# Patient Record
Sex: Male | Born: 1935 | Race: White | Hispanic: No | Smoking: Never smoker
Health system: Southern US, Community
[De-identification: ages and names within clinical notes are randomized; demographics above are authoritative.]

## PROBLEM LIST (undated history)

## (undated) DIAGNOSIS — Z950 Presence of cardiac pacemaker: Secondary | ICD-10-CM

## (undated) DIAGNOSIS — I4891 Unspecified atrial fibrillation: Principal | ICD-10-CM

## (undated) DIAGNOSIS — I10 Essential (primary) hypertension: Secondary | ICD-10-CM

## (undated) DIAGNOSIS — Z21 Asymptomatic human immunodeficiency virus [HIV] infection status: Secondary | ICD-10-CM

## (undated) DIAGNOSIS — D509 Iron deficiency anemia, unspecified: Secondary | ICD-10-CM

## (undated) DIAGNOSIS — E785 Hyperlipidemia, unspecified: Secondary | ICD-10-CM

## (undated) DIAGNOSIS — I701 Atherosclerosis of renal artery: Secondary | ICD-10-CM

## (undated) DIAGNOSIS — I509 Heart failure, unspecified: Secondary | ICD-10-CM

## (undated) DIAGNOSIS — E119 Type 2 diabetes mellitus without complications: Secondary | ICD-10-CM

## (undated) DIAGNOSIS — I2510 Atherosclerotic heart disease of native coronary artery without angina pectoris: Secondary | ICD-10-CM

## (undated) DIAGNOSIS — Z794 Long term (current) use of insulin: Secondary | ICD-10-CM

## (undated) DIAGNOSIS — Z8679 Personal history of other diseases of the circulatory system: Secondary | ICD-10-CM

## (undated) DIAGNOSIS — N189 Chronic kidney disease, unspecified: Secondary | ICD-10-CM

## (undated) DIAGNOSIS — I495 Sick sinus syndrome: Secondary | ICD-10-CM

## (undated) DIAGNOSIS — E039 Hypothyroidism, unspecified: Secondary | ICD-10-CM

## (undated) DIAGNOSIS — Z8719 Personal history of other diseases of the digestive system: Secondary | ICD-10-CM

## (undated) DIAGNOSIS — K2970 Gastritis, unspecified, without bleeding: Secondary | ICD-10-CM

## (undated) HISTORY — DX: Asymptomatic human immunodeficiency virus (hiv) infection status: Z21

## (undated) HISTORY — DX: Sick sinus syndrome: I49.5

## (undated) HISTORY — DX: Heart failure, unspecified: I50.9

## (undated) HISTORY — DX: Atherosclerotic heart disease of native coronary artery without angina pectoris: I25.10

## (undated) HISTORY — DX: Long term (current) use of insulin: Z79.4

## (undated) HISTORY — DX: Hyperlipidemia, unspecified: E78.5

## (undated) HISTORY — DX: Atherosclerosis of renal artery: I70.1

## (undated) HISTORY — DX: Essential (primary) hypertension: I10

## (undated) HISTORY — PX: INSERT / REPLACE / REMOVE PACEMAKER: SUR710

## (undated) HISTORY — PX: RENAL ARTERY STENT: SHX2321

## (undated) HISTORY — DX: Gastritis, unspecified, without bleeding: K29.70

## (undated) HISTORY — PX: ABLATION SAPHENOUS VEIN W/ RFA: SUR11

## (undated) HISTORY — DX: Chronic kidney disease, unspecified: N18.9

## (undated) HISTORY — DX: Type 2 diabetes mellitus without complications: E11.9

## (undated) HISTORY — DX: Personal history of other diseases of the circulatory system: Z86.79

## (undated) HISTORY — DX: Personal history of other diseases of the digestive system: Z87.19

## (undated) HISTORY — DX: Hypothyroidism, unspecified: E03.9

## (undated) HISTORY — DX: Iron deficiency anemia, unspecified: D50.9

---

## 1999-09-13 ENCOUNTER — Encounter: Payer: Self-pay | Admitting: Emergency Medicine

## 1999-09-13 ENCOUNTER — Inpatient Hospital Stay (HOSPITAL_COMMUNITY): Admission: EM | Admit: 1999-09-13 | Discharge: 1999-09-13 | Payer: Self-pay | Admitting: Emergency Medicine

## 1999-09-23 ENCOUNTER — Encounter: Admission: RE | Admit: 1999-09-23 | Discharge: 1999-09-23 | Payer: Self-pay | Admitting: Hematology and Oncology

## 1999-10-06 ENCOUNTER — Emergency Department (HOSPITAL_COMMUNITY): Admission: EM | Admit: 1999-10-06 | Discharge: 1999-10-06 | Payer: Self-pay | Admitting: Emergency Medicine

## 1999-10-20 ENCOUNTER — Encounter: Admission: RE | Admit: 1999-10-20 | Discharge: 1999-10-20 | Payer: Self-pay | Admitting: Internal Medicine

## 1999-10-28 ENCOUNTER — Encounter: Admission: RE | Admit: 1999-10-28 | Discharge: 1999-10-28 | Payer: Self-pay | Admitting: Internal Medicine

## 2000-05-20 ENCOUNTER — Emergency Department (HOSPITAL_COMMUNITY): Admission: EM | Admit: 2000-05-20 | Discharge: 2000-05-20 | Payer: Self-pay | Admitting: Emergency Medicine

## 2000-09-26 ENCOUNTER — Emergency Department (HOSPITAL_COMMUNITY): Admission: EM | Admit: 2000-09-26 | Discharge: 2000-09-26 | Payer: Self-pay | Admitting: Emergency Medicine

## 2001-10-05 ENCOUNTER — Inpatient Hospital Stay (HOSPITAL_COMMUNITY): Admission: EM | Admit: 2001-10-05 | Discharge: 2001-10-09 | Payer: Self-pay

## 2001-10-05 ENCOUNTER — Encounter: Payer: Self-pay | Admitting: *Deleted

## 2001-10-05 ENCOUNTER — Encounter: Payer: Self-pay | Admitting: Emergency Medicine

## 2001-10-14 ENCOUNTER — Encounter: Admission: RE | Admit: 2001-10-14 | Discharge: 2001-10-14 | Payer: Self-pay | Admitting: Internal Medicine

## 2001-10-17 ENCOUNTER — Encounter: Admission: RE | Admit: 2001-10-17 | Discharge: 2001-10-17 | Payer: Self-pay

## 2001-10-22 ENCOUNTER — Encounter: Admission: RE | Admit: 2001-10-22 | Discharge: 2001-10-22 | Payer: Self-pay | Admitting: Internal Medicine

## 2001-10-29 ENCOUNTER — Encounter: Admission: RE | Admit: 2001-10-29 | Discharge: 2001-10-29 | Payer: Self-pay | Admitting: Internal Medicine

## 2002-02-27 ENCOUNTER — Encounter: Admission: RE | Admit: 2002-02-27 | Discharge: 2002-02-27 | Payer: Self-pay | Admitting: Orthopedic Surgery

## 2002-02-27 ENCOUNTER — Encounter: Payer: Self-pay | Admitting: Orthopedic Surgery

## 2004-08-23 ENCOUNTER — Emergency Department (HOSPITAL_COMMUNITY): Admission: EM | Admit: 2004-08-23 | Discharge: 2004-08-24 | Payer: Self-pay | Admitting: Emergency Medicine

## 2004-09-03 ENCOUNTER — Emergency Department (HOSPITAL_COMMUNITY): Admission: EM | Admit: 2004-09-03 | Discharge: 2004-09-03 | Payer: Self-pay | Admitting: *Deleted

## 2004-09-08 ENCOUNTER — Ambulatory Visit (HOSPITAL_COMMUNITY): Admission: RE | Admit: 2004-09-08 | Discharge: 2004-09-09 | Payer: Self-pay | Admitting: Cardiology

## 2005-01-23 ENCOUNTER — Encounter: Admission: RE | Admit: 2005-01-23 | Discharge: 2005-01-23 | Payer: Self-pay | Admitting: Vascular Surgery

## 2005-02-06 ENCOUNTER — Ambulatory Visit (HOSPITAL_COMMUNITY): Admission: RE | Admit: 2005-02-06 | Discharge: 2005-02-06 | Payer: Self-pay | Admitting: Vascular Surgery

## 2005-08-23 ENCOUNTER — Encounter: Admission: RE | Admit: 2005-08-23 | Discharge: 2005-08-23 | Payer: Self-pay | Admitting: Vascular Surgery

## 2005-08-28 ENCOUNTER — Emergency Department (HOSPITAL_COMMUNITY): Admission: EM | Admit: 2005-08-28 | Discharge: 2005-08-28 | Payer: Self-pay | Admitting: Emergency Medicine

## 2005-09-30 ENCOUNTER — Emergency Department (HOSPITAL_COMMUNITY): Admission: EM | Admit: 2005-09-30 | Discharge: 2005-09-30 | Payer: Self-pay | Admitting: Emergency Medicine

## 2006-02-14 ENCOUNTER — Encounter: Admission: RE | Admit: 2006-02-14 | Discharge: 2006-02-14 | Payer: Self-pay | Admitting: Vascular Surgery

## 2006-03-03 ENCOUNTER — Inpatient Hospital Stay (HOSPITAL_COMMUNITY): Admission: EM | Admit: 2006-03-03 | Discharge: 2006-03-07 | Payer: Self-pay | Admitting: Emergency Medicine

## 2006-03-06 HISTORY — PX: CORONARY ANGIOPLASTY: SHX604

## 2006-05-14 ENCOUNTER — Encounter: Admission: RE | Admit: 2006-05-14 | Discharge: 2006-05-14 | Payer: Self-pay | Admitting: Nephrology

## 2006-07-02 ENCOUNTER — Inpatient Hospital Stay (HOSPITAL_COMMUNITY): Admission: EM | Admit: 2006-07-02 | Discharge: 2006-07-05 | Payer: Self-pay | Admitting: *Deleted

## 2006-07-02 ENCOUNTER — Encounter: Payer: Self-pay | Admitting: Cardiology

## 2006-09-20 ENCOUNTER — Ambulatory Visit (HOSPITAL_COMMUNITY): Admission: RE | Admit: 2006-09-20 | Discharge: 2006-09-20 | Payer: Self-pay | Admitting: Gastroenterology

## 2006-12-05 ENCOUNTER — Encounter: Payer: Self-pay | Admitting: Cardiology

## 2006-12-05 ENCOUNTER — Ambulatory Visit: Payer: Self-pay | Admitting: Internal Medicine

## 2006-12-05 ENCOUNTER — Inpatient Hospital Stay (HOSPITAL_COMMUNITY): Admission: EM | Admit: 2006-12-05 | Discharge: 2006-12-10 | Payer: Self-pay | Admitting: Emergency Medicine

## 2006-12-06 ENCOUNTER — Encounter: Payer: Self-pay | Admitting: Cardiology

## 2009-02-19 ENCOUNTER — Inpatient Hospital Stay (HOSPITAL_COMMUNITY): Admission: EM | Admit: 2009-02-19 | Discharge: 2009-03-05 | Payer: Self-pay | Admitting: Emergency Medicine

## 2009-02-22 HISTORY — PX: CARDIAC CATHETERIZATION: SHX172

## 2009-02-22 HISTORY — PX: CORONARY ARTERY BYPASS GRAFT: SHX141

## 2009-02-23 ENCOUNTER — Ambulatory Visit: Payer: Self-pay | Admitting: Surgery

## 2009-02-23 ENCOUNTER — Encounter: Payer: Self-pay | Admitting: Cardiology

## 2009-03-30 ENCOUNTER — Encounter: Admission: RE | Admit: 2009-03-30 | Discharge: 2009-03-30 | Payer: Self-pay | Admitting: Surgery

## 2009-03-30 ENCOUNTER — Ambulatory Visit: Payer: Self-pay | Admitting: Surgery

## 2009-04-04 ENCOUNTER — Inpatient Hospital Stay (HOSPITAL_COMMUNITY): Admission: EM | Admit: 2009-04-04 | Discharge: 2009-04-06 | Payer: Self-pay | Admitting: Emergency Medicine

## 2009-04-05 ENCOUNTER — Encounter (INDEPENDENT_AMBULATORY_CARE_PROVIDER_SITE_OTHER): Payer: Self-pay | Admitting: Interventional Cardiology

## 2009-06-09 ENCOUNTER — Inpatient Hospital Stay (HOSPITAL_COMMUNITY): Admission: EM | Admit: 2009-06-09 | Discharge: 2009-06-16 | Payer: Self-pay | Admitting: Emergency Medicine

## 2009-06-13 ENCOUNTER — Encounter: Payer: Self-pay | Admitting: Internal Medicine

## 2009-06-13 ENCOUNTER — Encounter (INDEPENDENT_AMBULATORY_CARE_PROVIDER_SITE_OTHER): Payer: Self-pay | Admitting: Gastroenterology

## 2009-07-10 ENCOUNTER — Inpatient Hospital Stay (HOSPITAL_COMMUNITY): Admission: EM | Admit: 2009-07-10 | Discharge: 2009-07-12 | Payer: Self-pay | Admitting: Emergency Medicine

## 2009-07-10 ENCOUNTER — Ambulatory Visit: Payer: Self-pay | Admitting: Internal Medicine

## 2009-07-27 ENCOUNTER — Encounter (HOSPITAL_COMMUNITY): Admission: RE | Admit: 2009-07-27 | Discharge: 2009-09-17 | Payer: Self-pay | Admitting: Nephrology

## 2009-11-03 ENCOUNTER — Encounter (HOSPITAL_COMMUNITY): Admission: RE | Admit: 2009-11-03 | Discharge: 2010-02-01 | Payer: Self-pay | Admitting: Nephrology

## 2009-12-17 IMAGING — CR DG CHEST 1V PORT
1 series · 1 of 1 positions shown · non-contrast
Comparison: 03/01/2009 at 9577 hours.

CLINICAL DATA: Unstable angina.  Repeat cardiac surgery.

PORTABLE CHEST - 1 VIEW

[view not recorded]
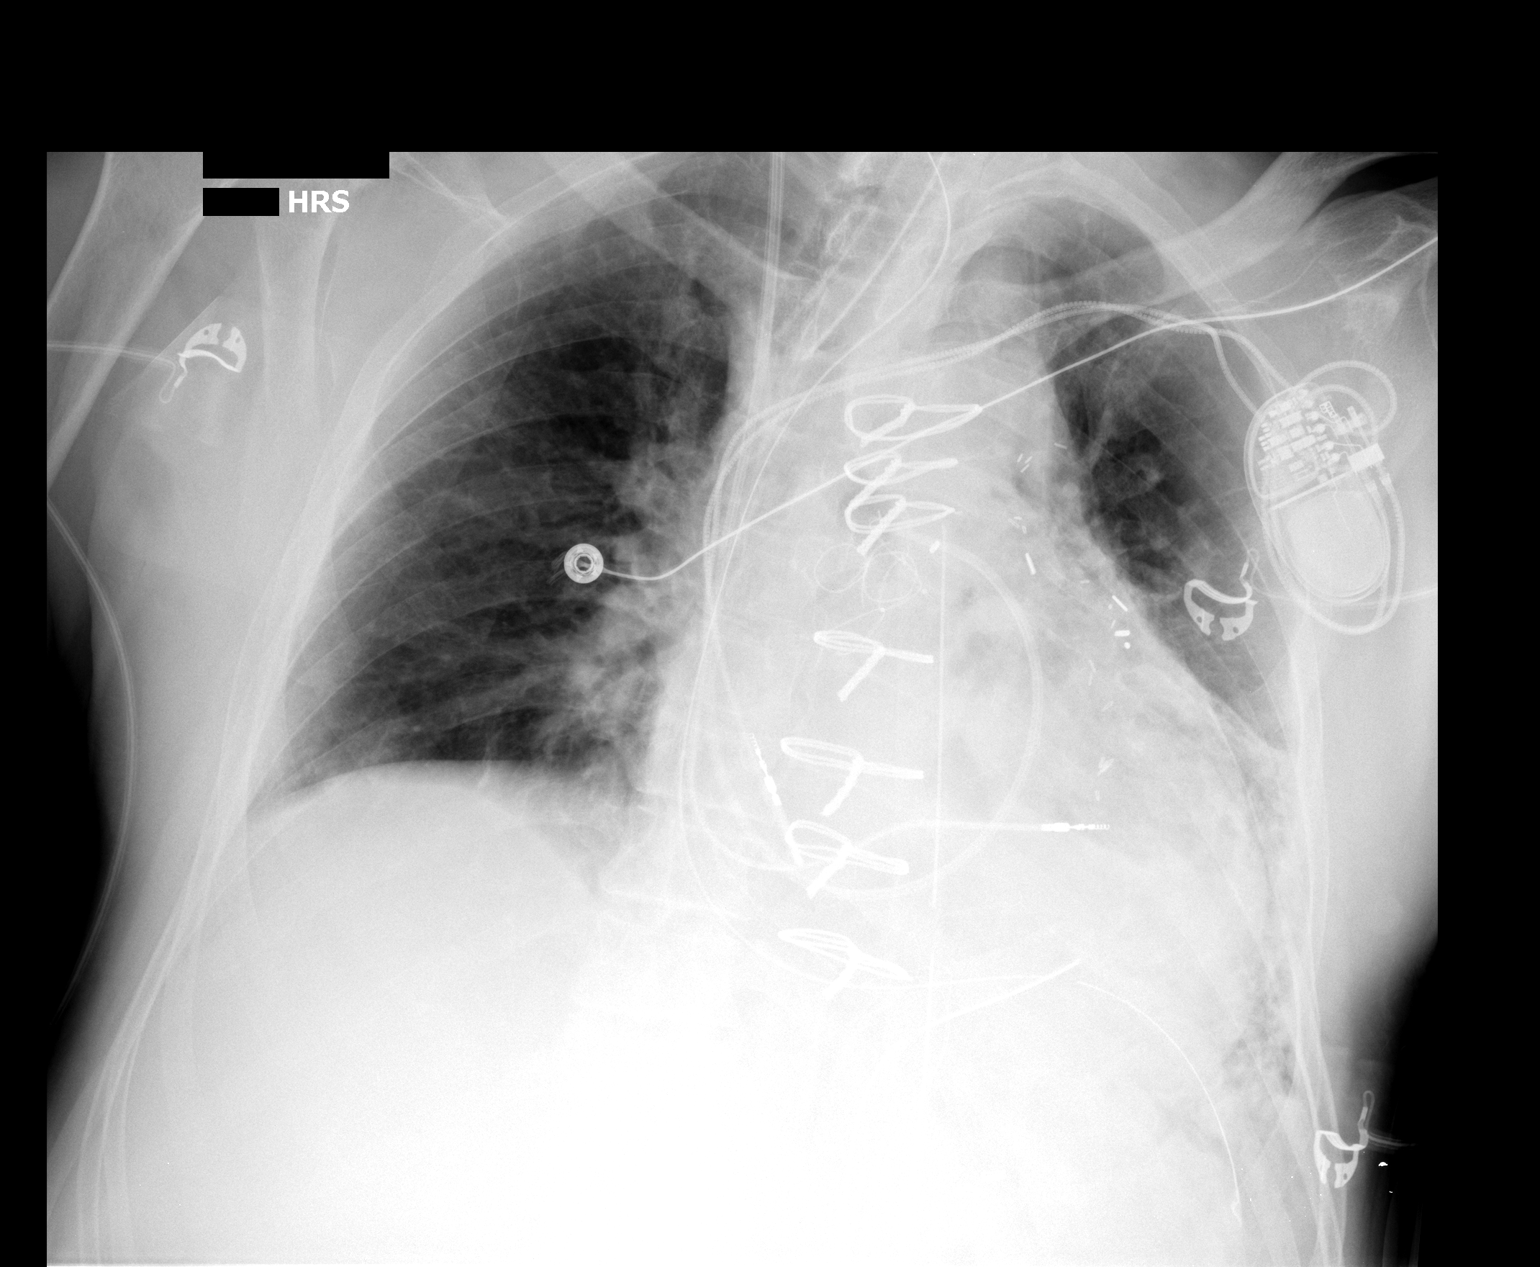

[1 of 1 positions shown; findings below may reference images not displayed]

FINDINGS: The support apparatus is stable.  No pneumothorax is
seen.  Persistent left lower lobe atelectasis.
IMPRESSION: 1.  Stable support apparatus which is in good position without
complicating features.
2.  No pneumothorax.
3.  Persistent left lower lobe atelectasis.

## 2009-12-17 IMAGING — CR DG CHEST 1V PORT
1 series · 1 of 1 positions shown · non-contrast
Comparison: 02/26/2009

CLINICAL DATA: Searching for a surgical needle.

PORTABLE CHEST - 1 VIEW

[view not recorded]
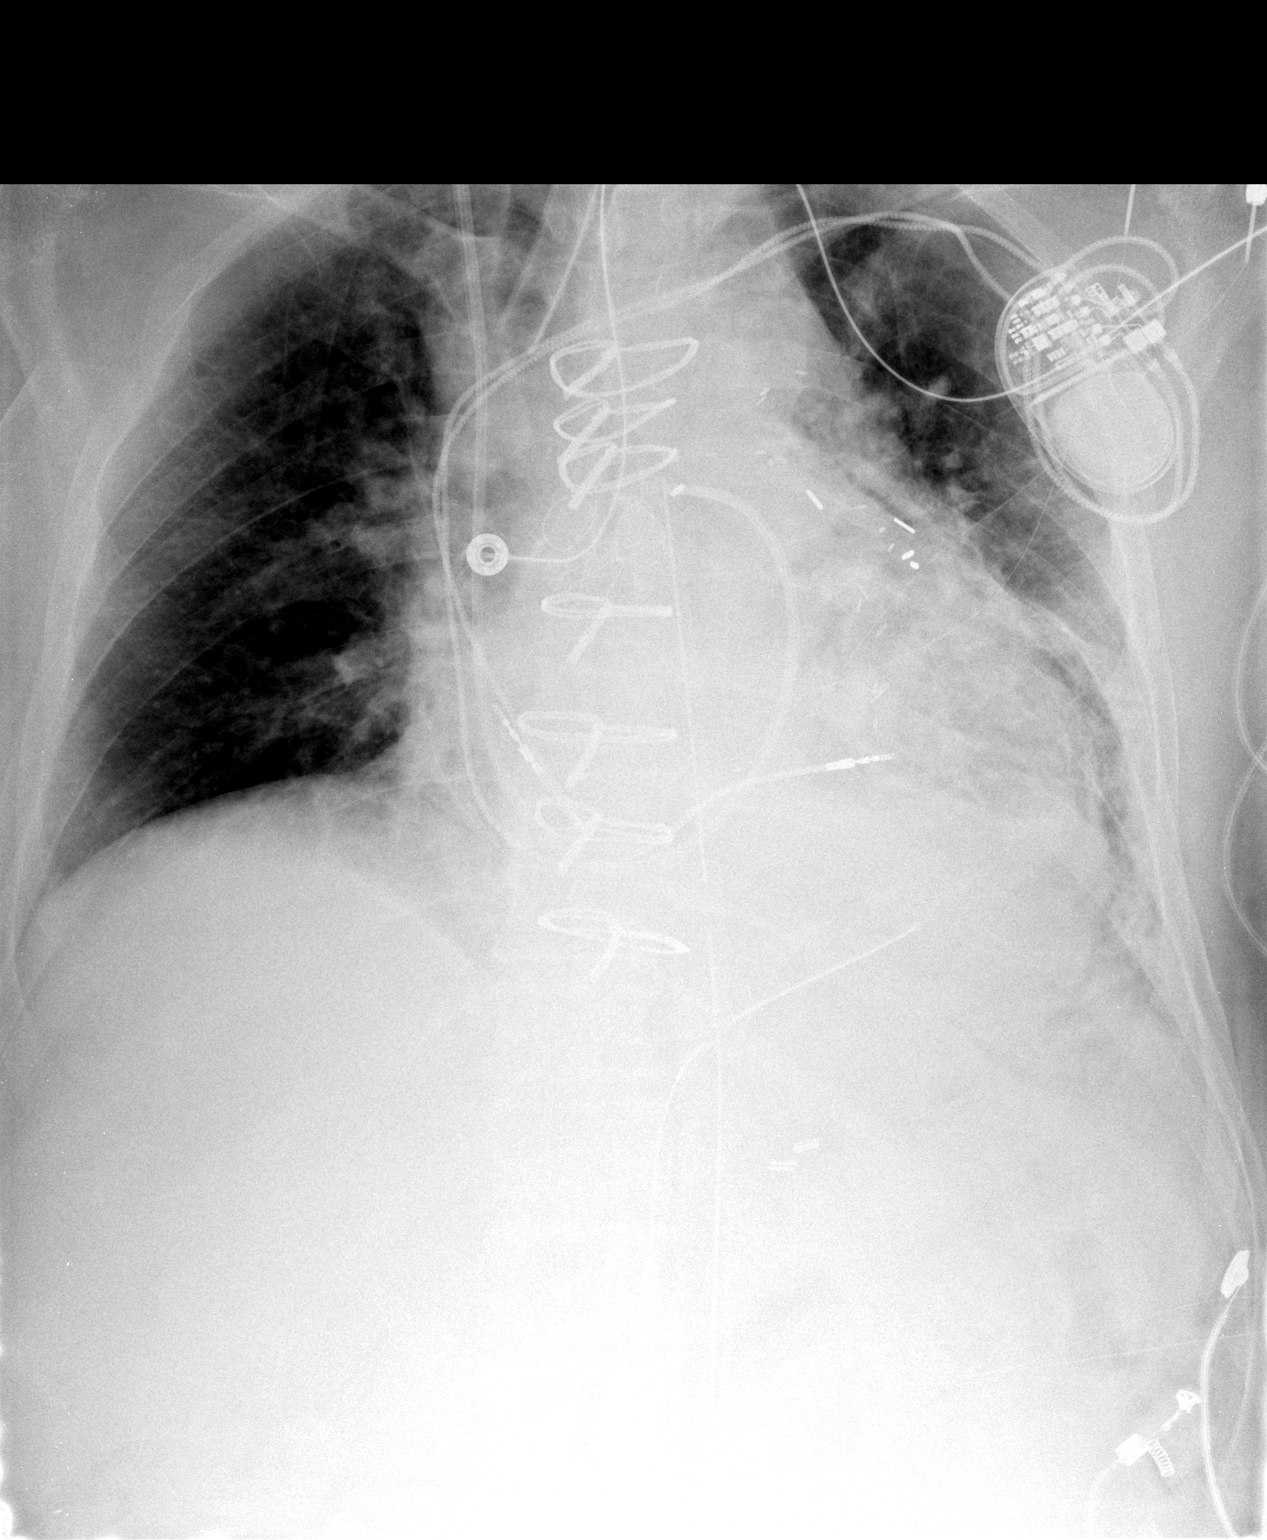

[1 of 1 positions shown; findings below may reference images not displayed]

FINDINGS: Endotracheal tube is 3.8 cm above the carina and pointing
towards the right side of the trachea.  The patient has a
mediastinal and left basilar chest tube.  Marked volume loss in the
left lung base.  The patient has a left cardiac pacemaker.  No
evidence for a radiopaque surgical needle.
IMPRESSION: No evidence for a radiopaque surgical needle.

Support apparatuses as described.

Volume loss in the left lung base.

## 2009-12-18 IMAGING — CR DG CHEST 1V PORT
1 series · 1 of 1 positions shown · non-contrast
Comparison: 03/01/2009.

CLINICAL DATA: Unstable angina.  Status post CABG.

PORTABLE CHEST - 1 VIEW

[view not recorded]
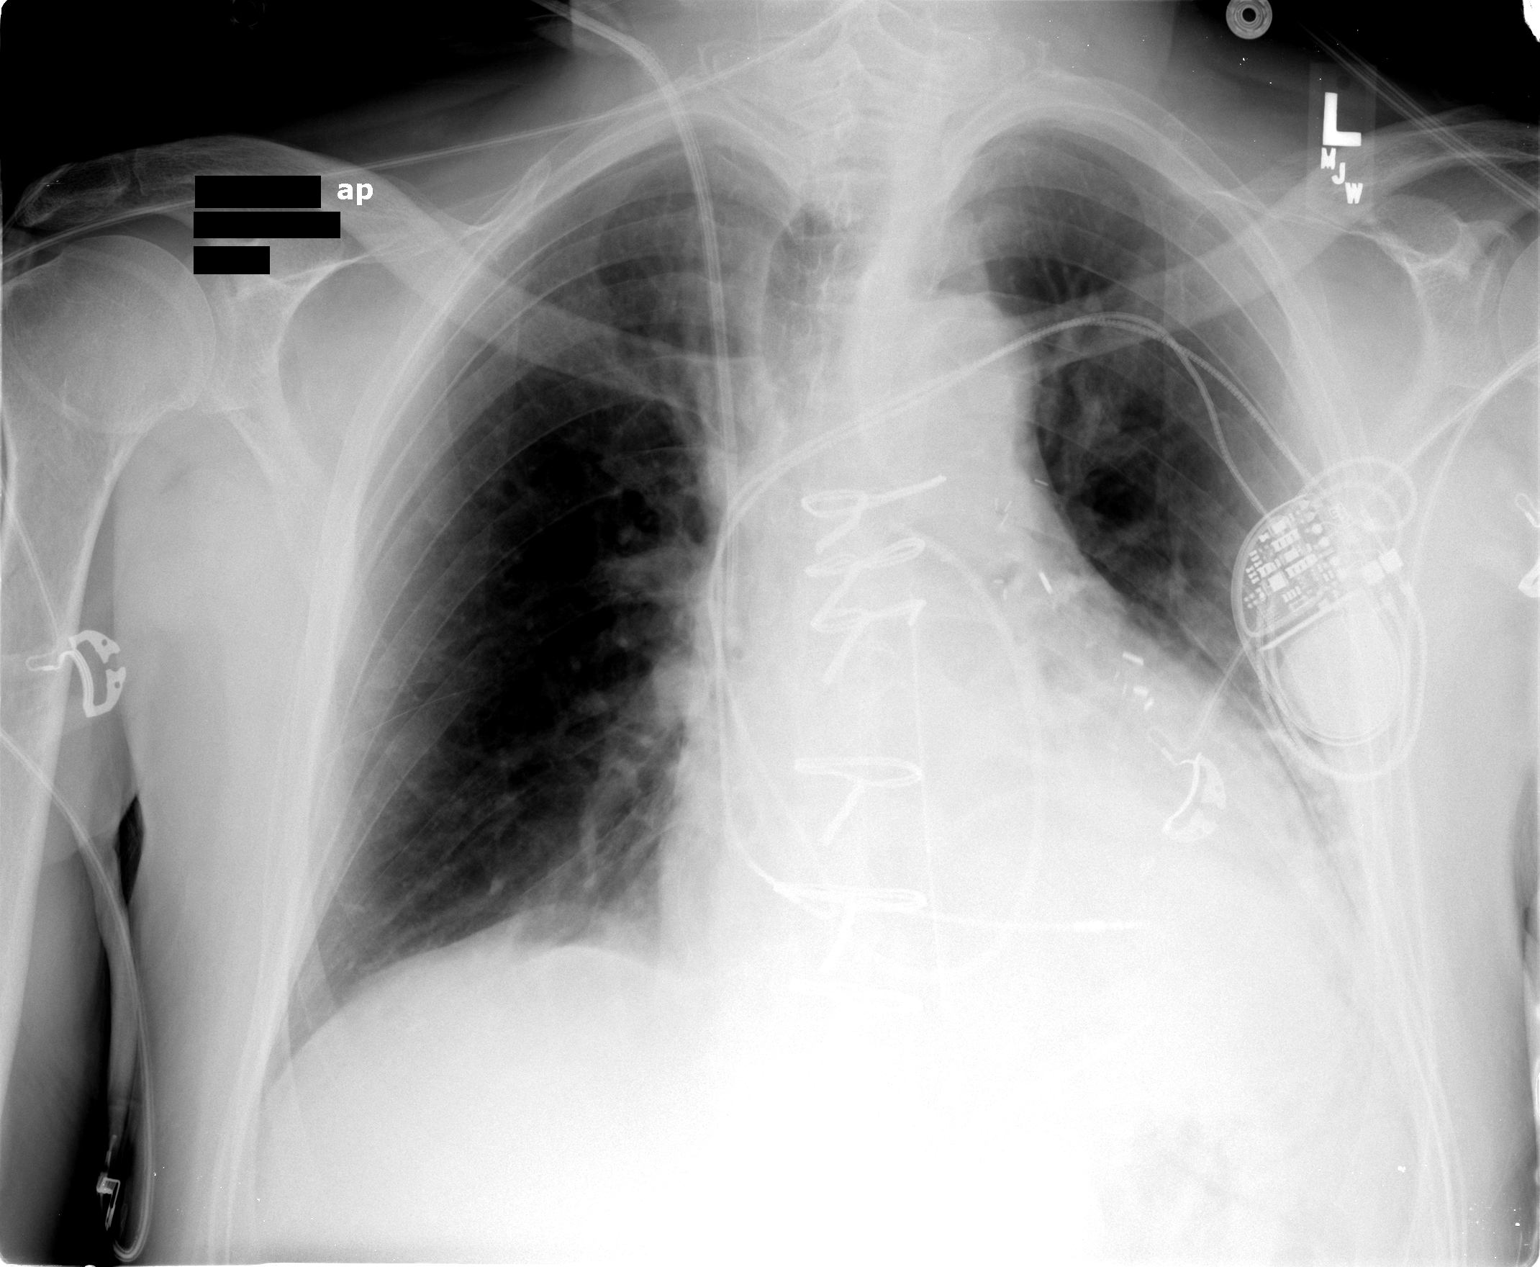

[1 of 1 positions shown; findings below may reference images not displayed]

FINDINGS: Sequential pacemaker is in place entering from the left
with leads unchanged in position.  Endotracheal tube and
nasogastric tube have been removed.  Right-sided Swan-Ganz catheter
tip main pulmonary artery.  Mediastinal drains in place. Mild
cardiomegaly.  Basilar atelectasis.  Mild central pulmonary
vascular prominence.
IMPRESSION: Endotracheal tube and nasogastric tube have been removed.
Remainder of findings unchanged.

## 2009-12-19 IMAGING — CR DG CHEST 2V
2 series · 2 of 2 positions shown · non-contrast
Comparison: 03/02/2009 study

CLINICAL DATA: History given of coronary bypass grafting and
history of unstable angina.

CHEST - 2 VIEW

[w chest pa]
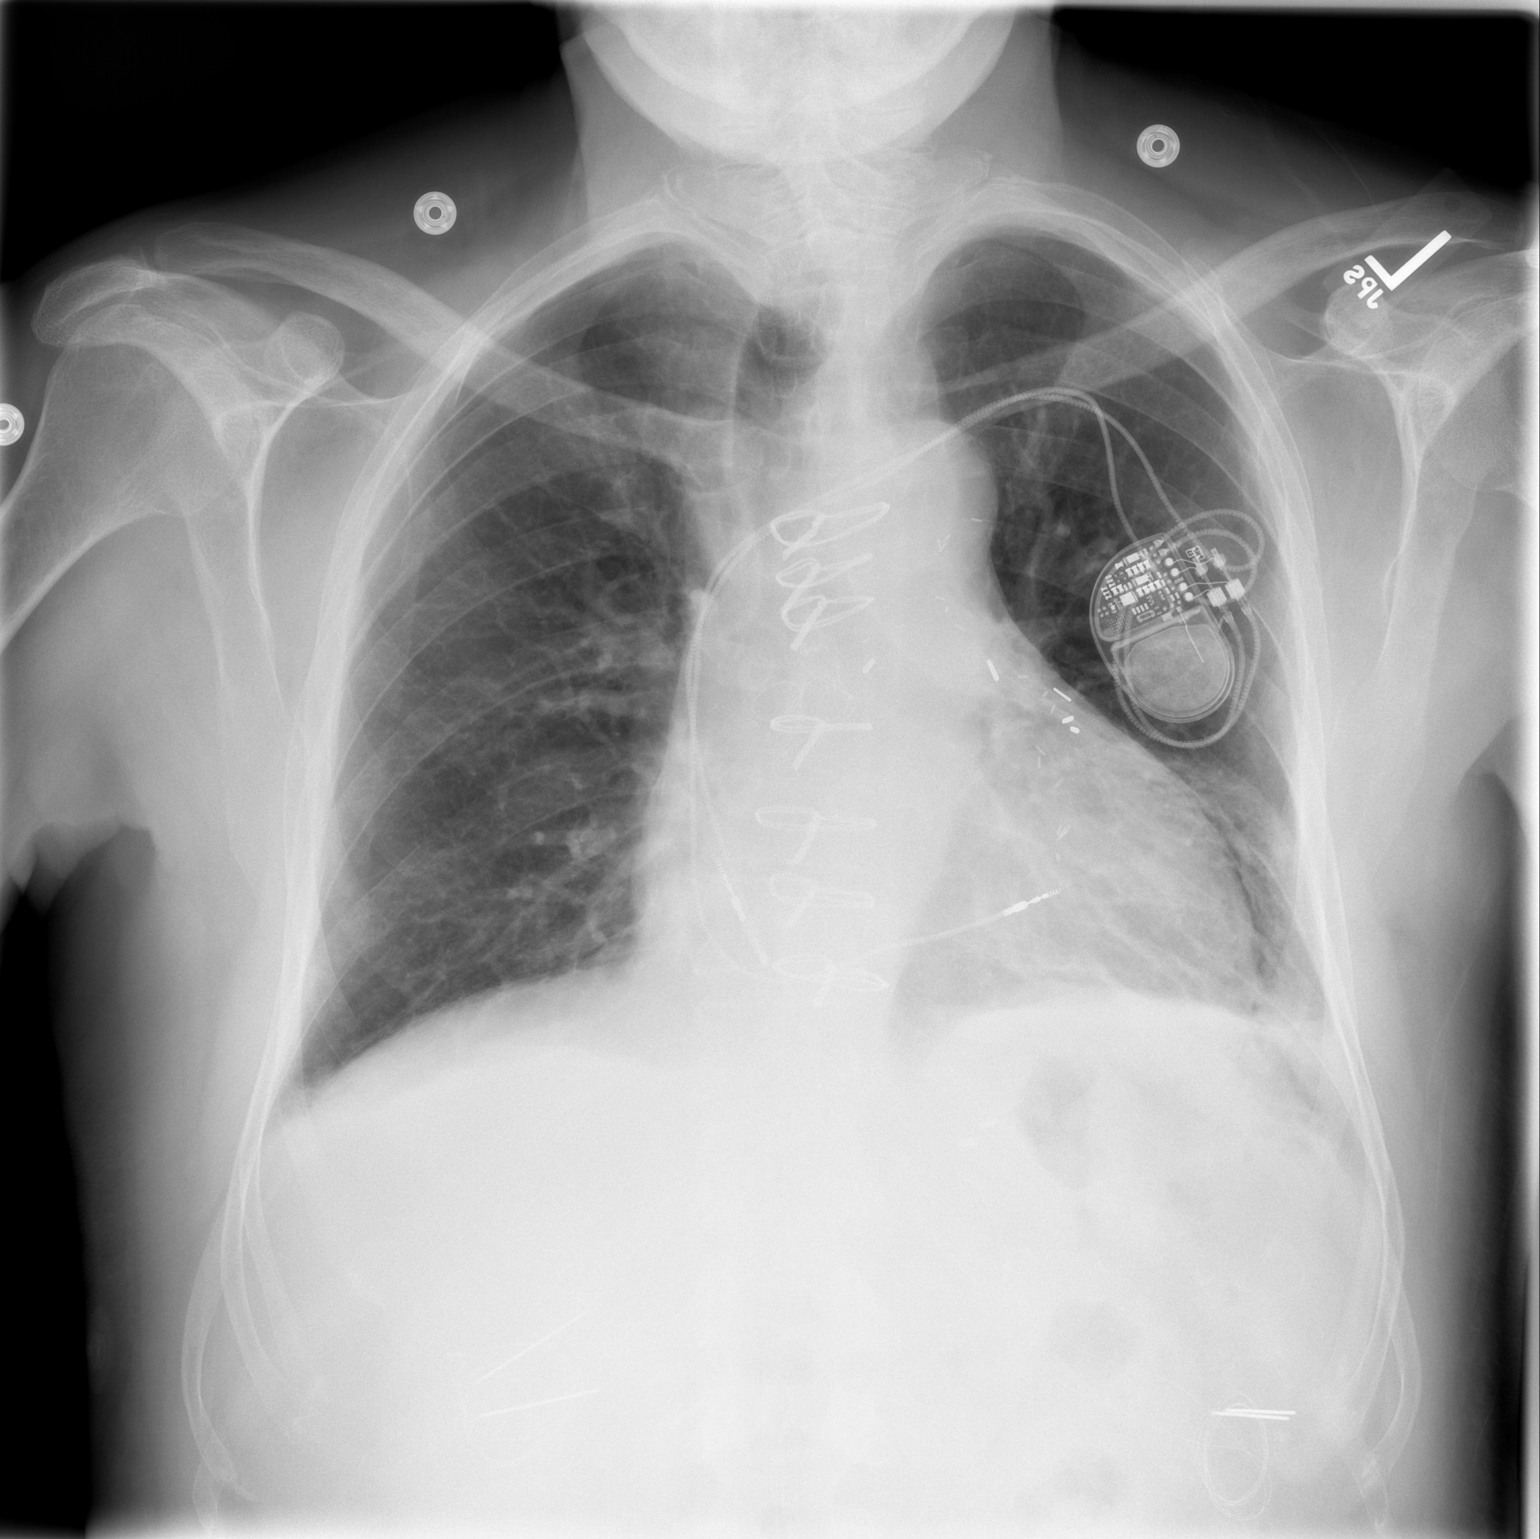

[w chest lat]
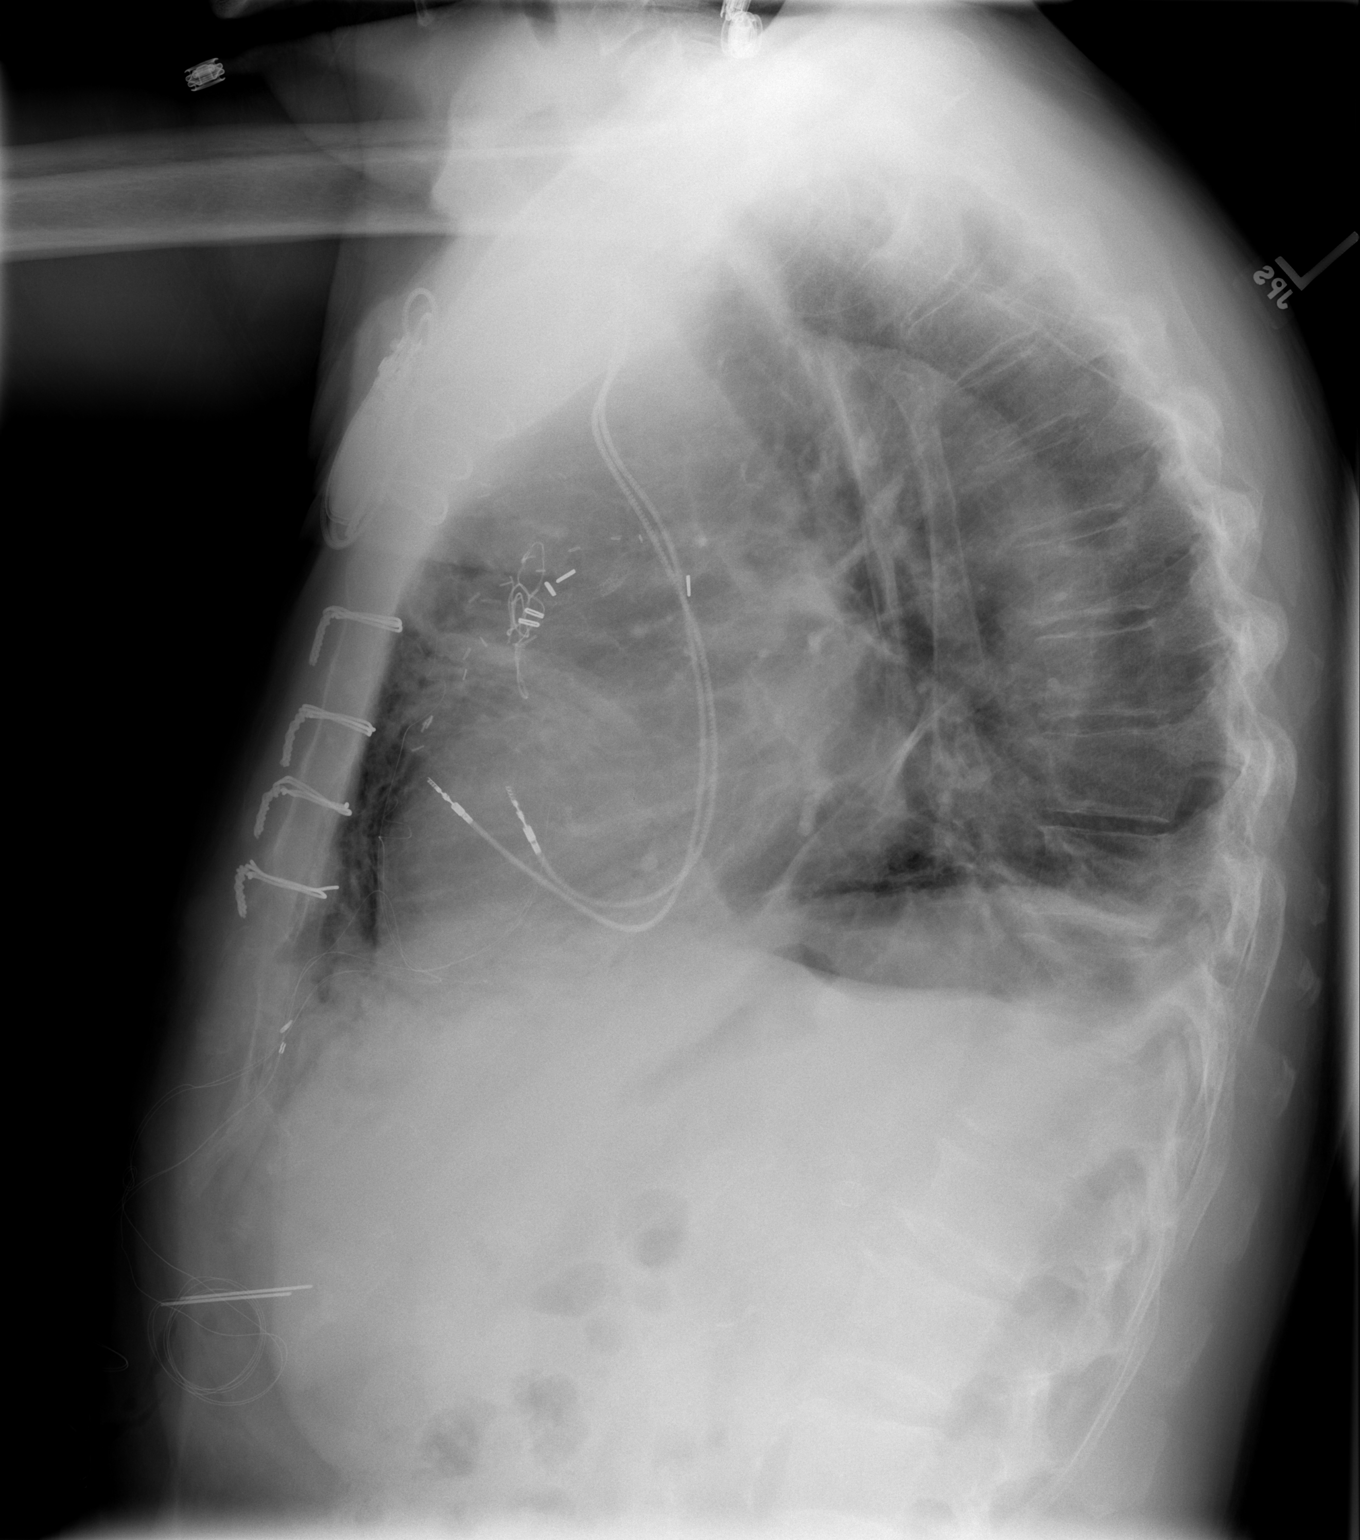

[2 of 2 positions shown; findings below may reference images not displayed]

FINDINGS: Previous median sternotomy and coronary artery bypass
grafting have been performed.  There is mild enlargement of the
cardiac silhouette.  Patchy infiltrative density and atelectasis is
seen in the left base with improvement since previous study.  Right
lung is free of infiltrates.  No pneumothorax is evident.  Dual
lead transvenous pacemaker is in place with controller device on
the left.  Since the previous study the Swan-Ganz catheter and
mediastinal drain have been removed.
IMPRESSION: Patchy infiltrative density and atelectasis is seen in the left
base with improvement since previous study.

## 2010-08-08 ENCOUNTER — Ambulatory Visit: Payer: Self-pay | Admitting: Cardiology

## 2010-10-11 ENCOUNTER — Ambulatory Visit: Payer: Self-pay | Admitting: Cardiology

## 2010-11-17 ENCOUNTER — Encounter: Payer: Self-pay | Admitting: Internal Medicine

## 2011-01-10 ENCOUNTER — Ambulatory Visit: Admit: 2011-01-10 | Payer: Self-pay | Admitting: Internal Medicine

## 2011-01-15 ENCOUNTER — Encounter: Payer: Self-pay | Admitting: Vascular Surgery

## 2011-01-24 NOTE — Miscellaneous (Signed)
Summary: Device preload  Clinical Lists Changes  Observations: Added new observation of PPM INDICATN: A-flutter (11/17/2010 10:00) Added new observation of MAGNET RTE: BOL 85 ERI 65 (11/17/2010 10:00) Added new observation of PPMLEADSTAT2: active (11/17/2010 10:00) Added new observation of PPMLEADSER2: [id number] (11/17/2010 10:00) Added new observation of PPMLEADMOD2: 5076  (11/17/2010 10:00) Added new observation of PPMLEADDOI2: 07/03/2006  (11/17/2010 10:00) Added new observation of PPMLEADLOC2: RV  (11/17/2010 10:00) Added new observation of PPMLEADSTAT1: active  (11/17/2010 10:00) Added new observation of PPMLEADSER1: [id number]  (11/17/2010 10:00) Added new observation of PPMLEADMOD1: 5076  (11/17/2010 10:00) Added new observation of PPMLEADDOI1: 07/03/2006  (11/17/2010 10:00) Added new observation of PPMLEADLOC1: RA  (11/17/2010 10:00) Added new observation of PPM DOI: 07/03/2006  (11/17/2010 10:00) Added new observation of PPM SERL#: [id number]  (11/17/2010 10:00) Added new observation of PPM MODL#: [id number]  (11/17/2010 10:00) Added new observation of PACEMAKERMFG: [employer]  (11/17/2010 10:00) Added new observation of PPM IMP MD: Charlynn Court  (11/17/2010 10:00) Added new observation of PPM REFER MD: Peter Swaziland, MD  (11/17/2010 10:00) Added new observation of PACEMAKER MD: Lewayne Bunting, MD  (11/17/2010 10:00)      PPM Specifications Following MD:  Lewayne Bunting, MD     Referring MD:  Peter Swaziland, MD PPM Vendor:  [employer]onic     PPM Model Number:  [id number]     PPM Serial Number:  [id number] PPM DOI:  07/03/2006     PPM Implanting MD:  Charlynn Court  Lead 1    Location: RA     DOI: 07/03/2006     Model #: [id number]     Serial #: [id number]     Status: active Lead 2    Location: RV     DOI: 07/03/2006     Model #: [id number]     Serial #: [id number]     Status: active  Magnet Response Rate:  BOL 85 ERI 65  Indications:  A-flutter

## 2011-02-23 HISTORY — PX: OTHER SURGICAL HISTORY: SHX169

## 2011-03-23 ENCOUNTER — Encounter (INDEPENDENT_AMBULATORY_CARE_PROVIDER_SITE_OTHER): Payer: Self-pay | Admitting: *Deleted

## 2011-03-28 NOTE — Letter (Signed)
Summary: Appointment - Reminder 2  [facility], Main Office  [address]   [place], [place] [address]   Phone: [phone]  Fax: [phone]     March 23, 2011 MRN: [id number]   [name] Proctor [address] [place], [place]  [address]   Dear [name],  Our records indicate that it is past time to schedule a pacemaker check. Dr.Taylor recommended that you follow up with [place] in January. It is very important that we reach you to schedule this appointment. We look forward to participating in your health care needs. Please contact us at the number listed above at your earliest convenience to schedule your appointment.  If you are unable to make an appointment at this time, give [place] a call so we can update our records.     Sincerely,   Glass blower/designer

## 2011-04-02 LAB — GLUCOSE, CAPILLARY
Glucose-Capillary: 131 mg/dL — ABNORMAL HIGH (ref 70–99)
Glucose-Capillary: 139 mg/dL — ABNORMAL HIGH (ref 70–99)
Glucose-Capillary: 166 mg/dL — ABNORMAL HIGH (ref 70–99)
Glucose-Capillary: 168 mg/dL — ABNORMAL HIGH (ref 70–99)
Glucose-Capillary: 178 mg/dL — ABNORMAL HIGH (ref 70–99)
Glucose-Capillary: 189 mg/dL — ABNORMAL HIGH (ref 70–99)
Glucose-Capillary: 200 mg/dL — ABNORMAL HIGH (ref 70–99)

## 2011-04-02 LAB — URINALYSIS, ROUTINE W REFLEX MICROSCOPIC
Bilirubin Urine: NEGATIVE
Hgb urine dipstick: NEGATIVE
Protein, ur: 100 mg/dL — AB
Urobilinogen, UA: 0.2 mg/dL (ref 0.0–1.0)

## 2011-04-02 LAB — BASIC METABOLIC PANEL
BUN: 43 mg/dL — ABNORMAL HIGH (ref 6–23)
BUN: 44 mg/dL — ABNORMAL HIGH (ref 6–23)
CO2: 25 mEq/L (ref 19–32)
CO2: 25 mEq/L (ref 19–32)
Calcium: 8.2 mg/dL — ABNORMAL LOW (ref 8.4–10.5)
Calcium: 8.2 mg/dL — ABNORMAL LOW (ref 8.4–10.5)
Calcium: 8.3 mg/dL — ABNORMAL LOW (ref 8.4–10.5)
Calcium: 8.4 mg/dL (ref 8.4–10.5)
Chloride: 100 mEq/L (ref 96–112)
Chloride: 99 mEq/L (ref 96–112)
Creatinine, Ser: 2.28 mg/dL — ABNORMAL HIGH (ref 0.4–1.5)
Creatinine, Ser: 2.38 mg/dL — ABNORMAL HIGH (ref 0.4–1.5)
Creatinine, Ser: 2.48 mg/dL — ABNORMAL HIGH (ref 0.4–1.5)
GFR calc Af Amer: 31 mL/min — ABNORMAL LOW (ref >60)
GFR calc Af Amer: 33 mL/min — ABNORMAL LOW (ref >60)
GFR calc Af Amer: 33 mL/min — ABNORMAL LOW (ref >60)
GFR calc Af Amer: 34 mL/min — ABNORMAL LOW (ref >60)
GFR calc Af Amer: 35 mL/min — ABNORMAL LOW (ref >60)
GFR calc Af Amer: 35 mL/min — ABNORMAL LOW (ref >60)
GFR calc non Af Amer: 26 mL/min — ABNORMAL LOW (ref >60)
GFR calc non Af Amer: 27 mL/min — ABNORMAL LOW (ref >60)
GFR calc non Af Amer: 28 mL/min — ABNORMAL LOW (ref >60)
GFR calc non Af Amer: 28 mL/min — ABNORMAL LOW (ref >60)
Glucose, Bld: 114 mg/dL — ABNORMAL HIGH (ref 70–99)
Glucose, Bld: 159 mg/dL — ABNORMAL HIGH (ref 70–99)
Glucose, Bld: 53 mg/dL — ABNORMAL LOW (ref 70–99)
Potassium: 2.7 mEq/L — CL (ref 3.5–5.1)
Potassium: 2.9 mEq/L — ABNORMAL LOW (ref 3.5–5.1)
Potassium: 4 mEq/L (ref 3.5–5.1)
Sodium: 132 mEq/L — ABNORMAL LOW (ref 135–145)
Sodium: 133 mEq/L — ABNORMAL LOW (ref 135–145)
Sodium: 133 mEq/L — ABNORMAL LOW (ref 135–145)
Sodium: 134 mEq/L — ABNORMAL LOW (ref 135–145)

## 2011-04-02 LAB — FERRITIN: Ferritin: 70 ng/mL (ref 22–322)

## 2011-04-02 LAB — URINE MICROSCOPIC-ADD ON

## 2011-04-02 LAB — CBC
HCT: 29.3 % — ABNORMAL LOW (ref 39.0–52.0)
HCT: 30.2 % — ABNORMAL LOW (ref 39.0–52.0)
Hemoglobin: 10 g/dL — ABNORMAL LOW (ref 13.0–17.0)
Hemoglobin: 10.1 g/dL — ABNORMAL LOW (ref 13.0–17.0)
MCHC: 34.2 g/dL (ref 30.0–36.0)
Platelets: 136 10*3/uL — ABNORMAL LOW (ref 150–400)
Platelets: 140 10*3/uL — ABNORMAL LOW (ref 150–400)
RBC: 3.71 MIL/uL — ABNORMAL LOW (ref 4.22–5.81)
RBC: 3.79 MIL/uL — ABNORMAL LOW (ref 4.22–5.81)
RDW: 19.7 % — ABNORMAL HIGH (ref 11.5–15.5)
RDW: 20.2 % — ABNORMAL HIGH (ref 11.5–15.5)
WBC: 7.1 10*3/uL (ref 4.0–10.5)

## 2011-04-02 LAB — PHOSPHORUS
Phosphorus: 3.2 mg/dL (ref 2.3–4.6)
Phosphorus: 4 mg/dL (ref 2.3–4.6)

## 2011-04-02 LAB — DIFFERENTIAL
Basophils Absolute: 0 10*3/uL (ref 0.0–0.1)
Basophils Relative: 1 % (ref 0–1)
Eosinophils Relative: 4 % (ref 0–5)
Lymphocytes Relative: 32 % (ref 12–46)
Monocytes Absolute: 1 10*3/uL (ref 0.1–1.0)
Monocytes Relative: 13 % — ABNORMAL HIGH (ref 3–12)
Neutro Abs: 3.8 10*3/uL (ref 1.7–7.7)

## 2011-04-02 LAB — IRON AND TIBC: Iron: 24 ug/dL — ABNORMAL LOW (ref 42–135)

## 2011-04-02 LAB — T4, FREE: Free T4: 0.81 ng/dL (ref 0.80–1.80)

## 2011-04-02 LAB — VITAMIN B12: Vitamin B-12: 548 pg/mL (ref 211–911)

## 2011-04-02 LAB — HEPATIC FUNCTION PANEL
AST: 36 U/L (ref 0–37)
Albumin: 2.7 g/dL — ABNORMAL LOW (ref 3.5–5.2)
Total Protein: 7.5 g/dL (ref 6.0–8.3)

## 2011-04-02 LAB — CREATININE, URINE, RANDOM: Creatinine, Urine: 118.1 mg/dL

## 2011-04-02 LAB — MAGNESIUM
Magnesium: 2.5 mg/dL (ref 1.5–2.5)
Magnesium: 2.6 mg/dL — ABNORMAL HIGH (ref 1.5–2.5)

## 2011-04-02 LAB — RETICULOCYTES
RBC.: 3.75 MIL/uL — ABNORMAL LOW (ref 4.22–5.81)
Retic Count, Absolute: 71.3 10*3/uL (ref 19.0–186.0)

## 2011-04-03 LAB — URINE MICROSCOPIC-ADD ON

## 2011-04-03 LAB — RENAL FUNCTION PANEL
Albumin: 2.6 g/dL — ABNORMAL LOW (ref 3.5–5.2)
BUN: 96 mg/dL — ABNORMAL HIGH (ref 6–23)
Calcium: 8.8 mg/dL (ref 8.4–10.5)
Glucose, Bld: 127 mg/dL — ABNORMAL HIGH (ref 70–99)
Phosphorus: 5.9 mg/dL — ABNORMAL HIGH (ref 2.3–4.6)
Potassium: 3.4 mEq/L — ABNORMAL LOW (ref 3.5–5.1)

## 2011-04-03 LAB — PHOSPHORUS: Phosphorus: 8 mg/dL — ABNORMAL HIGH (ref 2.3–4.6)

## 2011-04-03 LAB — BASIC METABOLIC PANEL
BUN: 103 mg/dL — ABNORMAL HIGH (ref 6–23)
BUN: 106 mg/dL — ABNORMAL HIGH (ref 6–23)
BUN: 75 mg/dL — ABNORMAL HIGH (ref 6–23)
BUN: 83 mg/dL — ABNORMAL HIGH (ref 6–23)
CO2: 28 mEq/L (ref 19–32)
Calcium: 7.9 mg/dL — ABNORMAL LOW (ref 8.4–10.5)
Calcium: 8.3 mg/dL — ABNORMAL LOW (ref 8.4–10.5)
Calcium: 8.7 mg/dL (ref 8.4–10.5)
Calcium: 9 mg/dL (ref 8.4–10.5)
Chloride: 100 mEq/L (ref 96–112)
Chloride: 98 mEq/L (ref 96–112)
Creatinine, Ser: 3.53 mg/dL — ABNORMAL HIGH (ref 0.4–1.5)
Creatinine, Ser: 3.89 mg/dL — ABNORMAL HIGH (ref 0.4–1.5)
Creatinine, Ser: 5.03 mg/dL — ABNORMAL HIGH (ref 0.4–1.5)
Creatinine, Ser: 5.14 mg/dL — ABNORMAL HIGH (ref 0.4–1.5)
Creatinine, Ser: 5.29 mg/dL — ABNORMAL HIGH (ref 0.4–1.5)
GFR calc Af Amer: 13 mL/min — ABNORMAL LOW (ref >60)
GFR calc Af Amer: 13 mL/min — ABNORMAL LOW (ref >60)
GFR calc Af Amer: 14 mL/min — ABNORMAL LOW (ref >60)
GFR calc Af Amer: 19 mL/min — ABNORMAL LOW (ref >60)
GFR calc non Af Amer: 11 mL/min — ABNORMAL LOW (ref >60)
GFR calc non Af Amer: 11 mL/min — ABNORMAL LOW (ref >60)
GFR calc non Af Amer: 15 mL/min — ABNORMAL LOW (ref >60)

## 2011-04-03 LAB — HEMOCCULT GUIAC POC 1CARD (OFFICE): Fecal Occult Bld: POSITIVE

## 2011-04-03 LAB — HIV-1 RNA QUANT-NO REFLEX-BLD
HIV 1 RNA Quant: 5370 copies/mL — ABNORMAL HIGH (ref <48)
HIV-1 RNA Quant, Log: 3.73 {Log} — ABNORMAL HIGH (ref <1.68)

## 2011-04-03 LAB — CBC
HCT: 34.3 % — ABNORMAL LOW (ref 39.0–52.0)
Hemoglobin: 11.5 g/dL — ABNORMAL LOW (ref 13.0–17.0)
MCHC: 32.9 g/dL (ref 30.0–36.0)
MCHC: 33.3 g/dL (ref 30.0–36.0)
MCHC: 33.3 g/dL (ref 30.0–36.0)
MCHC: 33.4 g/dL (ref 30.0–36.0)
MCHC: 33.6 g/dL (ref 30.0–36.0)
MCV: 79.5 fL (ref 78.0–100.0)
MCV: 79.6 fL (ref 78.0–100.0)
MCV: 80.2 fL (ref 78.0–100.0)
Platelets: 135 10*3/uL — ABNORMAL LOW (ref 150–400)
Platelets: 174 10*3/uL (ref 150–400)
Platelets: 175 10*3/uL (ref 150–400)
Platelets: 187 10*3/uL (ref 150–400)
Platelets: 272 10*3/uL (ref 150–400)
Platelets: 298 10*3/uL (ref 150–400)
RBC: 3.88 MIL/uL — ABNORMAL LOW (ref 4.22–5.81)
RBC: 3.93 MIL/uL — ABNORMAL LOW (ref 4.22–5.81)
RBC: 4.05 MIL/uL — ABNORMAL LOW (ref 4.22–5.81)
RBC: 4.11 MIL/uL — ABNORMAL LOW (ref 4.22–5.81)
RDW: 17.4 % — ABNORMAL HIGH (ref 11.5–15.5)
RDW: 17.4 % — ABNORMAL HIGH (ref 11.5–15.5)
RDW: 17.5 % — ABNORMAL HIGH (ref 11.5–15.5)
WBC: 10.8 10*3/uL — ABNORMAL HIGH (ref 4.0–10.5)
WBC: 12.7 10*3/uL — ABNORMAL HIGH (ref 4.0–10.5)
WBC: 13 10*3/uL — ABNORMAL HIGH (ref 4.0–10.5)
WBC: 15.2 10*3/uL — ABNORMAL HIGH (ref 4.0–10.5)
WBC: 16.9 10*3/uL — ABNORMAL HIGH (ref 4.0–10.5)

## 2011-04-03 LAB — CLOSTRIDIUM DIFFICILE EIA: C difficile Toxins A+B, EIA: NEGATIVE

## 2011-04-03 LAB — URINALYSIS, ROUTINE W REFLEX MICROSCOPIC
Ketones, ur: NEGATIVE mg/dL
Nitrite: NEGATIVE
Specific Gravity, Urine: 1.02 (ref 1.005–1.030)
Urobilinogen, UA: 1 mg/dL (ref 0.0–1.0)
pH: 5 (ref 5.0–8.0)

## 2011-04-03 LAB — T-HELPER CELLS (CD4) COUNT (NOT AT ARMC)
CD4 % Helper T Cell: 14 % — ABNORMAL LOW (ref 33–55)
CD4 T Cell Abs: 730 uL (ref 400–2700)

## 2011-04-03 LAB — GLUCOSE, CAPILLARY
Glucose-Capillary: 120 mg/dL — ABNORMAL HIGH (ref 70–99)
Glucose-Capillary: 127 mg/dL — ABNORMAL HIGH (ref 70–99)
Glucose-Capillary: 136 mg/dL — ABNORMAL HIGH (ref 70–99)
Glucose-Capillary: 143 mg/dL — ABNORMAL HIGH (ref 70–99)
Glucose-Capillary: 145 mg/dL — ABNORMAL HIGH (ref 70–99)
Glucose-Capillary: 153 mg/dL — ABNORMAL HIGH (ref 70–99)
Glucose-Capillary: 158 mg/dL — ABNORMAL HIGH (ref 70–99)
Glucose-Capillary: 160 mg/dL — ABNORMAL HIGH (ref 70–99)
Glucose-Capillary: 167 mg/dL — ABNORMAL HIGH (ref 70–99)
Glucose-Capillary: 172 mg/dL — ABNORMAL HIGH (ref 70–99)
Glucose-Capillary: 184 mg/dL — ABNORMAL HIGH (ref 70–99)
Glucose-Capillary: 195 mg/dL — ABNORMAL HIGH (ref 70–99)
Glucose-Capillary: 197 mg/dL — ABNORMAL HIGH (ref 70–99)
Glucose-Capillary: 231 mg/dL — ABNORMAL HIGH (ref 70–99)

## 2011-04-03 LAB — DIFFERENTIAL
Basophils Absolute: 0 10*3/uL (ref 0.0–0.1)
Basophils Relative: 0 % (ref 0–1)
Eosinophils Absolute: 0 10*3/uL (ref 0.0–0.7)
Eosinophils Relative: 0 % (ref 0–5)
Neutrophils Relative %: 62 % (ref 43–77)

## 2011-04-03 LAB — COMPREHENSIVE METABOLIC PANEL
ALT: 120 U/L — ABNORMAL HIGH (ref 0–53)
AST: 113 U/L — ABNORMAL HIGH (ref 0–37)
AST: 138 U/L — ABNORMAL HIGH (ref 0–37)
Albumin: 2.5 g/dL — ABNORMAL LOW (ref 3.5–5.2)
Albumin: 2.6 g/dL — ABNORMAL LOW (ref 3.5–5.2)
CO2: 18 mEq/L — ABNORMAL LOW (ref 19–32)
Calcium: 8.6 mg/dL (ref 8.4–10.5)
Chloride: 97 mEq/L (ref 96–112)
Creatinine, Ser: 4.52 mg/dL — ABNORMAL HIGH (ref 0.4–1.5)
GFR calc Af Amer: 14 mL/min — ABNORMAL LOW (ref >60)
GFR calc Af Amer: 16 mL/min — ABNORMAL LOW (ref >60)
GFR calc non Af Amer: 11 mL/min — ABNORMAL LOW (ref >60)
Potassium: 5 mEq/L (ref 3.5–5.1)
Sodium: 130 mEq/L — ABNORMAL LOW (ref 135–145)
Sodium: 135 mEq/L (ref 135–145)
Total Bilirubin: 0.7 mg/dL (ref 0.3–1.2)

## 2011-04-03 LAB — HEMOGLOBIN A1C: Mean Plasma Glucose: 157 mg/dL

## 2011-04-03 LAB — PROTEIN ELECTROPH W RFLX QUANT IMMUNOGLOBULINS
Albumin ELP: 37.4 % — ABNORMAL LOW (ref 55.8–66.1)
Alpha-1-Globulin: 5.2 % — ABNORMAL HIGH (ref 2.9–4.9)
Beta 2: 5.6 % (ref 3.2–6.5)
Beta Globulin: 5.4 % (ref 4.7–7.2)

## 2011-04-03 LAB — CARDIAC PANEL(CRET KIN+CKTOT+MB+TROPI)
CK, MB: 4.9 ng/mL — ABNORMAL HIGH (ref 0.3–4.0)
CK, MB: 7.2 ng/mL — ABNORMAL HIGH (ref 0.3–4.0)
Relative Index: 2.6 — ABNORMAL HIGH (ref 0.0–2.5)
Total CK: 113 U/L (ref 7–232)

## 2011-04-03 LAB — IMMUNOFIXATION ADD-ON

## 2011-04-03 LAB — URINE CULTURE

## 2011-04-03 LAB — TYPE AND SCREEN: ABO/RH(D): A POS

## 2011-04-03 LAB — POCT I-STAT, CHEM 8
BUN: 70 mg/dL — ABNORMAL HIGH (ref 6–23)
Creatinine, Ser: 3.9 mg/dL — ABNORMAL HIGH (ref 0.4–1.5)
Glucose, Bld: 202 mg/dL — ABNORMAL HIGH (ref 70–99)
Hemoglobin: 12.6 g/dL — ABNORMAL LOW (ref 13.0–17.0)
TCO2: 13 mmol/L (ref 0–100)

## 2011-04-03 LAB — TSH: TSH: 10.7 u[IU]/mL — ABNORMAL HIGH (ref 0.350–4.500)

## 2011-04-03 LAB — CK TOTAL AND CKMB (NOT AT ARMC): CK, MB: 4 ng/mL (ref 0.3–4.0)

## 2011-04-05 LAB — BRAIN NATRIURETIC PEPTIDE: Pro B Natriuretic peptide (BNP): 1630 pg/mL — ABNORMAL HIGH (ref 0.0–100.0)

## 2011-04-05 LAB — CBC
HCT: 27.9 % — ABNORMAL LOW (ref 39.0–52.0)
Hemoglobin: 9.6 g/dL — ABNORMAL LOW (ref 13.0–17.0)
MCV: 84.6 fL (ref 78.0–100.0)
Platelets: 129 10*3/uL — ABNORMAL LOW (ref 150–400)
RBC: 3.3 MIL/uL — ABNORMAL LOW (ref 4.22–5.81)
RDW: 16.8 % — ABNORMAL HIGH (ref 11.5–15.5)
RDW: 16.9 % — ABNORMAL HIGH (ref 11.5–15.5)
WBC: 7.7 10*3/uL (ref 4.0–10.5)

## 2011-04-05 LAB — COMPREHENSIVE METABOLIC PANEL
Albumin: 3.2 g/dL — ABNORMAL LOW (ref 3.5–5.2)
BUN: 38 mg/dL — ABNORMAL HIGH (ref 6–23)
Chloride: 106 mEq/L (ref 96–112)
Creatinine, Ser: 2 mg/dL — ABNORMAL HIGH (ref 0.4–1.5)
GFR calc non Af Amer: 33 mL/min — ABNORMAL LOW (ref >60)
Total Bilirubin: 1.2 mg/dL (ref 0.3–1.2)

## 2011-04-05 LAB — POCT CARDIAC MARKERS: Myoglobin, poc: 198 ng/mL (ref 12–200)

## 2011-04-05 LAB — D-DIMER, QUANTITATIVE: D-Dimer, Quant: 1.71 ug/mL-FEU — ABNORMAL HIGH (ref 0.00–0.48)

## 2011-04-05 LAB — TROPONIN I: Troponin I: 0.04 ng/mL (ref 0.00–0.06)

## 2011-04-05 LAB — BASIC METABOLIC PANEL
BUN: 51 mg/dL — ABNORMAL HIGH (ref 6–23)
Calcium: 9 mg/dL (ref 8.4–10.5)
Chloride: 99 mEq/L (ref 96–112)
Creatinine, Ser: 2.08 mg/dL — ABNORMAL HIGH (ref 0.4–1.5)
GFR calc Af Amer: 38 mL/min — ABNORMAL LOW (ref >60)
GFR calc non Af Amer: 32 mL/min — ABNORMAL LOW (ref >60)
Potassium: 3.7 mEq/L (ref 3.5–5.1)
Sodium: 135 mEq/L (ref 135–145)
Sodium: 136 mEq/L (ref 135–145)

## 2011-04-05 LAB — PROTIME-INR
INR: 1.1 (ref 0.00–1.49)
Prothrombin Time: 14.7 seconds (ref 11.6–15.2)

## 2011-04-05 LAB — GLUCOSE, CAPILLARY: Glucose-Capillary: 101 mg/dL — ABNORMAL HIGH (ref 70–99)

## 2011-04-05 LAB — DIFFERENTIAL
Basophils Absolute: 0.1 10*3/uL (ref 0.0–0.1)
Lymphocytes Relative: 35 % (ref 12–46)
Monocytes Relative: 9 % (ref 3–12)
Neutro Abs: 3.9 10*3/uL (ref 1.7–7.7)

## 2011-04-05 LAB — CK TOTAL AND CKMB (NOT AT ARMC)
CK, MB: 2.3 ng/mL (ref 0.3–4.0)
CK, MB: 2.4 ng/mL (ref 0.3–4.0)
CK, MB: 2.6 ng/mL (ref 0.3–4.0)
Relative Index: INVALID (ref 0.0–2.5)
Relative Index: INVALID (ref 0.0–2.5)
Total CK: 60 U/L (ref 7–232)
Total CK: 77 U/L (ref 7–232)

## 2011-04-05 LAB — URINALYSIS, ROUTINE W REFLEX MICROSCOPIC
Bilirubin Urine: NEGATIVE
Ketones, ur: NEGATIVE mg/dL
Nitrite: NEGATIVE
Urobilinogen, UA: 1 mg/dL (ref 0.0–1.0)

## 2011-04-06 LAB — GLUCOSE, CAPILLARY
Glucose-Capillary: 110 mg/dL — ABNORMAL HIGH (ref 70–99)
Glucose-Capillary: 110 mg/dL — ABNORMAL HIGH (ref 70–99)
Glucose-Capillary: 115 mg/dL — ABNORMAL HIGH (ref 70–99)
Glucose-Capillary: 120 mg/dL — ABNORMAL HIGH (ref 70–99)
Glucose-Capillary: 120 mg/dL — ABNORMAL HIGH (ref 70–99)
Glucose-Capillary: 122 mg/dL — ABNORMAL HIGH (ref 70–99)
Glucose-Capillary: 123 mg/dL — ABNORMAL HIGH (ref 70–99)
Glucose-Capillary: 124 mg/dL — ABNORMAL HIGH (ref 70–99)
Glucose-Capillary: 127 mg/dL — ABNORMAL HIGH (ref 70–99)
Glucose-Capillary: 130 mg/dL — ABNORMAL HIGH (ref 70–99)
Glucose-Capillary: 130 mg/dL — ABNORMAL HIGH (ref 70–99)
Glucose-Capillary: 131 mg/dL — ABNORMAL HIGH (ref 70–99)
Glucose-Capillary: 132 mg/dL — ABNORMAL HIGH (ref 70–99)
Glucose-Capillary: 135 mg/dL — ABNORMAL HIGH (ref 70–99)
Glucose-Capillary: 142 mg/dL — ABNORMAL HIGH (ref 70–99)
Glucose-Capillary: 150 mg/dL — ABNORMAL HIGH (ref 70–99)
Glucose-Capillary: 152 mg/dL — ABNORMAL HIGH (ref 70–99)
Glucose-Capillary: 155 mg/dL — ABNORMAL HIGH (ref 70–99)
Glucose-Capillary: 158 mg/dL — ABNORMAL HIGH (ref 70–99)
Glucose-Capillary: 162 mg/dL — ABNORMAL HIGH (ref 70–99)
Glucose-Capillary: 164 mg/dL — ABNORMAL HIGH (ref 70–99)
Glucose-Capillary: 165 mg/dL — ABNORMAL HIGH (ref 70–99)
Glucose-Capillary: 201 mg/dL — ABNORMAL HIGH (ref 70–99)
Glucose-Capillary: 85 mg/dL (ref 70–99)
Glucose-Capillary: 95 mg/dL (ref 70–99)
Glucose-Capillary: 99 mg/dL (ref 70–99)

## 2011-04-06 LAB — POCT I-STAT 3, ART BLOOD GAS (G3+)
Bicarbonate: 22.8 mEq/L (ref 20.0–24.0)
Bicarbonate: 23.7 mEq/L (ref 20.0–24.0)
O2 Saturation: 100 %
Patient temperature: 36.4
TCO2: 24 mmol/L (ref 0–100)
TCO2: 25 mmol/L (ref 0–100)
pCO2 arterial: 38.2 mmHg (ref 35.0–45.0)
pH, Arterial: 7.35 (ref 7.350–7.450)
pH, Arterial: 7.385 (ref 7.350–7.450)
pO2, Arterial: 443 mmHg — ABNORMAL HIGH (ref 80.0–100.0)

## 2011-04-06 LAB — BASIC METABOLIC PANEL
BUN: 25 mg/dL — ABNORMAL HIGH (ref 6–23)
BUN: 29 mg/dL — ABNORMAL HIGH (ref 6–23)
BUN: 30 mg/dL — ABNORMAL HIGH (ref 6–23)
BUN: 38 mg/dL — ABNORMAL HIGH (ref 6–23)
BUN: 45 mg/dL — ABNORMAL HIGH (ref 6–23)
CO2: 21 mEq/L (ref 19–32)
CO2: 25 mEq/L (ref 19–32)
CO2: 25 mEq/L (ref 19–32)
CO2: 25 mEq/L (ref 19–32)
CO2: 26 mEq/L (ref 19–32)
CO2: 26 mEq/L (ref 19–32)
CO2: 27 mEq/L (ref 19–32)
Calcium: 8.3 mg/dL — ABNORMAL LOW (ref 8.4–10.5)
Calcium: 8.7 mg/dL (ref 8.4–10.5)
Calcium: 8.9 mg/dL (ref 8.4–10.5)
Calcium: 9.2 mg/dL (ref 8.4–10.5)
Calcium: 9.2 mg/dL (ref 8.4–10.5)
Calcium: 9.5 mg/dL (ref 8.4–10.5)
Chloride: 100 mEq/L (ref 96–112)
Chloride: 102 mEq/L (ref 96–112)
Chloride: 104 mEq/L (ref 96–112)
Chloride: 109 mEq/L (ref 96–112)
Creatinine, Ser: 1.49 mg/dL (ref 0.4–1.5)
Creatinine, Ser: 1.66 mg/dL — ABNORMAL HIGH (ref 0.4–1.5)
Creatinine, Ser: 1.69 mg/dL — ABNORMAL HIGH (ref 0.4–1.5)
Creatinine, Ser: 1.77 mg/dL — ABNORMAL HIGH (ref 0.4–1.5)
Creatinine, Ser: 1.79 mg/dL — ABNORMAL HIGH (ref 0.4–1.5)
Creatinine, Ser: 2.02 mg/dL — ABNORMAL HIGH (ref 0.4–1.5)
Creatinine, Ser: 2.07 mg/dL — ABNORMAL HIGH (ref 0.4–1.5)
GFR calc Af Amer: 38 mL/min — ABNORMAL LOW (ref >60)
GFR calc Af Amer: 40 mL/min — ABNORMAL LOW (ref >60)
GFR calc Af Amer: 46 mL/min — ABNORMAL LOW (ref >60)
GFR calc Af Amer: 50 mL/min — ABNORMAL LOW (ref >60)
GFR calc Af Amer: >60 mL/min (ref >60)
GFR calc non Af Amer: 32 mL/min — ABNORMAL LOW (ref >60)
GFR calc non Af Amer: 33 mL/min — ABNORMAL LOW (ref >60)
GFR calc non Af Amer: 37 mL/min — ABNORMAL LOW (ref >60)
GFR calc non Af Amer: 38 mL/min — ABNORMAL LOW (ref >60)
GFR calc non Af Amer: 38 mL/min — ABNORMAL LOW (ref >60)
GFR calc non Af Amer: 46 mL/min — ABNORMAL LOW (ref >60)
Glucose, Bld: 100 mg/dL — ABNORMAL HIGH (ref 70–99)
Glucose, Bld: 112 mg/dL — ABNORMAL HIGH (ref 70–99)
Glucose, Bld: 116 mg/dL — ABNORMAL HIGH (ref 70–99)
Glucose, Bld: 128 mg/dL — ABNORMAL HIGH (ref 70–99)
Glucose, Bld: 168 mg/dL — ABNORMAL HIGH (ref 70–99)
Glucose, Bld: 175 mg/dL — ABNORMAL HIGH (ref 70–99)
Potassium: 3.7 mEq/L (ref 3.5–5.1)
Potassium: 3.7 mEq/L (ref 3.5–5.1)
Potassium: 4.2 mEq/L (ref 3.5–5.1)
Potassium: 4.3 mEq/L (ref 3.5–5.1)
Potassium: 4.3 mEq/L (ref 3.5–5.1)
Sodium: 134 mEq/L — ABNORMAL LOW (ref 135–145)
Sodium: 135 mEq/L (ref 135–145)
Sodium: 135 mEq/L (ref 135–145)
Sodium: 137 mEq/L (ref 135–145)

## 2011-04-06 LAB — POCT I-STAT, CHEM 8
Calcium, Ion: 1.19 mmol/L (ref 1.12–1.32)
Chloride: 109 mEq/L (ref 96–112)
HCT: 33 % — ABNORMAL LOW (ref 39.0–52.0)
Hemoglobin: 11.2 g/dL — ABNORMAL LOW (ref 13.0–17.0)
Potassium: 5.4 mEq/L — ABNORMAL HIGH (ref 3.5–5.1)

## 2011-04-06 LAB — CBC
HCT: 30.2 % — ABNORMAL LOW (ref 39.0–52.0)
HCT: 33.3 % — ABNORMAL LOW (ref 39.0–52.0)
HCT: 34.4 % — ABNORMAL LOW (ref 39.0–52.0)
HCT: 34.5 % — ABNORMAL LOW (ref 39.0–52.0)
HCT: 35.4 % — ABNORMAL LOW (ref 39.0–52.0)
HCT: 35.7 % — ABNORMAL LOW (ref 39.0–52.0)
Hemoglobin: 10.7 g/dL — ABNORMAL LOW (ref 13.0–17.0)
Hemoglobin: 11.6 g/dL — ABNORMAL LOW (ref 13.0–17.0)
Hemoglobin: 12 g/dL — ABNORMAL LOW (ref 13.0–17.0)
Hemoglobin: 12.6 g/dL — ABNORMAL LOW (ref 13.0–17.0)
Hemoglobin: 12.7 g/dL — ABNORMAL LOW (ref 13.0–17.0)
Hemoglobin: 12.9 g/dL — ABNORMAL LOW (ref 13.0–17.0)
Hemoglobin: 13.6 g/dL (ref 13.0–17.0)
Hemoglobin: 9.9 g/dL — ABNORMAL LOW (ref 13.0–17.0)
MCHC: 35 g/dL (ref 30.0–36.0)
MCHC: 35.1 g/dL (ref 30.0–36.0)
MCHC: 35.3 g/dL (ref 30.0–36.0)
MCHC: 35.4 g/dL (ref 30.0–36.0)
MCHC: 35.6 g/dL (ref 30.0–36.0)
MCHC: 35.8 g/dL (ref 30.0–36.0)
MCV: 88.4 fL (ref 78.0–100.0)
MCV: 89.9 fL (ref 78.0–100.0)
Platelets: 118 10*3/uL — ABNORMAL LOW (ref 150–400)
Platelets: 118 10*3/uL — ABNORMAL LOW (ref 150–400)
Platelets: 127 10*3/uL — ABNORMAL LOW (ref 150–400)
Platelets: 147 10*3/uL — ABNORMAL LOW (ref 150–400)
Platelets: 149 10*3/uL — ABNORMAL LOW (ref 150–400)
Platelets: 152 10*3/uL (ref 150–400)
Platelets: 172 10*3/uL (ref 150–400)
Platelets: 173 10*3/uL (ref 150–400)
RBC: 3.36 MIL/uL — ABNORMAL LOW (ref 4.22–5.81)
RBC: 3.88 MIL/uL — ABNORMAL LOW (ref 4.22–5.81)
RBC: 4.06 MIL/uL — ABNORMAL LOW (ref 4.22–5.81)
RBC: 4.13 MIL/uL — ABNORMAL LOW (ref 4.22–5.81)
RBC: 4.24 MIL/uL (ref 4.22–5.81)
RBC: 4.4 MIL/uL (ref 4.22–5.81)
RDW: 14.8 % (ref 11.5–15.5)
RDW: 15.1 % (ref 11.5–15.5)
RDW: 15.2 % (ref 11.5–15.5)
RDW: 15.8 % — ABNORMAL HIGH (ref 11.5–15.5)
RDW: 15.9 % — ABNORMAL HIGH (ref 11.5–15.5)
WBC: 6.5 10*3/uL (ref 4.0–10.5)
WBC: 6.5 10*3/uL (ref 4.0–10.5)
WBC: 7.2 10*3/uL (ref 4.0–10.5)
WBC: 7.5 10*3/uL (ref 4.0–10.5)
WBC: 8.5 10*3/uL (ref 4.0–10.5)
WBC: 9.3 10*3/uL (ref 4.0–10.5)

## 2011-04-06 LAB — POCT I-STAT 4, (NA,K, GLUC, HGB,HCT)
Glucose, Bld: 132 mg/dL — ABNORMAL HIGH (ref 70–99)
Glucose, Bld: 133 mg/dL — ABNORMAL HIGH (ref 70–99)
Glucose, Bld: 140 mg/dL — ABNORMAL HIGH (ref 70–99)
HCT: 23 % — ABNORMAL LOW (ref 39.0–52.0)
HCT: 27 % — ABNORMAL LOW (ref 39.0–52.0)
HCT: 37 % — ABNORMAL LOW (ref 39.0–52.0)
Hemoglobin: 7.8 g/dL — CL (ref 13.0–17.0)
Potassium: 4.3 mEq/L (ref 3.5–5.1)
Potassium: 4.7 mEq/L (ref 3.5–5.1)
Sodium: 138 mEq/L (ref 135–145)
Sodium: 138 mEq/L (ref 135–145)

## 2011-04-06 LAB — CREATININE, SERUM
Creatinine, Ser: 1.56 mg/dL — ABNORMAL HIGH (ref 0.4–1.5)
GFR calc non Af Amer: 44 mL/min — ABNORMAL LOW (ref >60)

## 2011-04-06 LAB — BLOOD GAS, ARTERIAL
Bicarbonate: 22.5 mEq/L (ref 20.0–24.0)
Patient temperature: 98.6
TCO2: 23.6 mmol/L (ref 0–100)
pCO2 arterial: 34.6 mmHg — ABNORMAL LOW (ref 35.0–45.0)
pH, Arterial: 7.43 (ref 7.350–7.450)
pO2, Arterial: 95.3 mmHg (ref 80.0–100.0)

## 2011-04-06 LAB — CROSSMATCH: Antibody Screen: NEGATIVE

## 2011-04-06 LAB — COMPREHENSIVE METABOLIC PANEL
ALT: 34 U/L (ref 0–53)
Alkaline Phosphatase: 73 U/L (ref 39–117)
BUN: 33 mg/dL — ABNORMAL HIGH (ref 6–23)
CO2: 25 mEq/L (ref 19–32)
Chloride: 104 mEq/L (ref 96–112)
Glucose, Bld: 119 mg/dL — ABNORMAL HIGH (ref 70–99)
Potassium: 4.1 mEq/L (ref 3.5–5.1)
Sodium: 136 mEq/L (ref 135–145)
Total Bilirubin: 0.7 mg/dL (ref 0.3–1.2)

## 2011-04-06 LAB — PROTIME-INR
INR: 1 (ref 0.00–1.49)
INR: 1.4 (ref 0.00–1.49)
Prothrombin Time: 13.5 seconds (ref 11.6–15.2)
Prothrombin Time: 17.3 seconds — ABNORMAL HIGH (ref 11.6–15.2)

## 2011-04-06 LAB — HEPARIN LEVEL (UNFRACTIONATED)
Heparin Unfractionated: 0.21 IU/mL — ABNORMAL LOW (ref 0.30–0.70)
Heparin Unfractionated: 0.24 IU/mL — ABNORMAL LOW (ref 0.30–0.70)
Heparin Unfractionated: 0.33 IU/mL (ref 0.30–0.70)
Heparin Unfractionated: 0.41 IU/mL (ref 0.30–0.70)
Heparin Unfractionated: 0.45 IU/mL (ref 0.30–0.70)
Heparin Unfractionated: 1.13 IU/mL — ABNORMAL HIGH (ref 0.30–0.70)

## 2011-04-06 LAB — APTT
aPTT: 36 seconds (ref 24–37)
aPTT: 67 seconds — ABNORMAL HIGH (ref 24–37)

## 2011-04-06 LAB — LIPID PANEL
Cholesterol: 149 mg/dL (ref 0–200)
HDL: 16 mg/dL — ABNORMAL LOW (ref >39)
LDL Cholesterol: 74 mg/dL (ref 0–99)
Total CHOL/HDL Ratio: 9.3 RATIO
Triglycerides: 296 mg/dL — ABNORMAL HIGH (ref <150)

## 2011-04-06 LAB — PREPARE PLATELETS

## 2011-04-06 LAB — MAGNESIUM: Magnesium: 3.1 mg/dL — ABNORMAL HIGH (ref 1.5–2.5)

## 2011-04-06 LAB — PLATELET COUNT: Platelets: 94 10*3/uL — ABNORMAL LOW (ref 150–400)

## 2011-04-06 LAB — HEMOGLOBIN AND HEMATOCRIT, BLOOD: HCT: 28.6 % — ABNORMAL LOW (ref 39.0–52.0)

## 2011-04-11 LAB — BASIC METABOLIC PANEL
BUN: 21 mg/dL (ref 6–23)
Creatinine, Ser: 1.45 mg/dL (ref 0.4–1.5)
GFR calc non Af Amer: 44 mL/min — ABNORMAL LOW (ref >60)
GFR calc non Af Amer: 48 mL/min — ABNORMAL LOW (ref >60)
Glucose, Bld: 103 mg/dL — ABNORMAL HIGH (ref 70–99)
Potassium: 3.5 mEq/L (ref 3.5–5.1)
Potassium: 3.6 mEq/L (ref 3.5–5.1)
Sodium: 135 mEq/L (ref 135–145)

## 2011-04-11 LAB — CBC
HCT: 34.2 % — ABNORMAL LOW (ref 39.0–52.0)
HCT: 35 % — ABNORMAL LOW (ref 39.0–52.0)
Hemoglobin: 12 g/dL — ABNORMAL LOW (ref 13.0–17.0)
Hemoglobin: 14.2 g/dL (ref 13.0–17.0)
MCHC: 34.4 g/dL (ref 30.0–36.0)
MCV: 89 fL (ref 78.0–100.0)
Platelets: 167 10*3/uL (ref 150–400)
RBC: 3.82 MIL/uL — ABNORMAL LOW (ref 4.22–5.81)
RBC: 4.05 MIL/uL — ABNORMAL LOW (ref 4.22–5.81)
RBC: 4.57 MIL/uL (ref 4.22–5.81)
RDW: 14.1 % (ref 11.5–15.5)
RDW: 14.4 % (ref 11.5–15.5)
WBC: 6.2 10*3/uL (ref 4.0–10.5)
WBC: 6.5 10*3/uL (ref 4.0–10.5)

## 2011-04-11 LAB — CARDIAC PANEL(CRET KIN+CKTOT+MB+TROPI)
CK, MB: 3 ng/mL (ref 0.3–4.0)
Total CK: 83 U/L (ref 7–232)
Troponin I: 0.15 ng/mL — ABNORMAL HIGH (ref 0.00–0.06)
Troponin I: 0.26 ng/mL — ABNORMAL HIGH (ref 0.00–0.06)

## 2011-04-11 LAB — POCT CARDIAC MARKERS
Myoglobin, poc: 148 ng/mL (ref 12–200)
Myoglobin, poc: 177 ng/mL (ref 12–200)
Troponin i, poc: <0.05 ng/mL (ref 0.00–0.09)

## 2011-04-11 LAB — GLUCOSE, CAPILLARY
Glucose-Capillary: 116 mg/dL — ABNORMAL HIGH (ref 70–99)
Glucose-Capillary: 132 mg/dL — ABNORMAL HIGH (ref 70–99)
Glucose-Capillary: 137 mg/dL — ABNORMAL HIGH (ref 70–99)
Glucose-Capillary: 138 mg/dL — ABNORMAL HIGH (ref 70–99)
Glucose-Capillary: 161 mg/dL — ABNORMAL HIGH (ref 70–99)
Glucose-Capillary: 365 mg/dL — ABNORMAL HIGH (ref 70–99)

## 2011-04-11 LAB — HEPARIN LEVEL (UNFRACTIONATED)
Heparin Unfractionated: 0.15 IU/mL — ABNORMAL LOW (ref 0.30–0.70)
Heparin Unfractionated: 0.25 IU/mL — ABNORMAL LOW (ref 0.30–0.70)

## 2011-04-11 LAB — DIFFERENTIAL
Lymphocytes Relative: 41 % (ref 12–46)
Lymphs Abs: 2.5 10*3/uL (ref 0.7–4.0)
Monocytes Absolute: 0.6 10*3/uL (ref 0.1–1.0)
Monocytes Relative: 9 % (ref 3–12)
Neutro Abs: 2.8 10*3/uL (ref 1.7–7.7)

## 2011-04-11 LAB — HEMOGLOBIN A1C
Hgb A1c MFr Bld: 9.2 % — ABNORMAL HIGH (ref 4.6–6.1)
Mean Plasma Glucose: 217 mg/dL

## 2011-04-11 LAB — PROTIME-INR: INR: 1 (ref 0.00–1.49)

## 2011-04-11 LAB — BRAIN NATRIURETIC PEPTIDE: Pro B Natriuretic peptide (BNP): 384 pg/mL — ABNORMAL HIGH (ref 0.0–100.0)

## 2011-04-11 LAB — CK TOTAL AND CKMB (NOT AT ARMC)
Relative Index: INVALID (ref 0.0–2.5)
Total CK: 87 U/L (ref 7–232)

## 2011-04-11 LAB — POCT I-STAT, CHEM 8
Calcium, Ion: 1.13 mmol/L (ref 1.12–1.32)
Hemoglobin: 14.6 g/dL (ref 13.0–17.0)
Sodium: 136 mEq/L (ref 135–145)
TCO2: 24 mmol/L (ref 0–100)

## 2011-04-11 LAB — HEPARIN ANTI-XA: Heparin LMW: 0.18 IU/mL

## 2011-04-11 LAB — APTT: aPTT: 27 seconds (ref 24–37)

## 2011-05-09 NOTE — Assessment & Plan Note (Signed)
OFFICE VISIT   [name] Proctor, [name] Proctor  DOB:  Mar 30, 1935                                        March 30, 2009  CHART #:  [id number]   The patient returned today for followup, status post redo coronary  artery bypass surgery x3 on March 01, 2009.  He had an uncomplicated  postoperative course.  Since discharge, he said that he has been feeling  well overall and is walking daily without chest pain or shortness of  breath.   PHYSICAL EXAMINATION:  VITAL SIGNS:  Blood pressure 125/74, pulse is 84  and regular, respiratory rate is 18, unlabored, and oxygen saturation on  room air is 98%.  GENERAL:  He looks well.  CARDIAC:  Regular rate and rhythm with normal heart sounds.  LUNGS:  Clear.  CHEST:  Incision is healing well and sternum is stable.  EXTREMITIES:  His leg incision is healing well and there is no lower  extremity edema.   Followup chest x-ray shows clear lung fields and no significant pleural  effusion.   His medications are aspirin 325 mg daily, Zetia 10 mg daily, Lasix 40 mg  b.i.d., Lopressor 100 mg daily, nifedipine 90 mg daily, and insulin.   IMPRESSION:  Overall, the patient is recovering well following his  surgery.  I told him he could return to driving a car.  I asked him to  not to lift anything heavier than 10 pounds for a total of 3  months from date of surgery.  He will continue to follow up with Dr.  Peter Swaziland and will contact me if he develops any problems with his  incisions.   Evelene Croon, M.D.  Electronically Signed   BB/MEDQ  D:  03/30/2009  T:  03/31/2009  Job:  [id number]   cc:   Peter M. Swaziland, M.D.

## 2011-05-09 NOTE — Discharge Summary (Signed)
NAME:  [name] Proctor, [name] Proctor NO.:  [id number]   MEDICAL RECORD NO.:  [id number]          PATIENT TYPE:  INP   LOCATION:  2019                         FACILITY:  [facility]   PHYSICIAN:  Evelene Croon, M.D.     DATE OF BIRTH:  Feb 03, 1935   DATE OF ADMISSION:  02/19/2009  DATE OF DISCHARGE:                               DISCHARGE SUMMARY   PRIMARY ADMITTING DIAGNOSIS:  Chest pain.   ADDITIONAL/DISCHARGE DIAGNOSES:  1. Severe three-vessel coronary artery disease (both native vessel and      saphenous vein graft disease).  2. History of coronary artery disease, status post prior coronary      artery bypass grafting in 1997.  3. History of paroxysmal atrial flutter, status post ablation and      pacemaker placement by Dr. Reyes Ivan.  4. History of chronic renal insufficiency, baseline creatinine 1.9.  5. Peripheral vascular disease.  6. Renal artery stenosis, status post left renal stent.  7. Hypertension.  8. Hyperlipidemia.  9. Type 2 diabetes mellitus.  10.Human immunodeficiency virus followed by Dr. Andrey Campanile at the [facility].   PROCEDURES PERFORMED:  1. Cardiac catheterization.  2. Redo coronary artery bypass grafting x3 (saphenous vein graft to      the obtuse marginal, sequential saphenous vein graft to the      posterior descending and posterolateral branch of the right      coronary artery).  3. Endoscopic vein harvest, left leg.   HISTORY:  The patient is a 76 year old male with a known history of  coronary artery disease.  He is status post previous CABG by Dr.  Tyrone Sage in 1997.  Since that time, he has had stenting of diagonal in  2005, and stenting of the vein graft to the PD in 2007.  Over the past  several months, he has had substernal chest discomfort with exercise,  which is relieved with nitroglycerin.  He had an episode on February 19, 2009, which radiated to his left shoulder and was unrelieved after 3  nitroglycerin tablets.  At that point, he  presented to the emergency  department for further evaluation and treatment.  He was noted to have  EKG changes in the ER and was subsequently admitted for further  evaluation and treatment.   HOSPITAL COURSE:  [name] Proctor was subsequently ruled in for a non-ST  segment elevation myocardial infarction.  He was started on heparin and  was seen by Dr. Swaziland and scheduled for cardiac catheterization.  This  was performed on February 22, 2009, and showed severe 3-vessel coronary  artery disease with occlusion of the native LAD and a right coronary  artery proximally.  The left circumflex gave off a large intermediate or  marginal vessel that was occluded proximally.  The left circumflex  portion and AV groove had no other significant marginal branches.  The  mammary artery graft to the LAD was widely patent with good runoff.  The  saphenous vein graft to the PD appear to be the culprit lesion with 95%  distal vein graft stenosis.  The previous areas of stenting had about a  20% narrowing.  The saphenous vein graft to the intermediate branch had  a 70% ostial stenosis and a 50% mid graft stenosis with diffuse disease.  The saphenous vein graft to the diagonal branch was occluded.  Ejection  fraction was 40-45% with mild marked regurgitation and global  hypokinesis.  Because of all the findings, TCTS was consulted for  consideration of redo CABG.  The patient was seen by Dr. Evelene Croon,  and his films were reviewed.  Dr. Laneta Simmers agreed that he would benefit  from redo CABG at this time.  He explained all risks, benefits, and  alternatives of the procedure to the patient and [name] Proctor agreed to  proceed.  He remained stable and pain free on heparin and nitroglycerin  in the hospital prior to surgery.  He was taken to the operating room on  March 01, 2009, and underwent redo CABG x3 as described in detail above.  For complete operative details, see previously dictated operative  report.  He tolerated  the procedure well and was transferred to the SICU  in stable condition.  He was able to be extubated shortly after surgery.  He was hemodynamically stable and doing well on postop day #1.  After  removal of all of his tubes and lines, he was able to be transferred to  the step-down unit.  Postoperative course has been uneventful.  He has  progressed well from a cardiac rehab standpoint.  He has remained  afebrile and all vital signs have been stable.  His blood pressure has  been trending upward and he has been restarted on nifedipine, which he  took prior to surgery and also his beta-blocker dosage has been  titrated.  His creatinine has remained stable postoperatively with a  peak of 1.79.  His incisions are all healing well.  He is tolerating a  regular diet and is having normal bowel and bladder function.  His most  recent labs show hemoglobin of 9.9, hematocrit 27.8, platelets 118, and  white count 7.9.  Sodium 138, potassium 4.3, BUN 30, and creatinine  1.79.  His blood sugars have been well-controlled on Lantus  (preoperative hemoglobin A1c of 9.2).  It is anticipated that if he  remained stable over the next 24 hours and no acute changes occur, he  will hopefully be ready for discharge home on March 05, 2009, pending  morning round evaluation.   DISCHARGE MEDICATIONS:  1. Aspirin 325 mg daily.  2. Zetia 10 mg daily.  3. Lopressor 75 mg b.i.d.  4. Nifedipine 90 mg daily.  5. Crestor 10 mg daily.  6. Oxycodone 5 mg 1-2 q.4 h. p.r.n. for pain.  7. Lantus 36 units at bedtime.   DISCHARGE INSTRUCTIONS:  He is asked to refrain from driving, heavy  lifting, or strenuous activity.  He may continue ambulating daily and  using his incentive spirometer.  He may shower daily and clean his  incisions with soap and water.  He may continue low-fat, low-sodium, and  carbohydrate-modified diet.   DISCHARGE FOLLOWUP:  He will need to make an appointment to see Dr.  Swaziland in 2 weeks.  He  will then follow up with Dr. Laneta Simmers in 3 weeks  for his chest x-ray.  He will follow up as directed with his primary  care physician at the [facility].  In the interim, if he experiences problems or  has questions, he is asked to contact our office  immediately.      Coral Ceo, P.A.      Evelene Croon, M.D.  Electronically Signed    GC/MEDQ  D:  03/04/2009  T:  03/05/2009  Job:  [id number]   cc:   Peter M. Swaziland, M.D.  [facility] Eliott Nine, M.D.  [facility]

## 2011-05-09 NOTE — H&P (Signed)
NAME:  [name] Proctor, [name] Proctor NO.:  [id number]   MEDICAL RECORD NO.:  [id number]          PATIENT TYPE:  EMS   LOCATION:  [facility]                         FACILITY:  [facility]   PHYSICIAN:  Jake Bathe, MD      DATE OF BIRTH:  1935-07-13   DATE OF ADMISSION:  02/19/2009  DATE OF DISCHARGE:                              HISTORY & PHYSICAL   CARDIOLOGIST:  Peter M. Swaziland, MD   PRIMARY CARE PHYSICIAN:  Dr. Andrey Campanile at [facility], Infectious Disease.   CHIEF COMPLAINT:  Chest pain.   HISTORY OF PRESENT ILLNESS:  A 76 year old male with coronary artery  disease, status post bypass in 1997 by Dr. Tyrone Sage (see below for  details) with peripheral vascular disease, status post left renal stent,  hypertension, chronic renal insufficiency, baseline creatinine of 1.9,  diabetes, HIV diagnosed in the 1990s, sick sinus syndrome, status post  pacemaker, status post atrial flutter ablation here with increasing  chest discomfort earlier today that was unrelieved with nitroglycerin.  Over the past several months, he states that with exercise or his daily  walking, he has to take at least one nitroglycerin to relieve chest  discomfort.  He has considered this stable.  Today, however, he had  increasing substernal chest discomfort with radiation to his left  shoulder, which was not relieved with three nitroglycerin.  He still had  ongoing left shoulder discomfort.  This concerned him and prompted his  emergency room evaluation.  Once here in the emergency department, he  was given nitroglycerin once again and patch was placed and he  subsequently had relief of his chest discomfort.  He told the ED  physician that he wanted to go home and he was amenable to at least  seeing cardiologist.  In talking with him, his chest pain has certainly  increased in frequency and severity over the past several days.  He has  discontinued one of his HIV medications called Raltegravir 400 mg, which  his  physician said could cause chest discomfort.  He says that it  usually takes about 2 weeks for this to get out of his system.  He  believes that this is the reason for his worsening chest discomfort.  He  stopped this medication on Tuesday.  He denies any significant shortness  of breath or diaphoresis or nausea.   PAST MEDICAL HISTORY:  1. Coronary artery disease, status post bypass in 1997, Dr. Tyrone Sage,      LIMA to LAD, SVG to diagonal, SVG to ramus, SVG to PDA.  Subsequent      stent placement both Taxus drug-eluting stent to his diagonal graft      as well as PDA by Dr. Swaziland.  2. Paroxysmal atrial flutter, status post flutter ablation with      pacemaker placement by Dr. Reyes Ivan.  3. Diabetes.  4. Chronic renal insufficiency - followed by Dr. Eliott Nine, last      creatinine 1.9.  5. Peripheral vascular disease - renal artery stenosis, status post      left renal stent.  6. Hypertension.  7. Hyperlipidemia.  8. HIV diagnosed in the 1990s, was on multidrug regimen, currently off      all medications due to side effects.  He is followed at the [facility] by Dr. Andrey Campanile.   ALLERGIES:  SELDANE and DARVOCET.   MEDICATIONS AT HOME:  1. He is taking Plavix 75 mg once a day.  2. Zetia 10 mg once a day.  3. Metoprolol 100 mg once a day, long acting.  4. Nifedipine 90 mg once a day.  5. Nitroglycerin p.r.n.  6. Aspirin 81 mg a day.  7. Lasix 40 mg twice a day.  8. Novolin insulin he believes 40 mg at bedtime, he was on several      different HIV medicines, which is recently stopped.   SOCIAL HISTORY:  Retired Arts administrator.  Denies any tobacco, alcohol,  or illicit drug use.  He is divorced.   FAMILY HISTORY:  Father died at age 34 with hypertension and possible  coronary artery disease.   REVIEW OF SYSTEMS:  He denies any bleeding, syncope, palpitations,  rashes, or shortness of breath.  Unless stated above all other 12 review  of systems negative.   PHYSICAL  EXAMINATION:  VITAL SIGNS:  Currently afebrile, pulse 85  ventricular paced on telemetry, blood pressure 130/70, respirations 16,  sating 99% on 2 liters.  GENERAL:  Alert and oriented x3 in no acute distress.  EYES:  Well-perfused conjunctivae.  EOMI.  No scleral icterus.  NECK:  No lymphadenopathy.  Bilateral carotid bruits appreciated.  No  JVD.  CARDIOVASCULAR:  Regular rate and rhythm with split S2 consistent with  pacemaker.  Normal PMI.  Prior CABG scar noted.  LUNGS:  Clear to auscultation bilaterally.  Normal respiratory effort.  No wheezes.  No rales.  ABDOMEN:  Soft and nontender.  Normoactive bowel sounds.  Mildly obese.  No bruits.  EXTREMITIES:  No clubbing, cyanosis, or edema.  Normal distal pulses.  SKIN:  No rashes.  Dry.  NEUROLOGIC:  Nonfocal.  No no tremors noted.  PSYCHIATRIC:  Normal affect.   LABORATORY DATA:  EKG personally viewed shows sinus rhythm with  nonspecific ST-T wave changes.  QTC was calculated at 466 milliseconds,  however, I do believe it is more in the 380 range.  First-degree AV  block is also noted.  On telemetry while talking to him, he was pacing.  Prior ECG from December 08, 2006, showed deeply inverted T-waves or  inverted T-waves in V2, V3, V5, V6 which are currently not present.  No  Q-waves present.  Chest x-ray personally viewed shows cardiomegaly,  status post bypass changes.  No acute airspace disease.  He does have  some minor pulmonary venous congestion, however, with no edema,  pacemaker present.  BNP 384.  PT/PTT were normal.  First set of cardiac  biomarker at 1649 was normal.  Sodium 136, potassium 4.1, BUN 26,  creatinine 1.8, glucose elevated at 388.  Hemoglobin A1c pending.  Fasting lipid profile pending.  White blood cell count 6.2, hemoglobin  14.2, hematocrit 40.2, and platelets 173. Prior medical records  reviewed.   ASSESSMENT AND PLAN:  A 76 year old male with coronary artery disease,  status post bypass with  diabetes, hypertension, hyperlipidemia, human  immunodeficiency virus with chest pain concerning for unstable angina.  1. Unstable angina - I have convinced him to stay in the hospital for      admission.  I will go ahead and initiate heparin IV given his renal  insufficiency.  Continue beta-blocker metoprolol.  I will continue      nifedipine.  I will also add Imdur 30 mg once a day to his drug      regimen.  Continue with aspirin and Plavix.  Given that he is      currently chest pain-free, I will not give him IV nitroglycerin.      Continue to cycle cardiac enzymes x3 q.8 h. and watch his EKG in      the morning.  If cardiac enzymes are normal and EKG is normal or      unchanged and his symptoms have resolved on current medical      therapy, I will consider discharge secondary to low-risk acute      coronary syndrome with possibly planned outpatient stress test.  We      would discuss further with Dr. Swaziland.  He does understand that      there may be a possibility that he will have to stay until Monday      for cardiac catheterization if enzymes are positive.  2. Chronic renal insufficiency - if he does need a cardiac      catheterization, creatinine is 1.8, we will hydrate and give      bicarbonate as well as Mucomyst.  3. Human immunodeficiency virus - he is currently off all of his      medications.  He is convinced that it is one of his medications      that is causing his chest pain.  He with human immunodeficiency      virus certainly can be accelerated atherosclerosis and worsening of      coronary artery disease.  Regardless I think it will be beneficial      for him to obtain a nuclear stress test at some point to evaluate      worsening ischemia.  4. Peripheral vascular disease - renal artery stent, left-sided.  5. Hyperlipidemia - we will continue Zetia.  He is not currently on a      statin medication.  6. Diabetes - increased glucose of 388 on admission.  I will  check a      hemoglobin A1c.  We will place him on insulin sliding scale.  I      will also give him Lantus 20 units at night.  He states that he      takes 40 units of insulin usually at night.  I am unsure of type of      insulin, however.      Jake Bathe, MD  Electronically Signed     MCS/MEDQ  D:  02/19/2009  T:  02/20/2009  Job:  [id number]   cc:   Peter M. Swaziland, M.D.

## 2011-05-09 NOTE — Op Note (Signed)
NAME:  [name] Proctor, [name] Proctor NO.:  [id number]   MEDICAL RECORD NO.:  [id number]          PATIENT TYPE:  INP   LOCATION:  2304                         FACILITY:  [facility]   PHYSICIAN:  Evelene Croon, M.D.     DATE OF BIRTH:  [birth year]   DATE OF PROCEDURE:  03/01/2009  DATE OF DISCHARGE:                               OPERATIVE REPORT   PREOPERATIVE DIAGNOSIS:  Severe native three-vessel coronary artery  disease and saphenous vein graft disease, status post coronary artery  bypass graft surgery in 1997.   POSTOPERATIVE DIAGNOSIS:  Severe native three-vessel coronary artery  disease and saphenous vein graft disease, status post coronary artery  bypass graft surgery in 1997.   OPERATIVE PROCEDURE:  Redo median sternotomy, extracorporeal  circulation, redo coronary artery bypass graft surgery x3 using a  saphenous vein graft to the obtuse marginal branch of the left  circumflex artery coronary, and a sequential saphenous vein graft to the  posterior descending and posterolateral branches of the right coronary  artery.  Endoscopic vein harvesting from the left leg.   ATTENDING SURGEON:  Evelene Croon, MD.   ASSISTANT:  Doree Fudge, PA-C   ANESTHESIA:  General endotracheal.   CLINICAL HISTORY:  This patient is 76 year old gentleman who underwent  coronary artery bypass graft surgery by Dr. Tyrone Sage in 1997.  At that  surgery, he had a left internal mammary graft of the LAD, saphenous vein  graft to the diagonal branch, saphenous vein graft to obtuse marginal  branch, and a saphenous vein graft to posterior descending coronary  artery.  He subsequently underwent stenting of the diagonal branch in  2005 and in 2007 and stenting of the vein graft to the posterior  descending in 2007.  For the past several months, he has had substernal  chest pain with daily exercise, relieved with one nitroglycerin.  He  noted increasing chest discomfort on February 19, 2009 with  radiation to  his left shoulder that was not relieved with three nitroglycerin and was  then presented to the emergency room.  He ruled in for a non-ST-segment  elevation MI.  Cardiac catheterization showed severe native three-vessel  disease with occlusion of his LAD and right coronary arteries  proximally.  The left circumflex gave off a large intermediate or  marginal vessel that was occluded proximally.  The left circumflex  portion in the AV groove had no other significant marginal branches.  The left internal mammary graft of the LAD was large widely patent  vessel with good runoff.  The saphenous vein graft to the posterior  descending appeared to be the culprit with 95% distal vein graft  stenosis.  The previous areas of stenting had about 20% narrowing.  Saphenous vein graft of the intermediate branch had 70% ostial stenosis  and 50% mid graft stenosis with diffuse disease.  The saphenous vein  graft to the diagonal branch was occluded.  Ejection fraction was 40-45%  with mild mitral regurgitation.  There is global hypokinesis.  After  review of the catheterization and examination the patient, it was felt  that coronary artery  bypass graft surgery was the best treatment.  I  discussed the operative procedure of redo coronary artery bypass surgery  with the patient including alternatives, benefits, and risks including  but not limited to bleeding, blood transfusion, infection, stroke,  myocardial infarction, graft failure, and death.  I also discussed the  possibility of complete renal failure requiring dialysis since his  preoperative creatinine is 2.0.  He understood all of this and agreed to  proceed.   OPERATIVE PROCEDURE:  The patient was taken to the operating room and  placed on table in supine position.  After induction of general  endotracheal anesthesia, a Foley catheter was placed in bladder using  sterile technique.  Preoperative intravenous antibiotics were given.   Then, the chest, abdomen, and both lower extremities were prepped and  draped in usual sterile manner.  The left greater saphenous vein was  harvested using endoscopic vein harvest technique.  This vein was of  medium size and good quality.  Just below the knee, bifurcated into two  small veins that were unsuitable.  Luckily, we had not been above the  knee that was suitable.   Then, the chest was opened through the previous median sternotomy  incision.  The sternal wires were removed.  The sternum was opened using  the oscillating saw without difficulty.  Bone hooks were placed and the  sternal edges were retracted.  The mediastinal structures were separated  from the sternum using electrocautery.  The chest retractor was then  placed.  Dissection was performed to expose the right atrium and  ascending aorta.  The patient was then heparinized and when an adequate  activated clotting time was achieved, the distal ascending aorta was  cannulated using a 20-French aortic cannula for arterial inflow.  Venous  outflow was achieved using a large single stage venous cannula placed  through a pursestring suture on the right atrium.  An antegrade  cardioplegia and vent cannula was inserted in the aortic root.  A  retrograde cardioplegic cannula was inserted through the right atrium  and the coronary sinus.   The patient was then placed on cardiopulmonary bypass.  As much of the  heart as possible was dissected free from the pericardium.  There were  fairly dense adhesions present throughout the pericardium.  Then, the  left internal mammary pedicle was identified as it entered into the  pericardium.  This was carefully preserved and encircled.  Then, a clamp  was placed across the mammary pedicle.  This was an atraumatic vascular  bulldog clamp.  Then, 500 mL of cold blood antegrade cardioplegia was  administered in the aortic root with quick arrest of the heart.  This  followed by 500 mL of  cold blood retrograde cardioplegia.  Systemic  hypothermia to 28-degree centigrade and topical hypothermic iced saline  was used.  Temperature probe was placed in septum and insulating pad in  the pericardium.   The remainder of the heart was dissected free from the pericardium.  Then, the distal coronary arteries were identified.  The intermediate or  obtuse marginal branch was identified.  The previous vein graft was  anastomosed to the vessel just before it became intramyocardial.  This  area was somewhat difficult to expose since it was fairly high on the  anterolateral wall.  The muscle was carefully incised and this vessel  was traced down in the myocardium where was a medium-sized graftable  vessel.  Then, the first distal anastomosis was performed to this  vessel.  The internal diameter was 1.75 mm.  The conduit was used was  the segment of greater saphenous vein and anastomosis performed in an  end-to-side manner using continuous 7-0 Prolene suture.  Flow was noted  through the graft and was excellent.   The second distal anastomosis was then performed to the posterior  descending coronary artery.  The internal diameter of this vessel was  1.75 mm.  The conduit used was a second segment of greater saphenous  vein and the anastomosis performed in a sequential side-to-side manner  using continuous 7-0 Prolene suture.  Flow was measured through the  graft and was excellent.   Then, the third distal anastomosis was performed with a posterolateral  branch.  The internal diameter of this vessel was about 1.75 mm.  The  conduit used was same segment of greater saphenous vein and the  anastomosis performed in a sequential end-to-side manner using  continuous 7-0 Prolene suture.  Flow was measured through the graft was  excellent.  Then, another dose of cardioplegia was given down vein  grafts and in a retrograde manner.  The patient was then rewarmed to 37  degrees centigrade.  With  the crossclamp in place, the two proximal vein  graft and anastomoses were performed.  The proximal anastomosis of the  obtuse marginal vein graft was performed to the hood of the old diagonal  vein graft, which had been occluded.  The hood of this graft was still  soft.  The proximal anastomosis of the right coronary artery vein graft  was performed directly through the aorta.  Anastomoses were performed in  an end-to-side manner using continuous 6-0 Prolene suture.  Then, the  clamp was removed from the mammary pedicle.  The crossclamp was removed  with the time of 97 minutes.  There was spontaneous return of sinus  rhythm.  The proximal and distal anastomoses appeared hemostatic and  allowed the grafts satisfactory.  Graft markers were placed around the  proximal anastomoses.  Two temporary right ventricular and right atrial  pacing wires were placed and brought out through the skin.   When the patient rewarmed to 37 degrees centigrade, he was weaned from  cardiopulmonary bypass on renal dose dopamine.  Total bypass time was  134 minutes.  Cardiac function appeared excellent.  The cardia output of  4-5 liters per minute.  Protamine was given and the venous and aortic  cast removed without difficulty.  Hemostasis was achieved.  The patient  was given 1 unit of platelet due to platelet count of 90,000.  Then, two  chest tubes were placed with tube in the posterior pericardium and one  in the anterior mediastinum.  The sternum was closed with double #6  stainless steel wires.  The fascia was closed with continuous #1 Vicryl  suture.  Subcutaneous tissue was closed with continuous 2-0 Vicryl and  the skin with 3-0 Vicryl subcuticular closure.  The lower extremity vein  harvest site was closed in layers in a similar manner.  The sponge,  needle, and instrument counts were correct per the scrub nurse.  Dry  sterile dressing was applied over the incisions and the chest tubes with  Pleur-Evac  suction.  The patient remained hemodynamically stable and  transferred to the SICU in guarded, but stable condition.      Evelene Croon, M.D.  Electronically Signed     BB/MEDQ  D:  03/01/2009  T:  03/02/2009  Job:  [id number]   cc:  Peter M. [place], M.D.

## 2011-05-09 NOTE — Cardiovascular Report (Signed)
NAME:  [name] Proctor, [name] Proctor NO.:  [id number]   MEDICAL RECORD NO.:  [id number]          PATIENT TYPE:  INP   LOCATION:  4705                         FACILITY:  [facility]   PHYSICIAN:  Peter M. [place]iland, M.D.  DATE OF BIRTH:  15-Jun-1935   DATE OF PROCEDURE:  02/22/2009  DATE OF DISCHARGE:                            CARDIAC CATHETERIZATION   INDICATIONS FOR PROCEDURE:  A 76 year old white male status post  coronary artery bypass surgery in 1997.  He subsequently had stenting of  the vein graft to the first diagonal branch in 2005 and 2007.  He has  stenting of the vein graft to the PDA in 2007.  He presents now with a  small non-Q-wave myocardial infarction.  The patient also has a history  of sick sinus syndrome and has had a previous pacemaker implant.  He has  history of atrial flutter and is status post atrial flutter ablation.  He has diabetes, peripheral vascular disease, hypertension, and  hyperlipidemia.   PROCEDURES:  1. Left heart catheterization.  2. Coronary left ventricular angiography.  3. Saphenous vein graft angiography x3.  4. LIMA graft angiography.   EQUIPMENTS USED:  1. A 6-French 4-cm right and left Judkins catheter.  2. A 6-French pigtail catheter.  3. A 6-French LCB catheter.  4. A 6-French arterial sheath access via right femoral artery using      the standard Seldinger technique.   CONTRAST:  150 mL of Omnipaque.   MEDICATIONS:  Local anesthesia 1% Xylocaine, Versed 2 mg IV.   HEMODYNAMIC DATA:  The aortic pressure was 137/70 with a mean of 96  mmHg.  Left ventricular pressure was 144 with EDP of 23 mmHg.   ANGIOGRAPHIC DATA:  The left coronary artery arises and distributes  normally.  The left main coronary has only minimal irregularities less  than 10%.   The left anterior descending artery was occluded proximally.   There was a large intermediate vessel, which was occluded proximally.   The left circumflex coronary artery extends in  the AV groove.  It has a  90% stenosis distally.  There were no other significant marginal vessels  after the ramus branch.   The right coronary artery arises normally.  It was occluded proximally.  There were right-to-right collaterals to the mid right coronary artery  and left-to-right and right-to-right collaterals to the distal right  coronary and posterolateral branches.   The saphenous vein graft to the PDA was patent.  The stent in the mid  body of the vein graft was still patent with approximately 20% stenosis.  In the distal vein graft, there was a critical 95% stenosis.  There was  good runoff with good size of the PDA.   The saphenous vein graft to the intermediate branch was patent.  It has  a 70% ostial stenosis in the vein graft.  The mid graft was diffusely  diseased up to 50%.   Saphenous vein graft to the diagonal branch was occluded.   LIMA graft to the LAD was a large graft and widely patent with good  runoff.  Left ventricular angiography was performed in RAO view.  This  demonstrates global hypokinesia with overall moderate left ventricular  dysfunction and ejection fraction of 40-45%.  There was mild mitral  insufficiency.   FINAL INTERPRETATION:  1. Severe 3-vessel obstructive coronary artery disease.  2. Progressive vein graft disease with occlusion of the vein graft to      the diagonal branch.  There was a new high-grade stenosis in the      vein graft to the posterior descending artery.  There was moderate      diffuse disease in the vein graft to the ramus intermediate branch.  3. Patent left internal mammary artery graft to the left anterior      descending.  4. Moderate left ventricular dysfunction.   PLAN:  We would recommend evaluation by Cardiothoracic Surgery for  consideration of redo coronary artery bypass surgery.           ______________________________  Peter M. [place], M.D.     PMJ/MEDQ  D:  02/22/2009  T:  02/23/2009  Job:   [id number]   cc:   Haywood Pao, MD  Sheliah Plane, MD

## 2011-05-09 NOTE — Discharge Summary (Signed)
NAME:  [name] Proctor, [name] Proctor NO.:  [id number]   MEDICAL RECORD NO.:  [id number]          PATIENT TYPE:  INP   LOCATION:  4713                         FACILITY:  [facility]   PHYSICIAN:  Peter M. [place]iland, M.D.  DATE OF BIRTH:  1935/05/27   DATE OF ADMISSION:  04/04/2009  DATE OF DISCHARGE:  04/06/2009                               DISCHARGE SUMMARY   HISTORY OF PRESENT ILLNESS:  Mr. Antonelli is a 76 year old white male with  known history of coronary artery disease.  He recently underwent redo  coronary artery bypass surgery on March 01, 2009.  This included  saphenous vein graft to the obtuse marginal, saphenous vein graft to the  posterior descending artery and posterolateral branch of the right  coronary artery.  He had an old LIMA graft to the LAD that was still  patent.  Following his bypass surgery, the patient did very well.  He  had actually been on Lasix 40 mg twice a day prior to his bypass surgery  but subsequently did not require diuretic therapy postoperatively.  He  did well until the morning of admission when he awoke at 2:00 a.m. and  had significant shortness of breath to the point where he had to sit up.  He felt like his heart was racing.  He presented to the emergency room  and was found to be in congestive heart failure.  He was admitted for  further evaluation and management.  The patient does have a remote  history of atrial flutter and has had previous atrial flutter ablation  and has had previous pacemaker implant.  He has long-standing history of  diabetes mellitus, renal insufficiency, hypertension, and  hyperlipidemia.  His baseline creatinine postoperatively was  approximately 1.8.  For details of his past medical history, social  history, family history, please see admission history and physical.   LABORATORY DATA:  ECG showed normal sinus rhythm with left anterior  fascicular block.  He had no acute ST or T wave changes.  Chest x-ray  showed  postoperative changes with evidence of new interstitial pulmonary  edema.  Hemoglobin was 9.4, hematocrit 27.9, white count 7700, platelets  129,000.  Sodium was 136, potassium 3.9, chloride 106, CO2 23, BUN was  38, creatinine 2.09, and glucose was 140.  Coags were normal.  Liver  function studies were normal.  Albumin was 3.2.  Serial cardiac enzymes  were negative x3.  Urinalysis was negative.  Calcium was 9.0.  BNP level  was elevated at 2514.  D-dimer was 1.71.  TSH was elevated at 10.904.   HOSPITAL COURSE:  The patient was admitted to telemetry monitor.  He was  treated with IV diuresis.  He had an excellent diuretic response.  Within 2 days of his hospital stay, he lost approximately 5 pounds in  weight.  His symptoms of dyspnea resolved.  His exam showed resolution  of his rales.  He had no gallop.  His edema resolved.  Given his  persistent anemia postoperatively, he was still on iron replacement  therapy.  We also started him on low-dose Synthroid  for his  hypothyroidism.  He was transitioned to oral Lasix.  With diuresis, his  BUN did increase to 51 with a creatinine of 2.47.  His potassium was  repleted.  His BNP level declined to 1630.  He had a repeat  echocardiogram performed.  This demonstrated normal left ventricular  size with septal hypokinesia and mild paradoxical septal motion with  ejection fraction estimated 50%.  He had mild-to-moderate aortic  insufficiency.  There was moderate mitral insufficiency.  He had mild-to-  moderate left atrial enlargement.  Right ventricular systolic pressure  was elevated with an estimated PA pressure of 49 mmHg.  Compared to his  prior study in March, his LV function had not changed significantly.  There was slight increase in his mitral insufficiency, and his pulmonary  pressures were elevated consistent with his decompensated state.  Given  his prompt response to diuretics, it was felt that the patient was  stable for discharge on  April 06, 2009.  He was discharged on Lasix 40  mg once a day with potassium supplementation.   DISCHARGE DIAGNOSES:  1. Congestive heart failure secondary to acute diastolic dysfunction      and mitral insufficiency.  2. Chronic renal insufficiency.  3. Coronary artery disease status post redo coronary artery bypass      surgery.  4. Diabetes mellitus, insulin requiring.  5. History of atrial flutter status post atrial flutter ablation.  6. History of renal artery stenosis status post left renal artery      stent.  7. Hypertension.  8. Dyslipidemia.  9. Hypothyroidism.  10.Iron deficiency anemia.   DISCHARGE MEDICATIONS:  1. Aspirin 81 mg per day.  2. Zetia 10 mg daily.  3. Nifedipine 90 mg per day.  4. Toprol-XL 100 mg daily.  5. Lasix 40 mg daily.  6. Potassium 20 mEq daily.  7. Iron sulfate 325 mg per day.  8. Synthroid 0.025 mg daily.  9. Crestor 10 mg per day.   The patient will follow up with Dr. Sw[place]and in 1-2 weeks.  We will repeat  a BMET, BNP level, and CBC at that time.  Recommend repeat TSH in  approximately 2 months.  Discharge status is improved.           ______________________________  Peter M. [place], M.D.     PMJ/MEDQ  D:  04/06/2009  T:  04/06/2009  Job:  [id number]   cc:   Evelene Croon, M.D.  Haywood Pao, MD

## 2011-05-09 NOTE — Consult Note (Signed)
NAME:  [name] Proctor, [name] Proctor NO.:  [id number]   MEDICAL RECORD NO.:  [id number]          PATIENT TYPE:  INP   LOCATION:  4705                         FACILITY:  [facility]   PHYSICIAN:  Evelene Croon, M.D.     DATE OF BIRTH:  1935/05/16   DATE OF CONSULTATION:  02/23/2009  DATE OF DISCHARGE:                                 CONSULTATION   REFERRING PHYSICIAN:  Peter M. Swaziland, MD   REASON FOR CONSULTATION:  Severe native 3-vessel coronary artery disease  and saphenous vein graft disease, status post coronary artery bypass  graft surgery in 1997.   CLINICAL HISTORY:  I was asked by Dr. Swaziland to evaluate Mr. Goyne for  consideration of redo coronary artery bypass graft surgery.  He is a 76-  year-old gentleman with multiple cardiac risk factors who underwent  coronary artery bypass graft surgery by Dr. Tyrone Sage in 1997.  He has  subsequently undergone stenting of the first diagonal branch in 2005 and  in 2007 and stenting of the vein graft posterior descending artery in  2007.  He has noted over the past several months that he has had  substernal chest pain with his daily exercise, relieved with 1  nitroglycerin.  On February 19, 2009, he noted increasing chest  discomfort with radiation to his left shoulder that was not relieved  with 3 nitroglycerins.  He presents to the emergency room where he was  given further nitroglycerin, which relieved his chest pain.  He did rule  in for non-ST-segment elevation MI with a peak troponin of 0.26 and a  peak CPK of 87 and MB of 3.1.  He underwent cardiac catheterization  yesterday by Dr. Swaziland, which showed occlusion of his LAD and right  coronary artery proximally.  There was a large intermediate vessel that  was occluded proximally.  The left circumflex stented in the AV groove,  but there were no other significant marginal branches after the  intermediate branch.  The left internal mammary graft to the LAD was  large, widely patent  vessel with good run-off.  The saphenous vein graft  to the posterior descending appeared to be a culprit with a 95% distal  vein graft stenosis.  The previously placed stent was patent with about  20% narrowing.  The saphenous vein graft to the intermediate branch had  70% ostial stenosis, another 50% vein graft stenosis with diffuse  disease.  The saphenous vein graft to the diagonal was occluded.  Ejection fraction was 40-45% with mild mitral regurgitation.  There was  global hypokinesis.  He had mild chest discomfort after catheterization,  which was relieved with intravenous nitroglycerin.  He has had no  further chest pain since.   REVIEW OF SYSTEMS:  GENERAL:  He denies any fever or chills.  He has had  no recent weight changes.  He has had fatigue.  EYES:  Negative.  ENT:  Negative.  ENDOCRINE:  He has intolerance of diabetes.  He has no  hypothyroidism.  CARDIOVASCULAR:  As above.  He denies PND or orthopnea,  but he has had  some exertional dyspnea.  He denies palpitations.  RESPIRATORY:  He denies cough and sputum production.  GI:  He has had no  nausea or vomiting.  He denies melena and bright red blood per rectum.  GU:  He denies dysuria and hematuria.  MUSCULOSKELETAL:  He denies  arthralgias and myalgias.  NEUROLOGICAL:  He denies any focal weakness  or numbness.  He denies dizziness or syncope.  He has never had TIA or  stroke.  PSYCHIATRIC:  Negative.  HEMATOLOGICAL:  Negative.   ALLERGIES:  SELDANE and DARVOCET.   PAST MEDICAL HISTORY:  Significant for coronary artery disease status  post coronary artery bypass in 1997 by Dr. Tyrone Sage.  He has a history  of paroxysmal atrial flutter and status post atrial flutter ablation  with subsequent pacemaker placement by Dr. Reyes Ivan.  He has a history of  diabetes.  He has a history of chronic renal insufficiency with  creatinine around 1.9, followed by Dr. Eliott Nine.  He has a history of  peripheral vascular disease with renal  artery stenosis status post left  renal stent.  He has a history of hypertension and hyperlipidemia.  He  has a history of HIV diagnosed in 1990s, followed by Dr. Andrey Campanile at [facility].  He has previously been treated with multidrug regimen, but he  has recently off all medications due to side effects.   MEDICATIONS:  His previous medications were:  1. Plavix 75 mg daily.  2. Zetia 10 mg daily.  3. Toprol-XL 100 mg daily.  4. Nifedipine 90 mg daily.  5. Aspirin 81 mg daily.  6. Lasix 40 mg b.i.d.  7. Novolin Insulin 40 units at bedtime.  8. He uses sublingual nitroglycerin p.r.n.   SOCIAL HISTORY:  He is a retired Sales executive.  He denies smoking  and alcohol use.  He is divorced.   FAMILY HISTORY:  His father died at age 54 with hypertension and  possible coronary artery disease.   PHYSICAL EXAMINATION:  GENERAL:  He is a well-developed white male in no  distress.  VITAL SIGNS:  Blood pressure is 131/70, his pulse is 70 and regular, and  respiratory rate is 18 and nonlabored.  HEENT:  Normocephalic and atraumatic.  Pupils are equal and reactive to  light and accommodation.  Extraocular muscles are intact.  His throat is  clear.  NECK:  Normal carotid pulses bilaterally.  There are bilateral cervical  bruits.  There is no adenopathy or thyromegaly.  CARDIAC:  Regular rate and rhythm with normal S1 and S2.  There is no  murmur, rub, or gallop.  There is a well-healed sternotomy incision.  Sternum is stable.  LUNGS:  Clear.  ABDOMEN:  Active bowel sounds.  His abdomen is soft, mildly obese, and  nontender.  There are no palpable masses or organomegaly.  EXTREMITIES:  No peripheral edema.  Saphenous vein was harvested from  the right ankle to the right thigh.  There were few varicose veins in  the left leg.  SKIN:  Warm and dry.  NEUROLOGIC:  Alert and oriented x3.  Motor exam and sensory exam is  grossly normal.   Upper extremity arterial Doppler exam shows pulmonary  artery segment to  be normal with ulnar compression decreased to 50% with radial  compression bilaterally.  Carotid Doppler examination shows 40-59%  stenosis in the left internal carotid artery.  There is no right  internal carotid artery stenosis.  Vertebral flow was antegrade  bilaterally.  IMPRESSION:  Mr. Goede has severe native 3-vessel coronary artery disease  and saphenous vein graft disease, status post coronary artery bypass in  1997 and subsequent stenting of his diagonal and posterior descending  vein graft.  He presents with small non-ST-segment elevation myocardial  infarction.  I agree with Dr. Swaziland that redo coronary artery bypass  graft surgery is probably the best treatment to prevent further ischemia  and infarction and improve his quality of life.  I discussed the  operative procedure with the patient including alternatives, benefits,  and risks including but not limited to bleeding, blood transfusion,  infection, stroke, myocardial infarction, renal failure requiring  dialysis, organ failure, and death.  He understands all of these and  agrees to proceed.  He has been on  Plavix chronically for his stenting procedures and we will plan on  awaiting about 5-7 days for Plavix washout.  At that time, we will  schedule him for next Monday assuming he remains stable on intravenous  heparin and nitroglycerin.  We will follow up with his renal function.      Evelene Croon, M.D.  Electronically Signed     BB/MEDQ  D:  02/23/2009  T:  02/24/2009  Job:  [id number]   cc:   Peter M. Swaziland, M.D.

## 2011-05-09 NOTE — H&P (Signed)
NAME:  [name] Proctor, [name] Proctor NO.:  [id number]   MEDICAL RECORD NO.:  [id number]          PATIENT TYPE:  INP   LOCATION:  3743                         FACILITY:  [facility]   PHYSICIAN:  Peter M. Swaziland, M.D.  DATE OF BIRTH:  03-Jan-1935   DATE OF ADMISSION:  06/09/2009  DATE OF DISCHARGE:                              HISTORY & PHYSICAL   HISTORY OF PRESENT ILLNESS:  [name] is a 76 year old white male well  known to me.  He presents for evaluation of syncope.  The patient was  just seen in our [facility] on June 07, 2009.  He noted at that  time he was having significant elevation of his blood pressure readings.  This was after we had stopped his nifedipine back in May.  We  recommended resuming his nifedipine, and he took his first dose  yesterday.  Subsequently last night, he blacked out approximately 10:00  p.m.  He cannot remember the event itself but states he fell over a  trash can and knocked it over.  He later woke up on the floor at 4:00  a.m.  He felt disoriented.  He tried to get up, but he was extremely  weak and lightheaded.  EMS was called.  They found the patient to be  hypotensive with blood pressure 94/58.  The patient has been treated for  congestive heart failure.  He has been on Lasix 80 mg twice a day.  His  most recent BUN dated May 06, 2009, was 60 and creatinine 2.5.  He has  had no change in bowel habits.  He has had no chest pain or shortness of  breath.   PAST MEDICAL HISTORY:  1. Coronary disease status post redo coronary artery bypass surgery in      March 2010.  This included a new saphenous vein graft to the obtuse      marginal vessel and a sequential vein graft to the PDA and      posterolateral branches.  He had a patent LIMA graft from his      original bypass surgery.  2. Sick sinus syndrome status post DDD pacemaker implant.  Pacemaker      check just 2 days ago was normal.  3. History of atrial flutter status post ablation  without recurrence.  4. Chronic renal insufficiency.  5. History of congestive heart failure due to diastolic dysfunction      and mitral insufficiency.  The patient had an echocardiogram on      April 05, 2009, which showed ejection fraction of 50% with mild      hypokinesia of the basal to mid anterior septum.  There was mild-to-      moderate left atrial enlargement.  Estimated PA pressure was 49      mmHg.  He also had mild-to-moderate aortic insufficiency.  6. History of renal artery stenosis status post left renal artery      stenting.  7. Diabetes mellitus type 2.  8. HIV positivity on antiviral therapy.  9. Hypertension.  10.Dyslipidemia.  11.Iron deficiency anemia.  12.Hypothyroidism.  MEDICATIONS PLACED:  The patient is unsure of his medications.  His  recent office records show inconsistent recordings.  He does appear to  be on aspirin 81 mg per day, metoprolol XL 100 mg per day.  He is on a  statin drug either pravastatin or fluvastatin daily.  He is on Zetia 10  mg per day.  He has been on nifedipine at what appears to be 30 mg per  day.  He is on a potassium supplement daily.  He was on Lasix 80 mg  twice a day and iron supplement daily.  He is on a sliding scale of  insulin.  He is on antiviral therapy but cannot remember the doses.   He is allergic to [facility] and DARVOCET.   SOCIAL HISTORY:  The patient lives alone.  He is single.  He is retired.  He denies history of tobacco or alcohol use.   FAMILY HISTORY:  Father died at age 12 with hypertension and possible  coronary disease.   REVIEW OF SYSTEMS:  He denies any increase in swelling or shortness of  breath.  He has had no orthopnea.  He has had no cough or fever.  He  denies any change in bowel habits.  He has had no dysuria.  All other  systems are reviewed and are negative.   PHYSICAL EXAMINATION:  GENERAL:  The patient is a well-developed white  male in no distress.  VITAL SIGNS:  His blood pressure is  95/60, pulse is 86 and regular,  sinus rhythm.  He is afebrile.  Sats are 100% on room air.  HEENT:  He is normocephalic, atraumatic.  His pupils are equal, round  and reactive.  Extraocular movements are full.  Oropharynx is clear.  NECK:  Supple without JVD, adenopathy, thyromegaly.  He has bilateral  cervical bruits.  LUNGS:  Clear.  CARDIAC:  A regular rate and rhythm.  Normal S1 and S2 without gallop,  murmur, or click.  ABDOMEN:  Soft and nontender.  He has positive bowel sounds.  There are  no masses or hepatosplenomegaly.  EXTREMITIES:  No edema.  His pedal pulses are palpable.  SKIN:  Warm and dry.  NEUROLOGIC:  He is alert and oriented x3.  He has no focal findings.   LABORATORY DATA:  White count 16,900, hemoglobin 11.5, hematocrit 35,  platelets 298,000.  Sodium 132, potassium 4.7, chloride 100, CO2 13, BUN  70, creatinine 3.9, glucose of 202.  Chest x-ray shows pulmonary  hypertension and cardiomegaly with no edema.  ECG shows normal sinus  rhythm with left axis deviation, LVH with repolarization abnormality.  Cranial CT shows mild atrophy with evidence of small vessel disease.  There is no evidence of acute stroke.   IMPRESSION:  1. Syncope due to dehydration with volume depletion.  I attribute this      to his diuretic therapy and recent addition of nifedipine with      further reduction in his blood pressure.  2. Hypotension secondary to above.  3. History of congestive heart failure due to diastolic dysfunction,      mitral insufficiency.  4. Coronary artery disease status post redo coronary artery bypass      graft.  5. Hypertension.  6. Acute on chronic renal insufficiency due to volume depletion.  7. Diabetes mellitus type 2.  8. Dyslipidemia.  9. Apparent history of hypothyroidism.  The patient reports he had      recent thyroid studies at the [facility]  which were abnormal.      Extent is unknown.   PLAN:  We will admit the patient to Telemetry.  We will  hold his  antihypertensive therapy including his nifedipine, Lasix, metoprolol.  He will be treated with IV hydration.  We will obtain serial cardiac  enzymes and repeat his thyroid function studies.  We will follow his  orthostatic blood pressure checks.  He will be covered with sliding  scale insulin.  We will need to identify and resume his antiviral and  statin therapy.           ______________________________  Peter M. Swaziland, M.D.     PMJ/MEDQ  D:  06/09/2009  T:  06/09/2009  Job:  [id number]   cc:   Haywood Pao, MD

## 2011-05-09 NOTE — Consult Note (Signed)
NAME:  [name] Proctor, [name] Proctor NO.:  [id number]   MEDICAL RECORD NO.:  [id number]          PATIENT TYPE:  INP   LOCATION:  3743                         FACILITY:  [facility]   PHYSICIAN:  Jordan Hawks. Elnoria Howard, MD    DATE OF BIRTH:  [birth year]   DATE OF CONSULTATION:  06/10/2009  DATE OF DISCHARGE:                                 CONSULTATION   REASON FOR CONSULTATION:  Iron deficiency anemia and melena.   HISTORY OF PRESENT ILLNESS:  This is a 76 year old gentleman with a past  medical history of coronary artery disease status post redo bypass graft  in March 2010, sick sinus syndrome, history of atrial flutter status  post ablation, chronic renal insufficiency, history of diastolic heart  failure, type 2 diabetes, HIV, renal artery stenosis, hypertension,  hyperlipidemia, hypothyroidism, esophagitis, gastritis, and duodenitis,  who was admitted to the hospital with a syncopal episode.  At this time,  the patient appears very weak, although he is oriented but not able to  provide a clear-cut history at this time.  From the chart, it appears  that he had a syncopal episode at home in the evening.  Apparently, he  does not know how long he was out.  When he woke up, he noted that the  trash can had fallen over and he noted that the time was 4:00 a.m.  Subsequently, EMS was called and he was brought to the emergency room.  He was noted to be hypotensive with a blood pressure at 94/58.  During  his hospitalization, he was also found to have acute renal insufficiency  on top of his chronic renal failure.  Per the patient's report, he says  that since his redo he has not felt well.  He denies taking any NSAID  medications, and no reports of any melena prior to this time.  He  reports having melena 1-2 days before his admission.  No complaints of  any abdominal pain.  On March 2007, he underwent an EGD and colonoscopy  for further evaluation of his iron deficiency anemia.  His  colonoscopy  was normal; however, his upper examination revealed that he had  esophagitis, gastritis, and duodenitis.  He was placed on a PPI which he  did take until 1 year ago where he states he did not notice any further  benefit with medications subsequently stopped.  At this time, he denies  having any overt symptoms of gastroesophageal reflux disease.  No issues  with dysphagia.   Past medical history and past surgical history is as stated above.   Family history is noncontributory.   SOCIAL HISTORY:  Negative alcohol, tobacco, illicit drug use.   REVIEW OF SYSTEMS:  Unable to adequately obtain at this time.   Allergies to [facility] and DARVOCET and ULTRAM.   MEDICATIONS:  1. Darunavir 600 mg p.o. b.i.d.  2. Zetia 10 mg p.o. daily  3. Iron sulfate 325 mg p.o. daily  4. Lescol 80 mg p.o. at bedtime.  5. Sliding-scale insulin.  6. Lamivudine 150 mg p.o. daily.  7. Levothyroxine 50 mcg p.o. daily.  8. Ritonavir 100 mg p.o. b.i.d.   PHYSICAL EXAMINATION:  VITAL SIGNS:  Blood pressure is 116/62, heart  rate is 84, respirations 18, temperature is 97.6.  GENERAL:  The patient it is in no acute distress, although he does  appear to be lethargic but he is oriented.  HEENT:  Normocephalic, atraumatic.  Extraocular muscles intact.  NECK:  Supple.  No lymphadenopathy.  LUNGS:  Clear to auscultation bilaterally.  CARDIOVASCULAR:  Regular rate and rhythm.  ABDOMEN:  Flat, soft, nontender, nondistended.  EXTREMITIES:  No clubbing, cyanosis, or edema.   LABORATORY VALUES:  White blood cell count is 12.7, hemoglobin 10.9,  platelets at 260.  Sodium 132, potassium 4.4, chloride 98, CO2 19,  glucose 156, BUN 96, creatinine is 5.0.   IMPRESSION:  1. Heme-positive stool.  2. Iron deficiency anemia.  3. History of esophagitis, duodenitis, and gastritis.  4. Dehydration.  5. Acute renal failure on top of chronic renal insufficiency.  6. Congestive heart failure.   In regards to the  gastrointestinal issue, he may have recurrence of his  esophagitis, gastritis,or duodenitis.  Further evaluation is warranted  at this time given that his hemoglobin has dropped some, and he does  complain of having melena.  Certainly, the initiation of PPI will be of  a benefit for his at this time.  Further recommendations will be made  pending the findings of the EGD.   TENTATIVE PLAN:  1. Plan is tentatively to schedule the patient for an EGD tomorrow      morning.  2. To start Protonix 40 mg p.o. daily.      Jordan Hawks Elnoria Howard, MD  Electronically Signed     PDH/MEDQ  D:  06/10/2009  T:  06/11/2009  Job:  [id number]

## 2011-05-09 NOTE — Discharge Summary (Signed)
NAME:  [name] Proctor, [name] Proctor NO.:  [id number]   MEDICAL RECORD NO.:  [id number]          PATIENT TYPE:  INP   LOCATION:  3735                         FACILITY:  [facility]   PHYSICIAN:  Alvester Morin, M.D.  DATE OF BIRTH:  07/16/1935   DATE OF ADMISSION:  07/10/2009  DATE OF DISCHARGE:  07/12/2009                               DISCHARGE SUMMARY   DISCHARGE DIAGNOSES:  1. Hypokalemia.  2. Bilateral lower extremity edema.  3. Congestive heart failure.  4. Hyponatremia.  5. Anemia.  6. Chronic renal insufficiency.  7. Diabetes mellitus.  8. Hypothyroidism.  9. Human immunodeficiency virus.  10.Coronary artery disease, status post coronary artery bypass graft.  11.Sick sinus syndrome, status post ablation secondary to atrial      flutter, status post pacemaker placement.   DISCHARGE MEDICINES:  1. Iron 325 one tablet by mouth daily.  2. Aspirin 81 mg one tablet by mouth daily.  3. Zetia 10 mg one tablet by mouth daily.  4. Omeprazole 20 mg by mouth twice daily.  5. Synthroid 50 mcg by mouth once daily.  6. Sliding-scale insulin as needed according to capillary blood      glucoses.  7. Furosemide 40 mg two tablets by mouth twice a day.  8. Potassium chloride 40 mg one tablet mouth once a day.  9. Carvedilol 6.25 mg two tablets by mouth twice daily.  10.Lamivudine 150 mg by mouth once daily.  11.Ritonavir 100 mg by mouth twice daily with meals.  12.Darunavir 600 mg by mouth once daily.  13.Amoxicillin 1000 mg by mouth twice daily.  14.Clarithromycin 500 mg by mouth twice daily.  15.Etravirine 100 mg two tablets by mouth twice daily after meals.   DISPOSITION AND FOLLOWUP:  The patient should be seen by Dr. Haywood Pao at the [facility], their phone number for appointment is [phone].  The [facility] was called, but no appointment could be made at  that time with the office staff, and a message was left to call Dr.  Logan Bores to facilitate that appointment, and  the patient was also advised  to make an appointment within the coming week in order to have a basic  metabolic panel checked regarding his potassium level and creatinine.  Also, the patient has an appointment with Dr. Swaziland at [facility], phone number [phone].  He had an appointment scheduled  for August 05, 2009, at 2:45 and he was advised to keep that appointment  for followup with Dr. Swaziland.   PROCEDURES PERFORMED:  None.   CONSULTATIONS:  None.   PRIMARY CARE PHYSICIAN:  Dr. Haywood Pao at the [facility], and a  cardiologist, Dr. Swaziland at [facility].   CHIEF COMPLAINT:  Swelling legs.   HISTORY OF PRESENT ILLNESS:  A 76 year old man with a past medical  history of CHF, coronary artery disease, hypertension, HIV, diabetes  mellitus type 2, came to the ED due to leg swelling, which began  yesterday.  He stated that he had called his cardiologist Dr. Swaziland to  report this problem, and Dr. Swaziland advised  him to take only one extra  dose of Lasix, which he normally takes 80 mg twice daily, he took only  one extra dose, but states it had no effect on the swelling.  He has  taken Lasix ever since his redo CABG surgery back in March 2010.  The  patient does deny any weight gain, shortness of breath, chest pain,  nausea, vomiting, diarrhea, cough, or headache, but he does report  having some feeling of bloating or just his belly feeling bigger and  general-type symptoms.  Otherwise, the patient has had no complaints  regarding orthopnea or dyspnea on exertion.   PHYSICAL EXAMINATION:  VITAL SIGNS:  On admission; temperature 97.2,  blood pressure 155/93, heart rate of 86, respiratory rate of 16, and  oxygen saturation of 98% on room air.  GENERAL:  No acute distress, appears stated age or younger.  HEENT:  Eyes anicteric without injection.  No ecchymosis.  Pupils are  equal, round, and reactive to light.  NECK:  No cervical lymphadenopathy, supple,  full range of motion.  RESPIRATORY:  Clear to auscultation bilaterally with no wheezes, rales,  or rhonchi.  CARDIOVASCULAR:  Regular in rate and rhythm with a 2/6 systolic murmur  radiating to the axilla.  No rubs or gallops appreciated on exam.  GASTROINTESTINAL:  Soft, nontender, nondistended with positive bowel  sounds with no rebound or guarding.  EXTREMITIES:  2+ radial pulses, 2+ lower extremity edema to knees  bilaterally.  SKIN:  Some ecchymosis on stomach secondary to insulin injections.  NEUROLOGIC:  Alert and oriented x3 with cranial nerves II through XII  intact.  No gross focal deficits, and moves all extremities.  PSYCHIATRY:  Appropriate.   INITIAL LABS ON ADMISSION:  EKG showed sinus rhythm with a first-degree  block with an increased PR interval, left axis deviation, poor R-wave  progression and U waves in the lateral leads, especially noticeable in  V5.  Basic metabolic panel showed a sodium of 132, potassium 2.7,  chloride 99, bicarb 25, BUN of 48, creatinine of 2.27, and a glucose of  53.  Magnesium was checked and was 2.6.  CBC was as follows; white blood  cells 7.7, hemoglobin 10.0, hematocrit 29.3, and platelets of 162 with a  MCV of 78.8.  Calcium was 8.3.   HOSPITAL COURSE BY PROBLEM:  1. Bilateral lower extremity edema.  This was the patient's chief      complaint on admission, and after 1 day of treatment of the      hypokalemia, the patient's lower extremity edema appeared to have      resolved the next morning.  This was most likely due to the patient      being in a supine position with his legs elevated.  The following      day on exam, the patient's swelling had returned, and this is most      likely due to venostasis secondary to venous insufficiency.  The      patient was advised that compression stockings and leg elevation      would be the best treatment of choice at this time given the      results of lab tests to verify the status of his  hydration during      the hospital stay.  Urine sodium and creatinine were measured and      FeNa was calculated to be less than 0.07 giving the impression that      the patient was intravascularly  volume depleted, although he still      had the lower extremity edema.  2. Hypokalemia.  This was most likely secondary to receiving an extra      dose of Lasix taken yesterday.  The patient denies any GI losses      such as nausea, vomiting, or diarrhea.  The patient was given one      run of potassium 10 mEq IV in the emergency department and      afterward was given several doses of potassium by mouth of 40 mEq.      We avoided giving additional fluids given his history of past      congestive heart failure exacerbations, and we decided to recheck      basic metabolic panels later after admission to verify that      potassium was returning to normal levels following administration      of the oral potassium replacements.  The patient's potassium did      return to normal after 3 or 4 doses of the oral potassium plus the      one run of potassium received in the emergency department.  3. Congestive heart failure.  Lasix was withheld initially to replete      the potassium.  Aspirin, beta-blocker, and statin were continued,      and he was placed on a heart-healthy diet with fluid restrictions.      The patient was returned to his Lasix dose of 80 mg twice daily      before discharge and was given instructions to follow up with his      cardiologist before making any changes to his home medicine      regimen.  4. Hyponatremia.  This was most likely secondary to his congestive      heart failure, and this was stable throughout the hospitalization      with his sodium on discharge being 133.  5. Anemia.  CBC at admission was 10.0, which was consistent with a CBC      done in June 2010 for another admission that the patient had, his      hemoglobin at that time was 10.9.  An anemia panel was  drawn during      the hospitalization and showed an iron level of 24, and percent      saturation of 8, which seems to be somewhat consistent with iron      deficiency anemia, although the ferritin level was 70.  At this      time, no further workup was initiated given the patient's      improvement, and this should be followed as an outpatient.  6. Chronic renal insufficiency.  Creatinine on admission was 2.27, and      going back through the patient's records for the past several      months, his creatinine ranged anywhere from 3.5 to around 5.  Lasix      was held initially, but restarted once the hypokalemia was resolved      and remained stable throughout the hospitalization at 2.48 on      discharge.  7. Diabetes.  Given the patient's hypokalemia, his sliding-scale      insulin was held.  Initially, plan was to start the patient on      sensitive sliding scale if his CBGs were uncontrolled, but      throughout the hospitalization, the patient had one CBG of 200, and  all others were within the 100 range.  8. Hypothyroidism.  TSH and T4 were checked given some confusion      regarding the correct dosage of the patient's Synthroid.  This was      being adjusted by the patient's primary care physician.  The TSH in      the hospital was 7.073, but a free T4 was just above the cutoff for      low at 0.81.  The cutoff being 0.80.  Throughout the      hospitalization we did give the patient dose of Synthroid 50 mcg as      that was the last dose that we could find in a previous discharge      summary.  This should be further evaluated or readdressed as an      outpatient with the patient's primary care physician.  9. Human immunodeficiency virus.  We continued the patient on his      highly active antiretroviral therapy.   DISCHARGE LABS AND VITALS:  Vitals on day of discharge were as follows;  temperature 97.9, heart rate 73, respiratory rate 18, blood pressure  147/95 with an  oxygen saturation of 99% on room air.  Labs at discharge;  phosphorus 3.2, magnesium 2.5.  Basic metabolic panel showed a sodium of  133, potassium 4.2, chloride 101, bicarb 23, BUN 45, creatinine 2.38,  and a glucose of 159 with a calcium of 8.4.  CBC showed white blood  cells of 8.7, hemoglobin 10.1, hematocrit 29.9, and platelet count of  140.      Nilda Riggs, MD  Electronically Signed      Alvester Morin, M.D.  Electronically Signed    ME/MEDQ  D:  07/12/2009  T:  07/13/2009  Job:  [id number]   cc:   Durenda Age, M.D.  Peter M. Swaziland, M.D.

## 2011-05-09 NOTE — Discharge Summary (Signed)
NAME:  [name] Proctor, [name] Proctor NO.:  [id number]   MEDICAL RECORD NO.:  [id number]          PATIENT TYPE:  INP   LOCATION:  2041                         FACILITY:  [facility]   PHYSICIAN:  Cassell Clement, M.D. DATE OF BIRTH:  Aug 24, 1935   DATE OF ADMISSION:  06/09/2009  DATE OF DISCHARGE:  06/16/2009                               DISCHARGE SUMMARY   FINAL DIAGNOSES:  1. Syncope, secondary to dehydration, secondary to volume depletion,      secondary to diuretic therapy and recent addition of nifedipine.  2. Hypotension, secondary to volume depletion.  3. Past history of congestive heart failure due to diastolic      dysfunction and mitral regurgitation.  4. Status post redo coronary artery bypass graft surgery.  5. Hypertensive cardiovascular disease.  6. Acute-on-chronic renal insufficiency.  7. Diabetes mellitus, type 2.  8. Dyslipidemia.  9. Hypothyroidism.  10.Gastrointestinal bleed, secondary to gastritis, esophagitis and      multiple shallow clean based ulcers in the duodenum.   OPERATIONS PERFORMED:  Upper endoscopy by Jordan Hawks. Elnoria Howard, MD.   HISTORY:  This 76 year old gentleman known to Dr. Swaziland, presented with  syncope.  He has a functioning pacemaker.  This is for sick sinus  syndrome.  He has a past history of atrial flutter, status post  ablation.  He is not on Coumadin.  His last pacemaker check was on June 07, 2009, and his pacemaker was doing well and at that time he was noted  to have increased blood pressure and his nifedipine was resumed.  He  took his first dose on the day prior to this admission.  Last night, he  blacked out at approximately 10:00 p.m., woke up around 4:00 a.m. on the  floor was disoriented, was weak and lightheaded, EMS was called.  He was  found to be hypotensive with blood pressure of 94/58.  He was brought to  the emergency room where he was evaluated and admitted.  His BUN was  found to be 70, creatinine 3.9, sodium 132, and  potassium 4.7.  Chest x-  ray showed cardiomegaly, but no edema and EKG showed sinus rhythm.  Cranial CT showed only mild atrophy, but no evidence of acute stroke.   HOSPITAL COURSE:  The patient was felt to be volume depleted and was  hydrated intravenously.  He was seen in consultation on day after  admission by renal.  He was found to have a heme-positive stool and was  seen by Dr. Jeani Hawking.  He was initially scheduled for upper  endoscopy on the morning of June 11, 2009.  However, with rehydration he  went into acute congestive failure, pulmonary edema, and the endoscopy  had to be postponed until he was more stable.  It was felt that he had  flash pulmonary edema and at that point he was taken off IV  resuscitation and given IV Lasix for diuresis.  He responded to IV  Lasix.  Renal continued to follow him closely.  By June 12, 2009, he was  significantly improved.  However, BUN had gone up to  103 and creatinine  to 5.29.  White count was also elevated at 15,200.  He was found to have  a urinary tract infection and was placed on Cipro.  He remained afebrile  and his white count improved on Cipro.  Chest x-ray has raised question  of possible pneumonia, but cleared without the addition of an additional  antibiotic.  Chest x-ray 1 day prior to discharge showed no infiltrate  and no significant fluid accumulation.  The patient did undergo the  postponed endoscopy on June 13, 2009.  It did show esophagitis,  gastritis, and duodenal ulcers.  The patient was told that he would need  to stay on proton pump inhibitors indefinitely.  His respiratory status  continued to improve.  He was able to be transferred to 2000.  At the  time of discharge, his renal function was continuing to improve slowly  as he remained on Lasix 80 mg a day.   On the day of discharge; hemoglobin is 10.9, hematocrit 32, white count  8000, and platelets 135,000.  On the day of discharge; sodium 136,  potassium  4.0, chloride 100, CO2 of 28, BUN 75, and creatinine 3.53.  Blood sugar 104.  One day prior to discharge, stool for occult blood was  negative.   The patient is being discharged in improved to home.  The home health  nurse will be following him.  He will be on a low-sodium heart-healthy  diet.  He will see Dr. Swaziland in 2 weeks and he is to call for an  appointment.   DISCHARGE MEDICATIONS:  1. Ferrous sulfate 325 one daily.  2. Baby aspirin 81 mg daily starting on June 21, 2009, for his      ischemic heart disease.  3. Zetia 10 mg daily.  4. Synthroid 50 mcg daily.  5. Insulin by sliding scale twice a day.  6. Norvir 100 mg twice a day.  7. Epivir 150 mg daily.  8. Prezista 600 mg twice a day.  9. Etravirine 100 mg twice a day.  10.Lescol 80 mg at bedtime.  11.Protonix 40 mg twice a day.  12.Lasix 80 mg daily.  13.Nitrostat p.r.n.  14.Tylenol p.r.n.   CONDITION ON DISCHARGE:  Improved.           ______________________________  Cassell Clement, M.D.     TB/MEDQ  D:  06/16/2009  T:  06/16/2009  Job:  [id number]   cc:   Peter M. Swaziland, M.D.  Jordan Hawks Elnoria Howard, MD  Garnetta Buddy, M.D.

## 2011-05-09 NOTE — H&P (Signed)
NAME:  [name] Proctor, HERD NO.:  [id number]   MEDICAL RECORD NO.:  [id number]          PATIENT TYPE:  INP   LOCATION:  4713                         FACILITY:  [facility]   PHYSICIAN:  Lyn Records, M.D.   DATE OF BIRTH:  18-Sep-1935   DATE OF ADMISSION:  04/04/2009  DATE OF DISCHARGE:                              HISTORY & PHYSICAL   REASON FOR ADMISSION:  Shortness of breath.   SUBJECTIVE:  The patient is a 76 year old gentleman with a longstanding  history of coronary artery disease who is followed by Dr. Peter Swaziland.  He underwent coronary artery bypass surgery in 1977 by Dr. Sheliah Plane.  He has a history of renal vascular disease and is status post  left renal artery stenting.  He also has a history of chronic renal  insufficiency.  There is a history of sick sinus syndrome with permanent  pacemaker.  Because of increasing angina, he underwent catheterization  in February and was found to have significant bypass graft occlusive  disease.  He subsequently underwent bypass surgery on March 01, 2009 by  Dr. Evelene Croon.  This operation included a saphenous vein graft to an  obtuse marginal of the left circumflex, a sequential saphenous vein  graft to the posterior descending and posterolateral branch of the right  coronary.  From his prior operation, the left internal mammary graft to  the LAD was patent and the saphenous vein graft to the PDA was patent,  however, there was high-grade significant obstruction in the PDA.   The patient has a prior history of atrial flutter and has undergone  atrial flutter ablation in the past.   This morning at around 2:00 a.m., he awakened with dyspnea and had to  sit up.  He felt like his heart was racing.  This continued until he  presented to the emergency room at around 10:00 a.m..  He was in sinus  rhythm.  He was deemed to be in heart failure.  IV Lasix was given.  He  now feels much better.  He is being admitted to the  hospital for  management of congestive heart failure.   SIGNIFICANT MEDICAL PROBLEMS:  1. Coronary atherosclerotic heart disease with bypass surgery in 1997      and repeated in 2010.  History of Taxus drug-eluting stent to the      diagonal graft and the PDA graft.  The most recent bypass surgery      was to bypass territories that were ischemic after progressive      bypass graft occlusion.  2. History of atrial flutter status post atrial flutter ablation and      permanent pacemaker insertion by Dr. Reyes Ivan.  3. Diabetes mellitus.  4. Renal insufficiency.  5. Renal artery stenosis status post left renal artery stent.  6. Hypertension.  7. Hyperlipidemia.  8. History of HIV, not currently on medical therapy.   ALLERGIES:  SELDANE and DARVOCET.   SOCIAL HISTORY:  He is a retired [employer] administrator.  He is divorced.  He  denies alcohol use, drug use, and tobacco  use.   FAMILY HISTORY:  His father died at age 37 of possible complications of  vascular disease.   MEDICATIONS:  His current medical regimen after discharge from hospital  included:  1. Furosemide 80 mg twice a day.  2. Zetia 10 mg per day.  3. Clopidogrel 75 mg per day.  4. Baby aspirin 1 tablet per day.  5. Nifedipine XL 90 mg per day.  6. Metoprolol succinate 100 mg per day.  7. Potassium chloride 20 mEq per day.   REVIEW OF SYSTEMS:  Otherwise unremarkable.  Had been recovering quite  nicely from surgery until he suddenly became short of breath this  morning.   PHYSICAL EXAMINATION:  GENERAL:  The patient is in no distress.  He is  sitting on a chair in the emergency room #30 watching the baseball game.  He is not dyspneic.  VITAL SIGNS:  Blood pressure 140/70 and heart rate is 80.  NECK:  He has moderate JVD while sitting at 90 degrees.  LUNGS:  Rales at both bases.  CARDIAC:  No murmur or rub.  ABDOMEN:  Soft.  Bowel sounds are normal.  EXTREMITIES:  No edema.  Pulses are 2+ and symmetric in upper and  lower  extremities.   IMAGING:  EKG demonstrates left anterior hemiblock and normal sinus  rhythm.  No acute ST-T wave changes noted.   LABORATORY DATA:  Pending.   ASSESSMENT:  1. Congestive heart failure of uncertain cause.  The patient is known      to have mild left ventricular dysfunction with an ejection fraction      of 40% by catheterization prior to recent bypass surgery.  Rule out      some sort of an arrhythmia that could have precipitated this event.  2. Human immunodeficiency virus, on no therapy.  3. Hypertension.  4. Renal insufficiency, rule out progression.   PLAN:  1. IV Lasix.  2. Check echo.  3. Further management per Dr. Swaziland.      Lyn Records, M.D.  Electronically Signed     HWS/MEDQ  D:  04/04/2009  T:  04/05/2009  Job:  [id number]   cc:   Evelene Croon, M.D.

## 2011-05-12 NOTE — Cardiovascular Report (Signed)
NAME:  [name], [name] NO.:  [id number]   MEDICAL RECORD NO.:  [id number]          PATIENT TYPE:  INP   LOCATION:  4733                         FACILITY:  [facility]   PHYSICIAN:  Elmore Guise., M.D.DATE OF BIRTH:  [birth year]   DATE OF PROCEDURE:  DATE OF DISCHARGE:                              CARDIAC CATHETERIZATION   PROCEDURE:  Dual-chamber pacemaker implant.   DATE OF PROCEDURE:  [id number]   INDICATIONS FOR PROCEDURE:  slow atrial flutter with high degree AV nodal  disease.   DESCRIPTION OF PROCEDURE:  The patient brought to the cardiac cath lab after  appropriate informed consent.  He was prepped and draped in a sterile  fashion.  Approximately 20 mL of 1% lidocaine was used for local anesthesia.  A 2 inch incision was made in the left deltopectoral groove.  A subcutaneous  pocket was then made with blunt and Bovie dissection.  A venogram was then  performed showing the root of the left axillary/subclavian vein.  The left  axillary vein was then accessed in two separate sticks under fluoroscopic  guidance.  Two 7-French safety sheaths were placed over the retained wires.  The ventricular lead was then placed.  It is a Medtronic active fixation,  serial number [id number]. The following measurements were obtained.  Threshold was 0.7 volts at 0.5 milliseconds with impedance of 1479 ohms.  R  waves measured 9.4 mV with a current of 0.5 mA. The atrial lead was then  placed. It is a Medtronic [id number] cm, serial number PJN  [id number]. Following  measurements were obtained, threshold 0.8 volts at 0.5 milliseconds,  impedance of 563 ohms with P-waves measured 7.5 mV in a current of 1.6 mA.  The pocket was then copiously irrigated with Kanamycin solution.  The atrial  and ventricular lead were identified and placed in the appropriate header on  a Medtronic Enrhythm, P 1501 DR, serial number [id number] pacemaker  generator.  The generator was sewn into the pocket.   Pocket was then closed  with 2-0 followed by 2-0 followed by a 4-0 Vicryl suture.  Steri-Strips were  placed over the wound.  The patient tolerated procedure well.  He came in  the procedure in atrial flutter and was paced out during the procedure.  He  is now in normal sinus rhythm.  No apparent complications.  He was  transferred from the cardiac cath lab in stable condition.      Elmore Guise., M.D.  Electronically Signed     TWK/MEDQ  D:  07/03/2006  T:  07/03/2006  Job:  [id number]   cc:   Peter M. Swaziland, M.D.  Fax: [phone]

## 2011-05-12 NOTE — Discharge Summary (Signed)
NAME:  [name] Proctor, [name] Proctor NO.:  [id number]   MEDICAL RECORD NO.:  [id number]          PATIENT TYPE:  INP   LOCATION:  2008                         FACILITY:  [facility]   PHYSICIAN:  Peter M. Swaziland, M.D.  DATE OF BIRTH:  Oct 31, 1935   DATE OF ADMISSION:  12/04/2006  DATE OF DISCHARGE:  12/10/2006                               DISCHARGE SUMMARY   [name] Proctor is a 76 year old white male who has a history of coronary  artery disease.  He has had prior coronary artery bypass surgery in  1997.  He has had previous stenting of the vein graft in 2005 and again  in March 2007.  He has had previous episode of atrial flutter with slow  ventricular response in July 2007 and underwent DDD pacemaker placement  at that time and was paced out of atrial flutter.  He has had no further  arrhythmias since then but presented at  this time with symptoms of  congestive heart failure.  He had increasing dyspnea, orthopnea, and  edema for a 2-day period with fairly sudden onset.  He had no associated  chest pain.  The patient was found to be back in atrial flutter on  admission and was admitted for further management and treatment.  For  details of his past medical history, social history, family history, and  physical exam, please see admission history and physical.   LABORATORY DATA:  Initial chest x-ray showed stable pacemaker placement.  He had bilateral pleural effusions right greater than left and  mild  interstitial edema.  His ECG demonstrated atrial flutter with a  controlled ventricular response.  He has ST and T-wave changes  consistent with anterolateral ischemia.  Additional laboratory data:  pH  of 7.47, pCO2 of 30, bicarbonate  22, white count was 8500, hemoglobin  11.5, hematocrit 34.4, platelets 236,000.  Pro time was 14.6 with an INR  of 1.1, PTT was 33, sodium is 140, potassium 3.3, chloride 103, CO2 22,  glucose 148, BUN is 47, creatinine 2.4.  Magnesium was 2.4.  BNP level  was 995.  Serial cardiac enzymes including CPK-MBs and troponins were  negative x3.  Urinalysis was normal.   HOSPITAL COURSE:  The patient was admitted to telemetry.  He was treated  with IV heparin and IV Lasix.  He was successfully diuresed.  He was  initiated on Coumadin p.o.  He was not a candidate for an ACE inhibitor  due to his chronic renal insufficiency.  An echocardiogram was obtained.  This demonstrated normal left ventricular size and function with  ejection fraction of 55-60%.  There was mild hypokinesia of the apical  septum.  Left ventricular wall thickness was upper limits of normal.  He  had mild aortic sclerosis and mild aortic insufficiency.  There was mild  mitral and tricuspid insufficiency.  Left atrium was moderately dilated.  Right ventricular systolic pressure was mild to moderately increased.  Based on these findings, it was felt that his congestive heart failure  was based on recurrent atrial flutter and diastolic dysfunction.  In  reviewing options for  treatment of his atrial flutter, it was felt that  he was not a candidate for 1C antiarrhythmic drugs due to his history of  coronary disease.  He was also not felt to be a candidate for Tikosyn  and Betapace due to his chronic renal insufficiency.  Amiodarone was  problematic given the fact that he is on antiviral therapy for HIV and  the potential interaction and liver toxicity was felt to be risky.  The  patient was seen in consultation by initially Dr. Graciela Husbands and then Dr.  Ladona Ridgel who recommended an atrial flutter ablation procedure.  In  reviewing his initial pacemaker, it was found that he had been in atrial  flutter since October.  Given this length of time, it was recommended he  undergo a transesophageal echo prior to attempted flutter ablation.  TEE  was performed successfully and it showed no intracardiac thrombus.  There was some spontaneous echo contrast in the left atrium.  The  patient did have  significant aortic atherosclerotic disease with a large  atherosclerotic plaque at the arch with mobile component.  The patient  subsequently underwent successful EP study and was found to have typical  atrial flutter.  This was successfully ablated using radiofrequency  ablation with 4 treatments to the usual atrial flutter isthmus.  The  patient subsequently had sinus rhythm restored with a first-degree AV  block.  He continued to diurese well and was changed to oral Lasix.  He  was placed on Coumadin and on the day of discharge, his INR was 1.6.  It  was felt that he was stable at this point for continued outpatient  therapy and he was discharged home on December 10, 2006.  At the time of  discharge, his sodium was 139, potassium 3.6, chloride 102, CO2 29, BUN  36, creatinine 2.2, glucose 100.  His pro time was 20.1 with an INR of  1.6.   DISCHARGE DIAGNOSES:  1. Atrial flutter, recurrent.  2. Congestive heart failure secondary to atrial flutter and diastolic      dysfunction acute.  3. Coronary artery disease status post coronary artery bypass surgery      in 1997 status post prior stenting procedures to the vein graft in      2005 and March 2007.  4. Status post permanent dual-mode, dual-pacing, dual-sensing      pacemaker placement.  5. Hypertension.  6. Dyslipidemia.  7. Diabetes mellitus.  8. Chronic renal insufficiency.  9. Chronic anemia.  10.Peripheral vascular disease status post renal artery stenting.  11.A history of human immunodeficiency virus positivity.   DISCHARGE MEDICATIONS:  1. Metoprolol 50 mg daily.  2. Coated aspirin 81 mg daily.  3. Coumadin 10 mg today and then 7.5 mg daily.  4. Zetia 10 mg daily.  5. Nifedipine 30 mg daily.  6. C-omega 1200 mg 3 tablets daily.  7. Simvastatin 80 mg per day.  8. Lasix 40 mg twice daily.  9. Ritonavir 100 mg daily.  10.Atazanavir 150 mg 2 tablets daily.  11.Epzicom/abacavir 600 mg daily. 12.Lamivudine 300 mg  daily.   The patient was instructed on a low-salt diet.  He is to weigh himself  daily.  He was given congestive heart failure instructions.  He will  have followup pro time on December 21 at Dr. Elvis Coil office and a  followup office visit in 2-3 weeks.  His discharge status is improved.  Of note, we will hold his Plavix while he is anticoagulated with  Coumadin  and we will resume Plavix once he is able to come off of  Coumadin in approximately 2 months.           ______________________________  Peter M. [place]il[place], M.D.     PMJ/MEDQ  D:  12/10/2006  T:  12/10/2006  Job:  [id number]   cc:   Doylene Canning. Ladona Ridgel, MD  [facility]

## 2011-05-12 NOTE — Op Note (Signed)
NAME:  [name] Proctor, [name] Proctor NO.:  [id number]   MEDICAL RECORD NO.:  [id number]          PATIENT TYPE:  INP   LOCATION:  6527                         FACILITY:  [facility]   PHYSICIAN:  Doylene Canning. Ladona Ridgel, MD    DATE OF BIRTH:  06-12-1935   DATE OF PROCEDURE:  12/07/2006  DATE OF DISCHARGE:                               OPERATIVE REPORT   PROCEDURE PERFORMED:  Electrophysiologic and catheter ablation of atrial  flutter.   INTRODUCTION:  The patient is a very pleasant 76 year old man with a  history of coronary artery disease status post bypass surgery, history  of symptomatic bradycardia status post pacemaker insertion, who was in  the usual state of health when he presented with heart failure and  atrial flutter.  He underwent a transesophageal echo which demonstrated  no left atrial appendage thrombus and he is now referred for  electrophysiologic study and catheter ablation of atrial flutter.   PROCEDURE:  After informed consent was obtained, the patient was taken  to the diagnostic EP lab in a fasting state.  After the usual  preparation and draping, a 6-French hexapolar catheter was inserted  percutaneously in the right jugular vein and advanced to the coronary  sinus.  A 5-French quadripolar catheter was entered percutaneously in  the right femoral vein and advanced into the His bundle region.  A 7-  French 24 halo catheter was inserted percutaneously in the right femoral  vein and advanced to the right atrium.  After measurement of the basic  intervals, mapping was carried out demonstrating typical counter  clockwise tricuspid annular reentrant atrial flutter, cycle length was  230 milliseconds.  The 7-French quadripolar ablation catheter was  inserted percutaneously in the right femoral vein and advanced to the  right atrium.  Four RF energy applications were delivered to the usual  atrial flutter isthmus resulting in termination of atrial flutter,  restoration sinus  rhythm, pacing was then carried out demonstrating  isthmus conduction.  Several additional RF energy applications were  delivered (a total of four RF energy applications were delivered during  the entire procedure).  This resulted in termination of atrial flutter  and creation of bidirectional block and atrial flutter isthmus.  Rapid  ventricular pacing was then carried out demonstrating VA dissociation at  600 milliseconds.  Rapid atrial pacing was carried out demonstrating AV  Wenckebach at 600 milliseconds.  With all of the above, the patient was  observed for 30 minutes and during this time had rapid atrial pacing  from the coronary sinus demonstrating persistent atrial flutter isthmus  block.  At this point, the catheters were removed, hemostasis was  assured, and the patient was returned to his room in satisfactory  condition.  Of note, the patient's pacemaker was interrogated after the  procedure.   COMPLICATIONS:  There were no immediate procedure complications.   RESULTS:  1. Baseline ECG:  The baseline ECG demonstrates atrial flutter with a      controlled ventricular response.  2. Baseline intervals. The atrial flutter cycle length was 248      milliseconds.  The HV interval was 35 milliseconds.  The QRS      duration was 88 milliseconds.  3. Rapid ventricular pacing:  Rapid ventricular pacing was carried out      following ablation demonstrating VA dissociation at 600      milliseconds.  4. Programmed ventricular stimulation:  Programmed ventricular      stimulation was carried out from the RV apex after ablation      demonstrating VA dissociation at 600 milliseconds.  5. Rapid atrial pacing:  Rapid atrial pacing was carried out from the      coronary sinus in the high right atrium demonstrating AV Wenckebach      at cycle lengths of 600 milliseconds or greater.  6. Programmed atrial stimulation:  Programmed atrial stimulation      demonstrated AV Wenckebach at 600  milliseconds.  7. Arrhythmias observed:      a.     Atrial flutter:  The rhythm was present at the time of EP       study, duration was sustained, termination was with catheter       ablation.      b.     AH mapping:  Mapping atrial flutter isthmus demonstrated the       usual size and orientation.  8. RF energy application:  A total of four RF energy applications were      delivered to the usual atrial flutter isthmus resulting in      termination of atrial flutter, restoration of sinus rhythm,      creation of bidirectional block in the atrial flutter isthmus.   CONCLUSION:  This study demonstrates successful electrophysiologic study  and RF catheter ablation with a total of four RF energy applications and  the usual atrial flutter isthmus resulting in termination of atrial  flutter, restoration of sinus rhythm, creation of bidirectional block  and atrial flutter isthmus.      Doylene Canning. Ladona Ridgel, MD  Electronically Signed     GWT/MEDQ  D:  12/07/2006  T:  12/07/2006  Job:  [id number]   cc:   Peter M. [place], M.D.

## 2011-05-12 NOTE — H&P (Signed)
NAME:  [name] Proctor, [name] Proctor NO.:  [id number]   MEDICAL RECORD NO.:  [id number]            PATIENT TYPE:   LOCATION:                                 FACILITY:   PHYSICIAN:  [name] Proctor, M.D.       DATE OF BIRTH:   DATE OF ADMISSION:  03/06/2006  DATE OF DISCHARGE:                                HISTORY & PHYSICAL   HISTORY OF PRESENT ILLNESS:  [name] Proctor is a 76 year old white male with known  history of coronary artery disease.  He is status post coronary artery  bypass surgery in 1997.  In September 2005, he underwent stenting of  saphenous vein graft to the diagonal branch.  His other grafts were patent  at that time.  He had mild left ventricular dysfunction.  He had a 3 x 32 mm  Taxus stent used at that time.  He had done well up until the past 3 months  when he has been noticing symptoms of increasing exertional angina.  He  describes this predominantly as a left arm pain that occurs with exertion,  radiating down to his hand and sometimes radiating over into his chest.  It  is relieved with sublingual nitroglycerin and rest.  Because of his  increasing anginal symptoms and known coronary disease, he is now admitted  for cardiac catheterization.   PAST MEDICAL HISTORY:  1.  Coronary artery disease as noted above.  Initial coronary artery bypass      surgery in August 1997 was performed by Dr. Tyrone Proctor included an LIMA      graft to the LAD, vein graft to the diagonal, a vein graft to the      intermediate vessel, and a vein graft to the PDA.  2.  History of renal artery stenosis status post left renal artery stenting      by Dr. Edilia Proctor in the summer of 2006.  3.  Diabetes mellitus, type 2.  4.  Hypercholesterolemia.  5.  Chronic PVCs.  6.  Hypertension.  7.  History of HIV positivity on chronic antiviral therapy.   ALLERGIES:  He is allergic to [name] and [name] Proctor.   MEDICATIONS:  Include:  1.  Lopinavir/ritonavir 133.3/33.3 two tablets twice a day.  2.  Abacavir 600 mg per day.  3.  Lamivudine 300 mg per day.  4.  Coated aspirin 325 mg per day.  5.  Metoprolol 50 mg twice daily.  6.  Lisinopril 10 mg per day.  7.  Fluvastatin 80 mg per day.   SOCIAL HISTORY:  He is a retired Web designer.  He denies tobacco and  alcohol use.  He is divorced.  He has one daughter.   FAMILY HISTORY:  Father died at age 71 with hypertension.  Mother died of  renal failure at age 78 and also had a myocardial infarction at age 77.  One  brother is status post re-do CABG.  On sister is status post carotid  endarterectomy.   REVIEW OF SYSTEMS:  The patient's viral titers have been well controlled.  He has had  no recent infections or increased adenopathy.  He has had no  bleeding disorders.  Review of Systems otherwise unremarkable.   PHYSICAL EXAMINATION:  GENERAL: The patient is a well-developed white male  in no distress.  VITAL SIGNS:  Weight 186.  Blood pressure 150/88, pulse 80 and regular.  NECK: No jugular venous distention or bruits.  He has no adenopathy or  thyromegaly.  HEENT:  Exam is unremarkable.  LUNGS: Clear.  CARDIAC: Regular rate and rhythm with a soft 1/6 systolic murmur in the  apex.  He has no S3.  ABDOMEN: Soft, nontender.  He has no hepatosplenomegaly, masses, or bruits.  PULSES:  Femoral and pedal pulses are 2+ and symmetric.  NEUROLOGIC:  Exam is nonfocal.  EXTREMITIES: Without edema.  SKIN:  Warm and dry.   LABORATORY DATA:  ECG shows sinus bradycardia with LVH and ST-T wave changes  consistent with inferolateral ischemia.   Coags are normal.  Glucose 180, BUN 31, creatinine 1.6, sodium 141,  potassium 5, chloride 104, CO2 26.  CBC is normal.   Chest x-ray shows prior bypass changes, no active disease.   IMPRESSION:  1.  Increasing anginal symptoms in patient with known coronary disease.  2.  Status post coronary artery bypass grafting.  3.  Status post stenting of vein graft to the diagonal.  4.  Status  post left renal artery stent.  5.  Hypertension.  6.  Diabetes mellitus.  7.  Mild renal insufficiency.  8.  Human immunodeficiency virus positivity.   PLAN:  Proceed with cardiac catheterization and potential intervention if  indicated.           ______________________________  [name] Proctor, M.D.     PMJ/MEDQ  D:  03/02/2006  T:  03/03/2006  Job:  [id number]   cc:   [name] Proctor, M.D.   [name] Proctor. [name] Proctor, M.D.  [address]  [place]  [place] [address]

## 2011-05-12 NOTE — Op Note (Signed)
NAME:  CARL, BUTNER NO.:  [id number]   MEDICAL RECORD NO.:  [id number]          PATIENT TYPE:  OIB   LOCATION:                               FACILITY:  [facility]   PHYSICIAN:  Di Kindle. Edilia Bo, M.D.DATE OF BIRTH:  15-Aug-1935   DATE OF PROCEDURE:  02/06/2005  DATE OF DISCHARGE:                                 OPERATIVE REPORT   PREOPERATIVE DIAGNOSIS:  Left renal artery stenosis and right renal artery  aneurysm   POSTOPERATIVE DIAGNOSIS:  Left renal artery stenosis and right renal artery  aneurysm.   PROCEDURE:  1.  Selective left renal arteriogram.  2.  PTA and stent of left renal artery.  3.  Selective right renal arteriogram.   SURGEON:  Di Kindle. Edilia Bo, M.D.   INDICATIONS:  This is a 76 year old gentleman who underwent a cardiac  catheterization in September and had an abdominal aortogram at that time. At  that time he was noted that an 80% left renal artery stenosis. There was  also a small aneurysm noted. Subsequent CT scan confirmed a tight left renal  artery stenosis; and, in addition, a 17-mm right renal artery aneurysm was  identified in addition to a 2.7-cm left adrenal mass. He had had increasing  problems with control his blood pressure and left renal angioplasty was  recommended in order to help control his blood pressure. The procedure and  potential complications were discussed with the patient. All of his  questions were answered; and he was agreeable to proceed.   TECHNIQUE:  The patient was taken to the PV lab at, [facility] and sedated  with a milligram of Versed and 50 mcg of fentanyl. Both groins were prepped  and draped in usual sterile fashion. After the skin was anesthetized with 1%  lidocaine, the right common femoral artery was cannulated and a guidewire  introduced into the infrarenal aorta under fluoroscopic control.  A 6-French  sheath was passed over the wire and the dilator was then removed. A short  right  catheter was then positioned into the left renal artery origin and  selective left renal arteriogram was obtained. This did demonstrate an  approximately 80% left renal artery stenosis.   Next, the right short right catheter, which was exchanged for a renal,  double-curve, 6-French guide catheter which was positioned into the proximal  left renal artery. A 0.018 wire was advanced past the stenosis. A premounted  stent ([id number]) was positioned across the stenosis; and then the guide  catheter was retracted into the aorta. The balloon and stent were inflated  to 10 atmospheres for 30 seconds. The balloon was then deflated and  subsequent arteriogram demonstrated some minimal stenosis within the stent.  I elected to go to a 6-mm balloon.  A 6 x 2 balloon was then positioned,  such that the distal aspect of balloon was at the end of the stent distally;  and this was inflated to 10 atmospheres for 30 seconds. Completion  arteriogram, at this time, showed an excellent result with no residual  stenosis.   Next the guide catheter was  retracted back into the aorta and then  positioned into the proximal left renal artery. Selective left renal  arteriogram was obtained. This demonstrated a fusiform right renal artery  aneurysm at the level of the primary bifurcation. Of note, there is an  inferior branch which is noted to have moderate stenosis. Based on the angle  at which this came off the main renal artery and the adjacent aneurysm, this  did not appear to be a good stenosis to be addressed from an endovascular  standpoint.   CONCLUSIONS:  1.  An 80% left renal artery stenosis which was successfully angioplastied      and stented as described above.  2.  Small right renal artery aneurysm with a moderate stenosis of an      inferior branch on the right.      CSD/MEDQ  D:  02/06/2005  T:  02/06/2005  Job:  [id number]   cc:   Peter M. [place]il[place], M.D.  [address]  [place],  [place] [address]  Fax: [phone]

## 2011-05-12 NOTE — H&P (Signed)
NAME:  [name] Proctor, [name] Proctor                      ACCOUNT NO.:  [id number]   MEDICAL RECORD NO.:  [id number]                   PATIENT TYPE:  OIB   LOCATION:                                       FACILITY:  [facility]   PHYSICIAN:  Peter M. Swaziland, M.D.               DATE OF BIRTH:  09/12/1935   DATE OF ADMISSION:  09/08/2004  DATE OF DISCHARGE:                                HISTORY & PHYSICAL   HISTORY OF PRESENT ILLNESS:  [name] Proctor is a 76 year old white male with a  history of coronary artery disease, status post coronary artery bypass  surgery in 1997, who presents with new onset of angina.  Approximately one  and a half weeks ago, he experienced severe dizziness while working at his  computer.  This was associated with nausea, vomiting, and significant  hypertension.  He went to the emergency room where his blood pressure was  treated, and he was discharged to home.  This past Saturday while shopping,  he developed sudden onset of severe substernal chest pain radiating to his  left arm.  He states he could not get a deep breath.  He called an  ambulance, was taken to the emergency room, where his pain was relieved  after three sublingual nitroglycerin.  He ruled out for myocardial  infarction.  He refused admission to the hospital and was discharged home.  On followup today, he has had continued milder episodes of chest pain with  exertion, relieved with rest.  Of note, the patient had extensive cardiac  evaluation, in March 2005, after it was noted that he had frequent  ventricular ectopy.  He had an echocardiogram which showed mild LVH with  inferior wall hypokinesia and overall ejection fraction of 55%.  He had mild  to moderate mitral insufficiency and mild left atrial enlargement.  He had a  stress Cardiolite study in which he was able to walk for seven minutes.  He  did have multi PVCs noted.  He had dyspnea but no chest pain.  His  Cardiolite images demonstrate fixed attenuation of  the basal lateral wall  but no evidence of ischemia and ejection fraction of 52%.  Since his PVCs at  that time were asymptomatic, we recommended no further therapy.   PAST MEDICAL HISTORY:  1.  ASCAD status post CABG x 4 on August 01, 1996.  At that time he had an      LIMA graft placed to the LAD, a vein graft to the diagonal, a vein graft      to the intermediate vessel, and a vein graft to the PDA, by Dr.      Tyrone Sage.  2.  Diabetes mellitus, type 2.  3.  Hypercholesterolemia.  4.  PVCs.  5.  Hypertension.  6.  History of HIV positivity on chronic antiviral therapy.   ALLERGIES:  1.  SELDANE.  2.  DARVOCET.   CURRENT  MEDICATIONS:  1.  Fluvastatin 80 mg per day.  2.  Lisinopril 5 mg per day.  3.  Metformin 500 mg per day.  4.  Lopinavir/Ritonivir 133.3/33.3 mg three tablets b.i.d.  5.  Abacavir 600 mg per day.  6.  Lamivudine 300 mg per day.   SOCIAL HISTORY:  He is a retired Web designer.  He denies alcohol or  tobacco use.  He is divorced.  He has one daughter.   FAMILY HISTORY:  Father died at age 64 with hypertension.  Mother died with  renal failure at age 38 but had a myocardial infarction at age 16.  One  brother is status post CABG and required redo surgery.  One sister is status  post carotid endarterectomy.   REVIEW OF SYSTEMS:  The patient reports that his viral titers have been well  controlled on antiviral therapy.  He has had no adenopathy, no recent  infections.  He has had no bleeding difficulty, history of TIA.  He has no  history of orthopnea, PND, or edema.  No recent bowel or bladder complaints.  Other review of systems are negative.   PHYSICAL EXAMINATION:  GENERAL:  The patient is a well developed white male  in no apparent distress.  VITAL SIGNS:  Blood pressure is 170/100, pulse 88, weight is 185,  respirations are 20.  HEENT:  Pupils equal, round and reactive to light and accommodation.  Extraocular movements are full.  Oropharynx is  clear.  NECK:  Supple without JVD, adenopathy, thyromegaly, or bruits.  LUNGS:  Clear to auscultation percussion.  CARDIAC:  Reveals a regular rate and rhythm without rubs or gallops.  There  is a soft 1 to 2 over 6 systolic murmur heard at the apex.  There is no S3  or rub.  ABDOMEN:  Soft and nontender.  There is no hepatosplenomegaly, masses, or  bruits.  Femoral and pedal pulses are 2+ and symmetric.  EXTREMITIES:  He has no edema.  NEUROLOGIC:  Nonfocal.  SKIN:  Warm and dry.   LABORATORY DATA:  ECG shows a normal sinus rhythm with LVH.  There are ST-T  wave changes consistent with inferolateral ischemia that are increased from  March 2005.   IMPRESSION:  1.  New onset of angina.  2.  Status post coronary artery bypass graft in 1997.  3.  Diabetes mellitus, type 2.  4.  Hypertension, poorly controlled.  5.  Hyperlipidemia.  6.  Premature ventricular contractions.  7.  Human immunodeficiency virus positivity.   PLAN:  The patient was added on beta-blocker therapy.  We will admit for  cardiac catheterization and graft angiography.  Also consider renal  angiography given his significant increase in blood pressure.  Further  therapy pending these results.                                                Peter M. Swaziland, M.D.    PMJ/MEDQ  D:  09/06/2004  T:  09/06/2004  Job:  [id number]   cc:   [name] Proctor, Dr.  [facility]

## 2011-05-12 NOTE — Discharge Summary (Signed)
[facility]  Patient:    [name] Proctor, [name] Proctor [facility] Visit Number: [id number] MRN: [id number]          Service Type: MED Location: [phone] Attending Physician:  Madaline Guthrie Dictated by:   Brett Canales, MS4 Admit Date:  10/05/2001 Discharge Date: 10/09/2001   CC:         Madaline Guthrie, M.D.             Dr. Andrey Campanile, [facility], Infectious disease                           Discharge Summary  CONTINUITY DOCTOR:  Dr. Andrey Campanile, [place], [phone]  CONSULTING PHYSICIANS:  None.  DISCHARGE DIAGNOSES: 1. Cellulitis right leg - recurrent. 2. Human immunodeficiency virus diagnosed in 1989, CD4 320 on October    2002. 3. Hypertension. 4. Coronary artery disease, status post coronary artery bypass graft x 4    in 1997. 5. History of Pneumocystis pneumonia.  DISCHARGE MEDICATIONS:  1. Keflex 500 mg p.o. q.i.d.  2. Aspirin 81 mg p.o. q.d.  3. Clotrimazole 1% cream apply to athletes feet b.i.d. between toes.  4. Atenolol 50 mg q.d.  5. Diltiazem 120 mg p.o. q.d.  6. Pravachol 20 mg p.o. q.d.  7. Ziagen 300 mg b.i.d.  8. Saquinavir 200 mg six tablets b.i.d.  9. Viracept 250 mg five tablets b.i.d. 10. Percocet 5/325 one to two q.4-8h. p.r.n. pain #40.  DISPOSITION:  The patient was stable and discharged to home with instructions to keep activity to a minimum, try to avoid prolonged standing and sitting, and keep foot elevated.  American Heart Diet.  He is to apply Clotrimazole per instructions to his athletes foot and was instructed to return if any increased pain or swelling occurs in his right leg.  He is scheduled for an outpatient clinic visit on Monday, October 21, at 2 p.m. with Brett Canales, MS4.  At follow-up his CBC and BMP will be checked and his compliance with his Keflex as well as the status of his cellulitis should be inspected as well.  PROCEDURE:  None.  HISTORY OF PRESENT ILLNESS:  The patient is a 76 year old white male with  a history of HIV diagnosed in 1989, previous right leg cellulitis, possibly due to saphenous vein harvest, presents to the ED with a two-day history of increased pain, erythema, and swelling of his right leg.  He also states that he fell asleep Thursday at 2 a.m. and has no definite memories until the following Friday at 3 p.m.  He has vague memories of having diarrhea during that period, but is unsure.  When he finally awoke on Friday afternoon, he had fecal and urine incontinence which was witnessed by his relative.  He did complain of a fever and nightsweats on Friday night.  On physical examination his right leg was noted to be slightly swollen compared to the left, very erythematous up to knees.  No blisters, no ingrown toenails, pulses intact, no calf tenderness.  The rest of his physical examination was normal.  LABORATORY DATA:  WBC 9.4 with AMC 6.8, hemoglobin 12.9, hematocrit 36, platelets 146.  PT 13.7, INR 1.1, PTT 33.  Sodium 133, potassium 4.0, chloride 99, bicarb 22, BUN 35, creatinine 1.2, glucose 115, pH 7.45.  Dopplers were negative for DVT.  HOSPITAL COURSE:  #1 - Cellulitis.  The patient improved steadily, but slowly throughout hospital course on Ancef 1 gram q.6h.  IV.  His daily CBCs were monitored and his white blood cell count decreased from 9.4 to 8.3 to 7.6.  His blood cultures were negative x 5.  On the day of discharge, the patient noted decreased pain and swelling as well as decreased erythema and heat on physical examination.  The level of infection near his knee did not change much.  This was consistent with his last course of cellulitis in the same leg.  The patient was discharged home on Keflex 500 mg p.o. q.i.d. with a follow-up appointment on Monday, October 21, in the [facility].  The patient was instructed to call the hospital if the pain begins to get worse or he begins to spike fevers. He was also written a note for work, backdated to October 10.  #2 -  Hypertension.  The patient has been stable on home regimen of antihypertensive medicines.  See above.  #3 - HIV.  The patient is stable on home regimen.  See above.  Additional labs were drawn and he was noted to have a CD4 count of 320 with a T-helper of 13%. He is to continue following up with Dr. Andrey Campanile in the [facility] for this problem. The patient is not on any prophylactic antibiotics.  #4 - Coronary artery disease.  The patient had a fasting lipid panel while he was here.  His HDL was found to be slightly low at 19, but his LDL was in accessible range of 78.  His triglycerides were mildly elevated.  The patient has continued taking his Pravachol at home and continue a low fat diet.  No chest pain or shortness of breath during his hospital stay.  No history of exertional angina recently.  DISCHARGE LABORATORY DATA:  Sodium 135, potassium 4.0, chloride 102, bicarb 26, BUN 15, creatinine 0.9, glucose 100.  Hemoglobin 12.2, hematocrit 34.2, white blood cell count 8.2, and platelets 209. Dictated by:   Brett Canales, MS4 Attending Physician:  Madaline Guthrie DD:  10/09/01 TD:  10/10/01 Job: [id number]

## 2011-05-12 NOTE — Discharge Summary (Signed)
NAME:  MACKEY, VARRICCHIO NO.:  [id number]   MEDICAL RECORD NO.:  [id number]          PATIENT TYPE:  INP   LOCATION:  6531                         FACILITY:  [facility]   PHYSICIAN:  Peter M. Swaziland, M.D.  DATE OF BIRTH:  Apr 30, 1935   DATE OF ADMISSION:  03/03/2006  DATE OF DISCHARGE:  03/07/2006                                 DISCHARGE SUMMARY   HISTORY OF PRESENT ILLNESS:  Mr. Davids is a 76 year old white male with  history of coronary artery disease. He is status post coronary bypass  surgery x4 in 1997. In September 2005, he had stenting of saphenous vein  graft to diagonal branch using a 3.0 x 32 mm Taxus stent. Over the past  three months, he has been noticing increasing symptoms of exertional angina.  His symptoms were predominantly of left arm pain but this radiates into his  hand and over into his chest. It is relieved with sublingual nitroglycerin.  On the day of admission, the patient had been moving a swing set when he  developed left arm and anterior chest pain and had a near syncopal episode.  He became diaphoretic and nauseated. His left arm pain persisted. He was  brought to the emergency room for further evaluation of unstable angina. The  patient's past history also includes a history of renal artery stenosis  status post left radial artery stenting the summer of 2006. Recent follow-up  CT scan showed continued patency of the stent. He has a history of diabetes  mellitus type 2 that has been treated with diet only. He has a history of  dyslipidemia and hypertension. He also has a history of HIV positivity and  has been on chronic antiviral therapy. For details of his past medical  history, social history, family history and physical exam, please see  admission history and physical.   LABORATORY DATA:  ECG on admission showed normal sinus rhythm. There was T-  wave inversion inferiorly as well as mild ST depression in the lateral  precordial leads. Chest  x-ray showed no active disease. Heart size was  mildly increased. A pH of 7.34, pCO2 of 41.5, bicarb was 22.5. White count  7000, hemoglobin 11.9, hematocrit 33.6, platelet 189,000. Coags were normal.  Sodium 138, potassium 4.3, chloride 108, CO2 24, glucose was 220, BUN was  22, creatinine 1.5, calcium 8.9, albumin 3.4. LFTs were normal. Glycosylate  hemoglobin was 8.5%. CK was 83 with 2.7 MB and troponin 0.04. Follow-up  repeat CPK, MB and troponin were negative. Cholesterol was 135 and  triglycerides of 293, HDL 24 and LDL of 52.   HOSPITAL COURSE:  The patient was admitted to telemetry monitoring. He was  placed on IV heparin and IV nitroglycerin. As long as he was on IV  nitroglycerin, he had no further chest pain. He did rule out for acute for  myocardial infarction. He was placed on sliding scale insulin. Given his  mild renal insufficiency and the fact that he needed to undergo cardiac  catheterization, we recommended starting him on Avandia 4 milligrams per day  for his diabetes.  During his hospital stay, his blood sugars were initially  quite elevated in the 300 range and then declined during his hospital stay  to less than to 200 but were still elevated.   On March 05, 2006, he underwent cardiac catheterization. This demonstrated  occlusion of the proximal LAD. Large intermediate vessels was also occluded  proximally. Left circumflex coronary was relatively small vessel that  continued in the AV groove and really did not give rise any significant  marginal vessels. There was a 90% stenosis in the distal left circumflex.  Right coronary artery was also occluded proximally. Saphenous vein graft to  the PDA had a 95% stenosis in the mid-vein graft. This was a new lesion. The  vein graft to intermediate vessel was widely patent. The vein graft to the  diagonal demonstrated a 40% tandem lesions in the proximal vein graft and  there was a focal eccentric plaque in the body of the  vein graft within the  prior stented segment. The LIMA graft to the LAD was widely patent. Left  ventricular function was normal with an ejection fraction estimated at 55%.  On the basis of these findings, we recommended intervention of the right  coronary and diagonal grafts. However, given his renal insufficiency, this  procedure was staged. The patient was well-hydrated and also given Mucomyst  therapy. He was loaded with oral Plavix. On March 06, 2006, he underwent  stenting of both of these lesions. The vein graft to the PDA was stented  using a 3.0 x 60 mm Taxus stent with an excellent angiographic result and 0%  residual stenosis and TIMI III flow. We then stented the focal lesion in the  diagonal vein graft and was within this prior stented segment. This was  covered with a 3.0 x 12 mm Taxus stent, again with an excellent result. The  patient tolerated the procedure well. He had no subsequent chest pain. His  ECG on follow-up showed only minor T-wave flattening laterally. Follow up  cardiac enzymes postprocedure showed a troponin 0.05, a CK of 64 with 1.9  MB. He did have a mild increase in his creatinine to 1.8. His BUN was 24.  His electrolytes were normal. Glucose 164. His hemoglobin was 11.4 at the  time of discharge. The patient had no groin complications. He had excellent  urine output and was felt to be stable for discharge. He was discharged on  March 07, 2006.   DISCHARGE DIAGNOSES:  1.  Unstable angina pectoris.  2.  Atherosclerotic coronary disease status post-coronary artery bypass      graft in 1997. Status post stenting of the vein graft to the diagonal in      September 2005, now status post successful stenting of the saphenous      vein graft to the posterior descending artery and repeat stenting of the      saphenous vein graft to the diagonal.  3.  Diabetes mellitus type 2, poorly controlled on diet.  4.  Dyslipidemia.  5.  Hypertension. 6.  History of renal  artery stenosis status post stenting.  7.  History of human immunodeficiency virus positivity.   DISCHARGE MEDICATIONS:  1.  Coated aspirin 325 milligrams per day.  2.  Plavix 75 milligrams per day.  3.  Lescol 80 milligrams per day.  4.  Zetia 10 milligrams per day.  5.  Nifedipine 30 milligrams per day.  6.  Metoprolol 50 milligrams twice a day.  7.  Lisinopril 20 milligrams  daily.  8.  Avandia 4 milligrams daily for his diabetes.  9.  Omega-3 fish oil tablet 1200 milligrams 3 tablets daily.  10. The patient is to continue his antiviral therapy including      Lopinavir/Ritonavir 200/50 mg twice a day 2 tablets twice a day.  11. Abacavir 600 milligrams at bedtime.  12. Lamivudine 300 milligrams at bed time.   The patient is to follow up with Dr. Swaziland in one week. We will check a  BMET on follow-up and follow up on his renal function. If his renal function  returns to baseline, he may be a candidate for metformin therapy for his  diabetes  but in the meantime, we will manage him with Avandia. Given the fact that he  has now had stenting of two very high risk lesions in two separate vein  grafts that are over 36 years old and using drug coated stents, it was felt  the patient should remain on Plavix indefinitely.           ______________________________  Peter M. Swaziland, M.D.     PMJ/MEDQ  D:  03/07/2006  T:  03/08/2006  Job:  [id number]   cc:   Haywood Pao, M.D.  [employer].   Di Kindle. Edilia Bo, M.D.  [address]  [place]  [place] [address]

## 2011-05-12 NOTE — Op Note (Signed)
NAME:  [name] Proctor, [name] Proctor                      ACCOUNT NO.:  [id number]   MEDICAL RECORD NO.:  [id number]                   PATIENT TYPE:  OIB   LOCATION:  6526                                 FACILITY:  [facility]   PHYSICIAN:  Peter M. Swaziland, M.D.               DATE OF BIRTH:  07-04-1935   DATE OF PROCEDURE:  DATE OF DISCHARGE:                                 OPERATIVE REPORT   INDICATION FOR PROCEDURE:  A 76 year old white male with a history of  coronary artery disease status post coronary artery bypass surgery in 1997,  now presents with unstable angina.   PROCEDURES:  Left heart catheterization, coronary and left ventricular  angiography, graft angiography including internal mammary artery graft,  abdominal aortography, and percutaneous stenting of the saphenous vein graft  to the diagonal branch.   ACCESS:  Via the right femoral artery using a standard Seldinger technique.   EQUIPMENT:  A 6-French, 4-cm, right and left Judkins catheter, 6-French  pigtail catheter, 6-French arterial sheath, 6-French left Amplatz 2 guide,  0.014 high-torque floppy wire, 2.5 x 15-mm Maverick balloon, and a 3.0 x 32-  mm TAXUS drug-alluding stent.   MEDICATIONS:  Heparin, a total of 10,000 IV, with subsequent ACT of 273,  nitroglycerin 200 mcg intracoronary x1.  Contrast:  350 cc of Omnipaque.   HEMODYNAMIC DATA:  The aortic pressure is 140/81 with a mean of 106.  Left  ventricular pressure is 143 with an EDP of 26 mmHg.   ANGIOGRAPHIC DATA:  The left coronary arises and distributes normally.  The  left main coronary artery has mild diffuse disease without obstructive  lesions.   The left anterior descending artery is occluded after the first septal  perforator.  The first diagonal is occluded.   There is a large intermediate which is occluded proximally.   The left circumflex coronary is a small branch which basically terminates in  the A-V groove.  There is a 90% stenosis distally.   The  right coronary artery is occluded proximally.  There are left to right  collaterals to the posterolateral branches of the right coronary artery.   The saphenous vein graft to the PDA is widely patent with good runoff.  There is a 30% lesion in the mid-body of the vein graft.   The saphenous vein graft to the intermediate branch is widely patent.  It  has 20% narrowing in the mid-graft.   The saphenous vein graft to the diagonal has a 40% focal stenosis  proximally.  There are then tandem 90% lesions in the mid-to-distal vein  graft.  This then inserts in a relatively small diagonal branch.   The LIMA graft is a very large and is widely patent supplying the LAD.   The left ventriculography in the RAO view demonstrates normal left  ventricular size.  There is inferior wall hypokinesia with overall mild left  ventricular dysfunction.  Ejection fraction is estimated  at 45-50%.  There  is no significant mitral insufficiency noted.   ABDOMINAL AORTOGRAPHY:  This demonstrates diffuse ectasia of the aorta and  iliac vessels.  There is a small saccular infrarenal aneurysm.  The left  renal artery has a focal 80% stenosis.  The right renal artery is without  significant obstructive disease.  Both iliacs are widely patent.   We proceeded at this point with percutaneous intervention of the vein graft  to the diagonal.  After initial guide shots were obtained, and the patient  was anticoagulated, we crossed the lesion without difficulty using the wire.  We predilated the lesion using a 2.5 x 15-mm Maverick balloon to 6  atmospheres.  We then attempted to stent this lesion using a 3.0 x 28-mm  CYPHER stent.  However, in attempting to deploy this stent, the balloon  ruptured, and the sent was undeployed.  We were able to withdraw this  without mishap.  We then attempted to dilate the distal lesion which  appeared to be the difficult area but were unable to cross with a 2.75 x 9-  mm  Monorail.   We went back to the 2.5 x 15-mm Maverick and crossed the  lesion and dilated up to 14 atmospheres.  We were then able to deploy a 3.0  x 32-mm TAXUS Express2 stent across the entire treated area deploying at 9  atmospheres and  post-dilating to 14 atmospheres.  This yielded an excellent  angiographic result with 0% residual stenosis and TIMI-III flow.  The  patient tolerated the procedure well without complications.   FINAL INTERPRETATION:  1.  Severe three-vessel obstructive atherosclerotic coronary artery disease.  2.  Patent saphenous vein graft to the posterior descending artery.  3.  Patent saphenous vein graft to the intermediate vessel.  4.  Patent left internal mammary artery graft to the left anterior      descending.  5.  Patent but severely stenotic saphenous vein graft to the diagonal.  6.  Mild left ventricular dysfunction.  7.  Small saccular abdominal aortic aneurysm.  8.  Moderate-to-severe left renal artery stenosis.  9.  Successful stenting of the saphenous vein graft to the diagonal branch.                                               Peter M. Swaziland, M.D.    PMJ/MEDQ  D:  09/08/2004  T:  09/08/2004  Job:  [id number]   cc:   Allayne Butcher, M.D.  [facility]

## 2011-05-12 NOTE — Discharge Summary (Signed)
NAME:  [name] Proctor, [name] Proctor NO.:  [id number]   MEDICAL RECORD NO.:  [id number]          PATIENT TYPE:  INP   LOCATION:  4733                         FACILITY:  [facility]   PHYSICIAN:  [name] Proctor, M.D.  DATE OF BIRTH:  [birth year]   DATE OF ADMISSION:  07/02/2006  DATE OF DISCHARGE:  07/05/2006                                 DISCHARGE SUMMARY   HISTORY OF PRESENT ILLNESS:  [name] Proctor is a 76 year old white male with known  history of coronary artery disease, vascular disease, hypertension, chronic  renal insufficiency, diabetes mellitus type 2, and HIV positivity.  He  presented with a one-day history of abrupt onset of weakness, malaise, and a  slow pulse rate of 50.  He subsequently developed some chest pain and  presented to the emergency department.  He was found to be in atrial flutter  with a slow ventricular response of 48 beats per minute.  The patient had  been chronically on metoprolol, but had not taken this for the past 36  hours.  Because of his new onset of arrhythmia he was admitted for further  management.  For details of his past medical history, social history, family  history, and physical examination please see admission history and physical.   LABORATORY DATA:  ECG showed atrial flutter with a slow ventricular response  and nonspecific ST-T wave changes.  Chest x-ray showed mild chronic  interstitial changes with no acute disease.  Additional laboratory data:  pH  7.42, pCO2 of 36, bicarbonate of 23.  White count 6800, hemoglobin 9.9,  hematocrit 29, platelets 203, retic count was 1.2% with RBC retic of 3.12  and absolute retic of 37.4.  Coags were normal.  Sodium 140, potassium 3.8,  chloride 106, CO2 24, glucose of 132, BUN of 44, creatinine of 2.4.  Liver  function studies were all normal with the exception of total bilirubin of  1.6.  Hemoglobin A1c was 6.2%.  CK was 107 with 2.5 MB, troponin 0.05.  BNP  level was 1214.  Serial cardiac enzymes  showed a CK of 89 with 2.4 MB and a  CK of 76 with 1.6 MB.  Troponins went up to 0.09 and 0.11.  TSH was 5.254,  free T4 1.01.  Iron level was 52, TIBC of 357, percent saturation was 15%.  B12 and folate levels were normal.   HOSPITAL COURSE:  Patient was admitted to telemetry bed.  He was  anticoagulated with IV heparin.  His metoprolol continued to be held.  However, he continued to have significant bradycardia with atrial flutter.  It was felt that he would require permanent transvenous pacemaker.  An  echocardiogram was also obtained.  This demonstrated mild concentric LVH  with low normal left ventricular systolic function, an ejection fraction of  50-55%.  There are no regional wall motion abnormalities.  There is moderate  left atrial enlargement and mild right atrial enlargement.  He had mild  mitral tricuspid and aortic insufficiency.  There was evidence of mild  pulmonary hypertension.  The patient was seen by Dr. Lady Proctor and  did  undergo permanent transvenous pacemaker placement on July 03, 2006.  He had  placement of a Medtronic EnRhythm generator serial [id number].  He was able  to be paced out of his atrial flutter during procedure.  Patient tolerated  procedure well.  He remained in sinus rhythm.  On the first postoperative  day he complained of some orthopnea and felt very shaky and chilled and he  had a low grade temperature of 99.3.  He was given Ancef antibiotic coverage  day post procedure and then was started on Augmentin p.o.  He had no further  fever and his chills resolved.  His follow-up chest x-ray did show  interstitial edema with small pleural effusions.  He was diuresed with IV  Lasix with good response and the following day his chest x-ray had cleared  substantially.  His symptoms had resolved.  His pacemaker pocket looked well  and it was felt that he was stable for discharge.  The patient has  chronically been on aspirin and Plavix due to prior  stenting of vein grafts  with drug-eluting stents.  It was felt that he needed to remain on  antiplatelet therapy.  We did start him on Coumadin because of his atrial  flutter, but did not feel that he needed to be heparinized.  It was felt  that just allowing his pro time to gradually increase would be adequate  since he was paced out of his atrial flutter.  In the time of discharge his  INR was 1.2 after only one dose of Coumadin.  His renal function remained  stable.  His BNP level declined to 1036.  We also addressed in his hospital  his anemia.  His low hemoglobin nadired at 9.4.  He was started on iron  therapy.  The patient notes that he had had colonoscopy three years ago, was  completely clear.  He is scheduled for follow-up colonoscopy at the [place] in  September.  We will monitor his hemoglobin closely.  We also discussed the  potential for anti-arrhythmic drug therapy.  However, given his antiviral  therapy he is not a good candidate for amiodarone due to drug interaction.  He is also not a good candidate for Tikosyn or Betapace due to his chronic  renal insufficiency.  He is not a candidate for 1C anti-arrhythmic drugs due  to his coronary disease history.  Therefore, we are hopeful that he will  maintain sinus rhythm with the antitachycardia parameters set on his  pacemaker which will need to be turned on at approximately six weeks post  device implant.  Patient was discharged home in stable condition on July 05, 2006.   DISCHARGE DIAGNOSIS:  1.  Atrial flutter, new onset, with slow ventricular response.  2.  Coronary disease status post coronary artery bypass graft in 1997.      Status post stenting of the vein graft to the posterior descending      artery and the vein graft to the diagonal in March of 2007.  3.  Chronic renal insufficiency.  4.  Diabetes mellitus type 2.  5.  History of renal artery stenosis status post stenting of the left renal     artery.  6.   Hypertension.  7.  Hypercholesterolemia.  8.  History of human immunodeficiency virus positivity.  9.  Anemia, chronic, with evidence of iron deficiency.   DISCHARGE MEDICATIONS:  1.  Coated aspirin 81 mg per day.  2.  Fluvastatin 80 mg per  day.  3.  Plavix 75 mg daily.  4.  Nifedipine 60 mg per day.  5.  Zetia 10 mg daily.  6.  Metoprolol 50 mg twice a day.  7.  Omega tablets 1200 mg three tablets daily.  8.  Coumadin 5 mg daily.  9.  Lasix 40 mg twice a day for three days and then daily.  10. Iron sulfate 325 mg daily.  11. Augmentin 500 mg t.i.d. for five days.  12. He is to continue his antiviral therapy including atazanavir 150 mg two      tablets at bedtime, ritonavir 100 mg daily, abacavir 600 mg daily,      lamivudine 300 mg daily.   Patient will have follow-up pro time in Dr. Elvis Coil office on Monday, July  16.  He will follow up with Dr. Swaziland in approximately one week for  pacemaker site check.  Will need complete pacemaker interrogation in  approximately six weeks.  He is also given routine post pacemaker  instructions.   DISCHARGE STATUS:  Improved.           ______________________________  [name] Proctor, M.D.     PMJ/MEDQ  D:  07/05/2006  T:  07/05/2006  Job:  [id number]

## 2011-05-12 NOTE — Discharge Summary (Signed)
NAME:  [name] Proctor, [name] Proctor                      ACCOUNT NO.:  [id number]   MEDICAL RECORD NO.:  [id number]                   PATIENT TYPE:  OIB   LOCATION:  6526                                 FACILITY:  [facility]   PHYSICIAN:  Peter M. Swaziland, M.D.               DATE OF BIRTH:  [birth year]   DATE OF ADMISSION:  09/08/2004  DATE OF DISCHARGE:  09/09/2004                                 DISCHARGE SUMMARY   HISTORY OF PRESENT ILLNESS:  [name] Proctor is a 76 year old white male status  post CABG in 1997 who now presents with new-onset of angina.  ECG showed  evidence of new inferolateral ischemia.  For details of his past medical  history, social history, family history and physical exam, please see  admission history and physical.   LABORATORY DATA:  His chest x-ray showed no active disease.  His cbc showed  a white count of 5300, hemoglobin 13.5, hematocrit 39.4, coags were normal,  platelets 218,000, sodium 135, potassium 4.3, chloride 102, CO2 24, BUN 20,  creatinine 1.1, glucose 121.   HOSPITAL COURSE:  The patient was admitted and underwent cardiac  catheterization  This demonstrated occlusion of the native coronary vessels.  He had patent saphenous vein graft to the PDA.  He had a patent saphenous  vein graft to the intermediate vessel.  The LIMA graft to the LAD was  patent.  The vein graft to the diagonal was patent, but had tandem 90%  stenoses in the body of the vein graft.  Left ventricular angiography  demonstrated inferior wall hypokinesia with ejection fraction of  approximately 45%.  The patient underwent successful stenting of the  saphenous vein graft to the diagonal using a 3.0 x 32 millimeter Taxus drug-  eluting stent.  This yielded an excellent angiographic result with 0%  residual stenosis.  The patient was treated with aspirin and Plavix.  He had  no recurrent chest pain.  He remained hemodynamically stable.  He had no  groin complications.  His followup ECG showed  significant improvement in  these inferolateral ST-T wave changes.  He was discharged home in stable  condition on September 09, 2004.   DISCHARGE DIAGNOSES:  1.  Unstable angina pectoris.  2.  Coronary artery disease status post coronary artery bypass surgery 1997.  3.  Diabetes mellitus type 2.  4.  Hyperlipidemia.  5.  Hypertension.  6.  History of HIV positivity.   ADDENDUM:  We also, at the time of his cardiac catheterization, did an  abdominal aortogram.  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  There was an 80% stenosis in the left renal artery.  It  was felt that this would be followed clinically.  He if he had increasing  difficulty with blood pressure control, could consider stenting of the left  renal artery.   DISCHARGE MEDICATIONS:  1.  Aspirin 325 mg daily.  2.  Plavix 75 mg daily.  3.  Toprol XL 50 mg daily.  4.  Lisinopril 5 mg daily.  5.  Fluvastatin 80 mg daily.  6.  Glucophage 500 mg daily to be resumed on Sunday.  7.  He will remain on his prior antiviral therapy which includes abacavir      600 mg daily, Lamivudine 300 mg daily, Lopinavir and Ritonivir      13 3.3/33.3 mg 3 tablets b.i.d.   The patient will follow up with Dr. Swaziland in one week.  Avoid heavy lifting  or straining for five days.  Discharge status is improved.       PMJ/MEDQ  D:  09/09/2004  T:  09/09/2004  Job:  [id number]   cc:   Haywood Pao, MD  [facility]

## 2011-05-12 NOTE — Cardiovascular Report (Signed)
NAME:  Proctor Proctor NO.:  Proctor   MEDICAL RECORD NO.:  [id number]          PATIENT TYPE:  INP   LOCATION:  2034                         FACILITY:  [facility]   PHYSICIAN:  Proctor Proctor, M.D.  DATE OF BIRTH:  Jul 27, 1935   DATE OF PROCEDURE:  03/05/2006  DATE OF DISCHARGE:                              CARDIAC CATHETERIZATION   INDICATIONS FOR PROCEDURE:  Proctor Proctor is a 76 year old white male who is  status post coronary bypass surgery in 1997. He subsequently had stenting of  the saphenous vein graft to the first diagonal in September of 2005. He now  presents with recurrent unstable angina.   PROCEDURES:  Left heart catheterization, coronary and left ventricular  angiography, saphenous vein graft angiography x3 and LIMA graft angiography.   ACCESS:  Via the right femoral artery using standard Seldinger technique.   EQUIPMENT:  6-French 4 cm right and left Judkins catheter, 6-French pigtail  catheter, 6-French left IMA catheter, 6-French LCB catheter.   MEDICATIONS:  Local anesthesia 1% Xylocaine.   CONTRAST:  165 cc of Omnipaque.   HEMODYNAMIC DATA:  Aortic pressure was 138/67 with a mean of 95. Left  ventricular pressure was 135 with EDP of 19 mmHg.   ANGIOGRAPHIC DATA:  Left coronary arises and distributes normally. Left main  coronary has minor irregularities less than 10%.   The left anterior descending artery is occluded proximally. The first  diagonal is occluded proximally.   There is a large intermediate vessel which is occluded proximally.   Left circumflex coronary is a small vessel which extends in the AV groove.  There are no other significant marginal vessels. It has a 90% stenosis  distally.   The right coronary arises normally. It is occluded proximally. There are  left to right and right-to-left collaterals to the distal and mid-right  coronary artery.   The saphenous vein graft to the PDA is patent but has a focal 95% stenosis  in the mid-body of the vein graft. There is good runoff.   The saphenous vein graft to the intermediate branch is widely patent. It has  somewhat diffuse 20% narrowing in the mid-graft.   The saphenous vein graft to the diagonal branch is patent. There is a 40%  stenosis proximally followed in a tandem lesion of 40-50% stenosis in the  proximal vein graft. There is a long stent located in the mid-vein graft and  within the stented segment there is a focal eccentric 70% stenosis.   The LIMA graft is a large graft and is widely patent with good runoff in the  LAD.   Left ventricular angiography was performed in the RAO view. This  demonstrates minimal inferior wall hypokinesia with overall low normal left  ventricular systolic function with ejection fraction estimated at 50-55%.  There is no significant mitral insufficiency.   FINAL INTERPRETATION:  1.  Severe three-vessel obstructive atherosclerotic coronary disease.  2.  Patent left internal mammary artery graft to the left anterior      descending artery.  3.  Patent saphenous vein graft to the intermediate vessel.  4.  Patent but severely stenotic saphenous vein graft to the posterior      descending artery.  5.  Patent but moderate stenosis in the saphenous vein graft to the diagonal      including one focal segment of in-stent restenosis.  6.  Low normal left ventricular systolic function.   PLAN:  Would consider stenting of the vein graft to the PDA and repeat  stenting of the vein graft to the diagonal.           ______________________________  Proctor Proctor, M.D.     PMJ/MEDQ  D:  03/05/2006  T:  03/06/2006  Job:  [id number]   cc:   Proctor Proctor, M.D.  [place], [place].   [name] Proctor. [name] Proctor, M.D.  [address]  [place]  [place] [address]

## 2011-05-12 NOTE — Cardiovascular Report (Signed)
NAME:  [name] Proctor, [name] Proctor NO.:  [id number]   MEDICAL RECORD NO.:  [id number]          PATIENT TYPE:  INP   LOCATION:  6531                         FACILITY:  [facility]   PHYSICIAN:  Peter M. Swaziland, M.D.  DATE OF BIRTH:  [birth year]   DATE OF PROCEDURE:  03/06/2006  DATE OF DISCHARGE:                              CARDIAC CATHETERIZATION   INDICATIONS FOR PROCEDURE:  A 76 year old white male presented with unstable  angina. He is status post coronary artery bypass surgery in 1997. He had  prior stenting of the saphenous vein graft to the first diagonal branch in  September of 2005. Cardiac catheterization demonstrated critical stenosis in  the mid-saphenous vein graft to the PDA. There was also moderate in-stent  restenosis in the saphenous vein graft to the diagonal in the proximal  portion of the stent. The patient was brought back today for intervention.   ACCESS:  Via the right femoral artery using standard Seldinger technique.   PROCEDURE:  Intracoronary stenting of saphenous vein graft to the PDA and  intracoronary stenting of the saphenous vein graft to the diagonal.   EQUIPMENT USED:  6-French arterial sheath, 6-French FR-4 guide, 0.014 high-  torque floppy wire, 3.0 x 15 mm Maverick balloon and a 3.0 x 16 mm Taxus  stent for the vein graft to the PDA. For the vein graft to the diagonal, we  used a 6-French AL-1 guide with side holes, a 0.014 high-torque floppy wire  and a 3.0 x 12 mm Taxus stent.   CONTRAST:  150 cc of Omnipaque. The patient remained hemodynamically stable  throughout the procedure.   MEDICATIONS:  The patient was given Angiomax bolus 0.75 mg/kg followed by  continuous infusion of 1.75 mg/kg per hour.   The ACT after Angiomax bolus was 333. Next, we proceeded with stenting of  the saphenous vein graft to the PDA. We had good guide support with the 6-  [place] JR-4 guide. We initially intended to use a 3.0 mm spider wire filter.  We were able  to cross the lesion without difficulty using the high-torque  floppy wire but when we tried to pass the filter wire device, it would not  cross the lesion despite excellent a wire position distally and excellent  guide support. We then withdrew the spider wire, went ahead to predilate the  lesion with a 3.0 x 15 mm Maverick balloon up to 3 atmospheres. We were then  able to primarily stent the lesion using a 3.0 x 16 mm Taxus stent deploying  this at 9 atmospheres and postdilated to 12 atmospheres. This yielded  excellent angiographic result with 0% residual stenosis and TIMI grade III  flow. It was noted even prior to intervention that the distal PDA appeared  to have abrupt cutoff consistent with distal embolization. This was  unchanged postprocedure.   We next proceeded with stenting of the vein graft to the diagonal. It was  noted there was modest disease in the proximal vein graft up to 30-40% and  then a focal eccentric plaque in the proximal portion of the stent in  the  mid-body of the vein graft. We used a left Amplatz I guide but despite this  had very poor guide support. We were able to cross the lesion with the wire  and subsequently primarily stented the lesion with a 3.0 x 12 mm Taxus stent  deploying at 9 atmospheres and postdilating to 12 atmospheres with excellent  angiographic result and 0% residual stenosis. Given the difficulty with  guide support, it was suggested that if any further intervention needed to  be done to the diagonal branch, that we would consider using perhaps a left  Amplatz II guide or alternative guide with better support.   FINAL INTERPRETATION:  1.  Successful intracoronary stenting of the saphenous vein graft to the      posterior descending artery.  2.  Successful intracoronary stenting of the saphenous vein graft to the      diagonal.           ______________________________  Peter M. Swaziland, M.D.     PMJ/MEDQ  D:  03/06/2006  T:   03/06/2006  Job:  [id number]   cc:   Haywood Pao, M.D.  [facility]

## 2011-05-12 NOTE — H&P (Signed)
NAME:  [name] Proctor, CORNIA NO.:  [id number]   MEDICAL RECORD NO.:  [id number]          PATIENT TYPE:  INP   LOCATION:  4733                         FACILITY:  [facility]   PHYSICIAN:  Peter M. Swaziland, M.D.  DATE OF BIRTH:  09/14/1935   DATE OF ADMISSION:  07/02/2006  DATE OF DISCHARGE:                                HISTORY & PHYSICAL   HISTORY OF PRESENT ILLNESS:  Mr. Schmieder is a very pleasant, 76 year old, white  male with history of coronary disease, status post CABG and prior coronary  stents.  He has a history of hypertension, chronic renal insufficiency and  HIV positivity.  He is also diabetic.  Yesterday afternoon, he had abrupt  onset of weakness and malaise and noticed that his pulse had dropped to 50  where normally his pulse rate is 78-80.  He continued to feel bad today and  notices pulse rate drop into the 40s.  This was associated with symptoms of  chest pain.  He came to the emergency department and was found to be in  atrial flutter, new onset with a rate of 48 beats per minute.  The patient  had been on metoprolol 50 mg 1-2 tablets daily, but states his last dose was  Saturday evening which was over 24 hours ago.   PAST MEDICAL HISTORY:  1.  Coronary artery disease, status post CABG in 1997, by Dr. Tyrone Sage and a      LIMA graft placed to the LAD, vein graft to the diagonal, vein graft to      the intermediate vessel and vein graft to the PDA.  He subsequently had      stenting of the vein graft to the PDA and the vein graft to diagonal in      March 2007, using Taxus drug-eluting stents.  2.  Chronic renal insufficiency with baseline creatinine 2.2 in June 2007,      followed by Dr. Eliott Nine.  3.  Diabetes mellitus type 2.  4.  History of renal artery stenosis, status post left renal stent.  5.  Hypertension.  6.  History of PVCs  7.  Hypercholesterolemia.  8.  History of HIV positivity.   ALLERGIES:  SELDANE AND DARVOCET.   MEDICATIONS:  1.  Aspirin  325 mg per day.  2.  Plavix 75 mg daily.  3.  Abacavir 600 mg daily.  4.  Lamivudine 300 mg daily/  5.  Zetia 10 mg per day.  6.  Fluvastatin 80 mg per day.  7.  Metoprolol 50 mg 1-2 tablets daily.  8.  Nifedipine 30 mg per day.  9.  Ritonavir 100 mg daily.  10. Lasix 40 mg per day.   SOCIAL HISTORY:  The patient is a retired Web designer.  Denies  tobacco or alcohol use.  He is divorced and has one daughter.   FAMILY HISTORY:  Father died at age 65 with hypertension.  Mother died of  renal failure at age 58.  She had also had prior myocardial infarction.  One  brother had redo coronary bypass surgery.  One sister  has had carotid  endarterectomy.   REVIEW OF SYSTEMS:  He has had no true syncope.  He has had no shortness of  breath.  No increased edema, orthopnea or PND.   PHYSICAL EXAMINATION:  GENERAL:  Well-developed, white male in no apparent  distress.  VITAL SIGNS:  Blood pressure 144/74, pulse is 48 and irregular, saturations  98% on room air.  HEENT:  Normocephalic, atraumatic.  Pupils equal, round, reactive to light  accommodation.  Extraocular movements were full.  Oropharynx is clear.  NECK:  Neck is supple without JVD, adenopathy, thyromegaly or bruits.  LUNGS:  Lungs were clear.  CARDIAC:  Irregular rhythm, bradycardic without murmur, rub or gallop.  ABDOMEN:  Soft, nontender without masses or bruits.  EXTREMITIES:  Without edema.  Pulses are intact.  NEUROLOGIC:  Exam is intact.   LABORATORY DATA:  ECG shows atrial flutter with slow ventricular response,  nonspecific ST and T wave changes.   Sodium is 140, potassium 3.8, chloride 106, CO2 24, BUN 44, creatinine 2.4,  glucose 132.  CPK-MB and troponin negative x1.   IMPRESSION:  1.  Atrial flutter with new-onset and a slow ventricular response with      marked bradycardia, symptomatic.  2.  Chest pain due to #1.  3.  Coronary artery disease, status post prior coronary artery bypass graft      and prior  stenting of two vein grafts.  4.  Renal artery stenosis, status post stenting.  5.  Hypertension.  6.  Diabetes mellitus type 2, diet-controlled.  7.  Human immunodeficiency virus positivity.  8.  Chronic renal insufficiency.   PLAN:  Will admit to telemetry.  Rule out myocardial infarction.  His  metoprolol will be held.  He will be anticoagulated with IV heparin.  Will  obtain thyroid studies and echocardiogram.  The patient will need permanent  transvenous pacemaker for management.           ______________________________  Peter M. Swaziland, M.D.     PMJ/MEDQ  D:  07/02/2006  T:  07/02/2006  Job:  [id number]   cc:   Aram Beecham B. Eliott Nine, M.D.  Fax: [phone]   [facility]

## 2011-05-12 NOTE — H&P (Signed)
NAME:  [name] Proctor, [name] Proctor NO.:  [id number]   MEDICAL RECORD NO.:  [id number]          PATIENT TYPE:  INP   LOCATION:  1825                         FACILITY:  [facility]   PHYSICIAN:  Peter M. Swaziland, M.D.  DATE OF BIRTH:  [birth year]   DATE OF ADMISSION:  12/05/2006  DATE OF DISCHARGE:                              HISTORY & PHYSICAL   HISTORY OF PRESENT ILLNESS:  [name] Proctor is a 76 year old white man who  is admitted to [facility] for the new onset of mild congestive  heart failure.   The patient has a history of cardiac disease which dates back to 33.  At that time, he underwent coronary artery bypass surgery.  He has  subsequently undergone 2 percutaneous interventions, 1 in 2005 and 1 in  March of this year.  At the time of cardiac catheterization this past  March, his left ventricular function was described as demonstrating  minimal inferior hypokinesis with an ejection fraction estimated to be  in the range of 50% to 55%.  He has never suffered congestive heart  failure.  In July of this year, he underwent implantation of a permanent  pacemaker after presenting with atrial flutter with a slow ventricular  rate.   The patient presented to the emergency department this evening with a 1-  day history of orthopnea and pedal edema.  There has been no dyspnea on  exertion or paroxysmal nocturnal dyspnea.  There has been no chest pain,  tightness, heaviness, pressure or squeezing.  He has been taking his  medications as prescribed, and he has been adhering to his usual diet.  In the emergency department, he was given 80 mg of furosemide IV, which  resulted in resolution of his symptoms.  The patient feels well at this  time.   PAST MEDICAL HISTORY:  The patient has a number of other problems  including hypertension, dyslipidemia, diabetes mellitus, chronic renal  insufficiency, chronic anemia, and HIV-positive status.  He is status  post left renal artery  stenting.   MEDICATIONS:  The following represents an incomplete list of his  medications:  Aspirin, clopidogrel, Combivir, Zetia, Lescol, metoprolol,  nifedipine, furosemide, and antiviral therapy.   ALLERGIES:  DARVOCET-N 100 and ULTRAM.   OPERATIONS:  Coronary artery bypass surgery.   FAMILY HISTORY:  His father died at age 16 with hypertension.  His  mother died of renal failure at age 61; she had suffered a myocardial  infarction at age 84.  One brother is status post 2 coronary artery  bypass operations.  One sister is status post carotid endarterectomy.   SOCIAL HISTORY:  The patient is a retired Arts administrator.  He neither  smokes cigarettes nor drinks alcohol.  He is divorced.  He has 1  daughter.   REVIEW OF SYSTEMS:  Review of systems reveals no new problems related to  his head, eyes, ears, nose, mouth, throat, lungs, gastrointestinal  system, genitourinary system, or extremities.  There is no history of  neurologic or psychiatric disorder.  There is no history of fever,  chills or weight loss.  PHYSICAL EXAMINATION:  VITAL SIGNS:  Blood pressure 129/66.  Pulse 63  and irregularly irregular.  Respirations 22.  Temperature 97.1.  GENERAL:  The patient was an older white man in no discomfort.  He was  alert, oriented, appropriate, and responsive.  HEENT:  Head, eyes, nose, and mouth were normal.  NECK:  Without thyromegaly or adenopathy.  Carotid pulses were palpable  bilaterally.  Jugular venous distention was seen to the angle of the jaw  at 45 degrees.  LUNGS:  Clear (the patient was examined after he had received diuretic).  CARDIAC:  Examination revealed an irregularly irregular rhythm.  There  was no murmur, rub, or gallop.  No chest wall tenderness was noted.  ABDOMEN:  Soft and nontender.  There was no mass, hepatosplenomegaly,  bruit, distention, rebound, guarding, or rigidity.  Bowel sounds were  normal.  RECTAL AND GENITAL:  Examinations were not  performed as they were not  pertinent to the reason for acute care hospitalization.  EXTREMITIES:  Without edema, deviation, or deformity.  Radial and  dorsalis pedal pulses were palpable bilaterally.  NEUROLOGIC:  Brief screening neurologic survey was unremarkable.   LABORATORY AND ACCESSORY CLINICAL DATA:  The electrocardiogram revealed  atrial flutter with 4:1 AV conduction.  Anterior and inferior T wave  inversion was noted, suggesting the possibility of ischemia.   The initial set of cardiac markers revealed a myoglobin of 254, CK-MB  2.8, and troponin less than 0.05.  BNP was 995.  White count was 8.5  with a hemoglobin of 11.5 and hematocrit of 34.4.  Potassium was 3.5,  BUN 47, and creatinine 2.6.  The remaining studies were pending at the  time of this dictation.   IMPRESSION:  1. New onset of mild congestive heart failure; rule out myocardial      infarction.  The electrocardiogram demonstrated new, deep      anterolateral T wave inversion.  Cardiac catheterization in March      of 2007 demonstrated minimal inferior hypokinesis with an ejection      fraction estimated to be 50% to 55%.  2. Coronary artery disease.  Status post coronary artery bypass graft      surgery in 1997.  Status post percutaneous coronary interventions      in 2005 and March of 2007.  3. Status post permanent pacemaker for atrial flutter with slow      ventricular rate.  4. Hyperlipidemia.  5. Dyslipidemia.  6. Diabetes mellitus.  7. Chronic renal insufficiency.  Creatinine 2.6.  8. Chronic anemia.  9. Status post right renal artery stenting.  10.Human immunodeficiency virus-positive status.   PLAN:  1. Telemetry.  2. Serial cardiac enzymes.  3. Diuresis.  4. Daily weights, inputs and outputs, oxygen.  5. Echocardiogram.  6. No heparin or Lovenox, assuming INR is therapeutic.  7. Further measures per Dr. Swaziland.      Ulyses Amor, MD Electronically Signed      ______________________________  Peter M. Swaziland, M.D.    MSC/MEDQ  D:  12/05/2006  T:  12/05/2006  Job:  [id number]   cc:   Ulyses Amor, MD

## 2011-05-12 NOTE — H&P (Signed)
NAME:  [name] Proctor, [name] Proctor NO.:  [id number]   MEDICAL RECORD NO.:  [id number]          PATIENT TYPE:  EMS   LOCATION:  MAJO                         FACILITY:  [facility]   PHYSICIAN:  Cassell Clement, M.D. DATE OF BIRTH:  11/11/1961   DATE OF ADMISSION:  10/31/2005  DATE OF DISCHARGE:  10/31/2005                                HISTORY & PHYSICAL   CHIEF COMPLAINT:  Chest pain and left arm pain.   HISTORY:  A 76 year old Caucasian male admitted with chest and left arm  pain. He has known coronary artery disease. He has a past history of  coronary artery bypass surgery by Dr. Tyrone Sage in 1997. He had a stent  placed to his one of his bypass grafts in 2005 by Dr. Peter Swaziland because  of recurrent angina. The following year in 2006, he underwent stenting to  his left renal artery by Dr. Cari Caraway. Since about December 2006, the  patient has been noticing occasional angina which has been increasing in  frequency and in fact the patient was already scheduled to undergo cardiac  catheterization next Tuesday March 06, 2006 by Dr. Swaziland. However, today  the patient was moving some outdoor furniture and developed some left arm  discomfort and then after taking nitroglycerin felt near syncope, developed  pallor and diaphoresis and nausea. EMTs were summoned, gave her  nitroglycerin in the field and transported him to [facility] ER  where he was examined. The patient is painfree at the present time.   FAMILY HISTORY:  Reveals that the patient's father died at age 78 of heart  dropsy and hypertension. The patient's mother died of kidney failure and  diabetes 3. The patient is one of seven siblings, three of the siblings  have had bypass graft surgery.   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 He is a  nonsmoker, does not drink any alcohol.   ALLERGIES:  He is allergic to DARVOCET and ULTRACET.   PAST MEDICAL HISTORY:  Reveals that he had a  positive HIV since 1989. He had  presented with pneumocystis pneumonia. The patient has a history of  hypercholesterolemia. He has a history of mild diabetes treated with diet  alone.   OPERATIONS:  Include coronary bypass surgery in 1997 and tonsillectomy as a  child.   REVIEW OF SYSTEMS:  Otherwise unremarkable. He does have a history of some  gastroesophageal reflux but no history of bleeding ulcers. He is not having  any genitourinary symptoms but does have chronic nocturia. The remainder of  the review of systems is negative in detail.   PHYSICAL EXAM:  VITAL SIGNS: His blood pressure is 110/60. The pulse is 76  regular. Respirations normal.  GENERAL APPEARANCE: Reveals a well-developed, well-nourished thin gentleman  in no acute distress.  SKIN: Clear.  HEAD/NECK: Exam revealed a soft right carotid bruit. The jugular venous  pressure was normal. Thyroid normal. No lymphadenopathy.  CHEST: Clear.  HEART:  Reveals no murmur, gallop, rub or click.  ABDOMEN:  Soft without hepatosplenomegaly or masses. There are no renal  bruits audible.  EXTREMITIES:  Show good peripheral pulses. No phlebitis. No edema.  RECTAL: Deferred.   Chest x-ray shows no active disease. He does have evidence of his previous  bypass surgery. EKG shows normal sinus rhythm, possible old anteroseptal  myocardial infarction with slight ST elevation in V1-V3. There is new T-wave  inversion in leads III and aVF not present on previous EKG of February 02, 2005. Initial cardiac enzymes are normal with a CK-MB of 1.6, troponin less  than 0.05. Blood sugars 220, BUN 26, potassium 4.3.   IMPRESSION:  1.  Coronary heart disease with acute coronary syndrome with unstable      angina, rule out myocardial infarction.  2.  Status post coronary artery bypass graft in 1997.  3.  Status post stent of one of his coronary arteries grafts 2005.  4.  Status post stent of his left renal artery 2006.  5.  Diabetes mellitus.   6.  Hypercholesterolemia.  7.  Positive human immunodeficiency virus since 1989 followed at the [facility].   DISPOSITION:  We are admitting him to Dr. Peter Swaziland and telemetry. We  will treat with IV heparin, IV nitroglycerin. Treat with aspirin, Lopressor,  lisinopril and Statin therapy. We will continue his HIV medicines which he  is already on. He was scheduled to have cardiac catheterization on Tuesday,  March 06, 2006 and we will try to get that rescheduled sooner to Monday,  March 05, 2006 as schedule allows.           ______________________________  Cassell Clement, M.D.     TB/MEDQ  D:  03/03/2006  T:  03/03/2006  Job:  [id number]   cc:   Peter M. Swaziland, M.D.  Fax: [phone]   Haywood Pao, M.D.  [facility]

## 2011-05-30 ENCOUNTER — Encounter: Payer: Self-pay | Admitting: *Deleted

## 2011-05-30 ENCOUNTER — Telehealth: Payer: Self-pay | Admitting: Cardiology

## 2011-05-30 NOTE — Telephone Encounter (Signed)
Called in today needing Dr. Swaziland to send a referral to [facility] in [place] so he can get his pacemaker checked. Please call back. I have pulled the chart.

## 2011-05-30 NOTE — Telephone Encounter (Signed)
Called wanting a letter sent to [facility] in [place] so he can have pacer checked. He is going to call me back w/ phone # and contact person. Advised Dr. Swaziland on vac this week so would be next week.

## 2011-06-08 ENCOUNTER — Telehealth: Payer: Self-pay | Admitting: Cardiology

## 2011-06-08 NOTE — Telephone Encounter (Signed)
Spoke with patient and will work on getting cardiology referral for pacemaker checks

## 2011-06-08 NOTE — Telephone Encounter (Signed)
[facility] called [facility]

## 2011-06-08 NOTE — Telephone Encounter (Signed)
Tried to call and unable to leave message

## 2011-06-08 NOTE — Telephone Encounter (Signed)
Left message

## 2011-06-09 ENCOUNTER — Telehealth: Payer: Self-pay | Admitting: Cardiology

## 2011-06-09 NOTE — Telephone Encounter (Signed)
PT SAID HE WAS TO CALL ANITA BACK WITH SOME INFO RETARDING THE [facility].

## 2011-06-09 NOTE — Telephone Encounter (Signed)
App made for June 20@8 :30 [facility].

## 2011-07-27 ENCOUNTER — Telehealth: Payer: Self-pay | Admitting: Cardiology

## 2011-07-27 NOTE — Telephone Encounter (Signed)
Called wanting to speak with you. Patient did not wish to disclose the nature of the call. Please call back. I have pulled his chart.

## 2011-07-27 NOTE — Telephone Encounter (Signed)
lm

## 2011-08-02 ENCOUNTER — Telehealth: Payer: Self-pay | Admitting: *Deleted

## 2011-08-02 NOTE — Telephone Encounter (Signed)
Have left several messages for return call. Not sure nature of his call

## 2011-09-21 ENCOUNTER — Encounter: Payer: Self-pay | Admitting: Cardiology

## 2011-10-04 ENCOUNTER — Ambulatory Visit (INDEPENDENT_AMBULATORY_CARE_PROVIDER_SITE_OTHER): Payer: Medicare Other | Admitting: Cardiology

## 2011-10-04 ENCOUNTER — Encounter: Payer: Self-pay | Admitting: Cardiology

## 2011-10-04 VITALS — BP 156/77 | HR 68 | Ht 74.0 in | Wt 172.0 lb

## 2011-10-04 DIAGNOSIS — N189 Chronic kidney disease, unspecified: Secondary | ICD-10-CM

## 2011-10-04 DIAGNOSIS — I119 Hypertensive heart disease without heart failure: Secondary | ICD-10-CM | POA: Insufficient documentation

## 2011-10-04 DIAGNOSIS — I509 Heart failure, unspecified: Secondary | ICD-10-CM

## 2011-10-04 DIAGNOSIS — E1159 Type 2 diabetes mellitus with other circulatory complications: Secondary | ICD-10-CM | POA: Insufficient documentation

## 2011-10-04 DIAGNOSIS — I4892 Unspecified atrial flutter: Secondary | ICD-10-CM

## 2011-10-04 DIAGNOSIS — I5043 Acute on chronic combined systolic (congestive) and diastolic (congestive) heart failure: Secondary | ICD-10-CM | POA: Insufficient documentation

## 2011-10-04 DIAGNOSIS — I495 Sick sinus syndrome: Secondary | ICD-10-CM | POA: Insufficient documentation

## 2011-10-04 DIAGNOSIS — I10 Essential (primary) hypertension: Secondary | ICD-10-CM

## 2011-10-04 DIAGNOSIS — Z794 Long term (current) use of insulin: Secondary | ICD-10-CM

## 2011-10-04 DIAGNOSIS — N184 Chronic kidney disease, stage 4 (severe): Secondary | ICD-10-CM | POA: Insufficient documentation

## 2011-10-04 DIAGNOSIS — I2510 Atherosclerotic heart disease of native coronary artery without angina pectoris: Secondary | ICD-10-CM

## 2011-10-04 DIAGNOSIS — E119 Type 2 diabetes mellitus without complications: Secondary | ICD-10-CM

## 2011-10-04 NOTE — Assessment & Plan Note (Signed)
He is status post redo coronary bypass surgery March of 2010. He remains asymptomatic. I have requested copies of his most recent lab work and ECG from the [facility]. We will continue with risk factor modification.

## 2011-10-04 NOTE — Assessment & Plan Note (Signed)
He has a history of chronic diastolic dysfunction. He is well compensated today and appears to be euvolemic.

## 2011-10-04 NOTE — Progress Notes (Signed)
[name] Proctor Date of Birth: 14-Sep-1935   History of Present Illness: [name] is seen today for followup. He has not been seen for a year. He is now receiving his care at the [facility]. He reports that his pacemaker battery died in 2023/02/03 and he underwent replacement of this in July. This suggested there was some pacemaker malfunction because he had a normal pacemaker check in October and his pacemaker battery was only 45-1/74 years old when it died. His pacemaker change out went well. He denies any palpitations. He's had no chest pain or shortness of breath. He denies any edema. He has a complex cardiac history. He has a history of coronary disease and is status post redo coronary bypass surgery March of 2010. This included a new saphenous vein graft to the obtuse marginal vessel and a sequential saphenous vein graft to the PDA and posterior lateral branches of the right coronary. His original LIMA graft to the LAD was still patent. He has a history of sick sinus syndrome and has had previous pacemaker implant. He's had a history of congestive heart failure secondary to diastolic dysfunction. He has had a previous history of atrial flutter with successful radiofrequency ablation.  Current Outpatient Prescriptions on File Prior to Visit  Medication Sig Dispense Refill  . abacavir (ZIAGEN) 300 MG tablet Take 300 mg by mouth daily.        Marland Kitchen aspirin 81 MG tablet Take 81 mg by mouth daily.        Marland Kitchen atorvastatin (LIPITOR) 10 MG tablet Take 10 mg by mouth daily.        . carvedilol (COREG) 25 MG tablet Take 25 mg by mouth 2 (two) times daily with a meal.        . etravirine (INTELENCE) 100 MG tablet Take 200 mg by mouth 4 (four) times daily.        . furosemide (LASIX) 40 MG tablet Take 40 mg by mouth 3 (three) times daily.        . INSULIN ISOPHANE & REGULAR [place] Inject into the skin.        Marland Kitchen insulin NPH (HUMULIN N,NOVOLIN N) 100 UNIT/ML injection Inject into the skin.        Marland Kitchen levothyroxine (SYNTHROID,  LEVOTHROID) 50 MCG tablet Take 50 mcg by mouth 2 (two) times daily.        Marland Kitchen omeprazole (PRILOSEC) 20 MG capsule Take 20 mg by mouth daily.        . potassium chloride SA (K-DUR,KLOR-CON) 20 MEQ tablet Take 20 mEq by mouth 3 (three) times daily.          Allergies  Allergen Reactions  . Darvocet (Propoxyphene N-Acetaminophen)   . Seldane (Terfenadine)   . Ultracet (Tramadol-Acetaminophen)     Past Medical History  Diagnosis Date  . CHF (congestive heart failure)   . History of diastolic dysfunction   . SSS (sick sinus syndrome)   . Coronary artery disease   . History of atrial flutter   . Chronic renal insufficiency   . Diabetes mellitus   . History of GI bleed   . Gastritis   . Esophagitis   . Duodenal ulcer   . IDA (iron deficiency anemia)   . Hypertension   . Hyperlipidemia   . Hypothyroidism   . HIV positive   . RAS (renal artery stenosis)     Past Surgical History  Procedure Date  . Cardiac catheterization 02/22/2009    EF 40-45%  .  Coronary angioplasty 03/06/2006    INTRACORONARY STENTING OF SAPHENOUS VEIN GRAFT TO THE PDA AND INTRACORONARY STENTING OF THE SAPHENOUS VEIN GRAFT TO THE DIAGONAL  . Insert / replace / remove pacemaker     IMPLANT  . Ablation saphenous vein w/ rfa   . Renal artery stent     LEFT  . Coronary artery bypass graft 02/2009    REDO. NEW SAPHENOUS VEIN GRAFT TO THE OBTUSE MARGINAL VESSEL AND A SEQUENTIAL SAPHENOUS VEIN GRAFT TO THE PDA AND POSTERIOR LATERAL BRANCHES OF THE RIGHT CORONARY. THE LIMA GRAFT FROM HIS ORIGINAL SURGERY WAS STILL PATENT.  . US echocardiography 04/05/2009    EF 50%  . Cardiovascular stress test 03/24/2004    EF 52%    History  Smoking status  . Never Smoker   Smokeless tobacco  . Not on file    History  Alcohol Use No    Family History  Problem Relation Age of Onset  . Kidney disease Mother   . Hypertension Father   . Crohn's disease Daughter   . Lupus Sister     Review of Systems: The review of  systems is positive for good control of his blood sugar and blood pressure. One of his antiviral agents caused him to have severe stiffness and arthralgias in his legs. This has been changed. He did have an episode of shingles last January.  All other systems were reviewed and are negative.  Physical Exam: BP 156/77  Pulse 68  Ht 6\' 2"  (1.88 m)  Wt 172 lb (78.019 kg)  BMI 22.08 kg/m2 The patient is alert and oriented x 3.  The mood and affect are normal.  The skin is warm and dry.  Color is normal.  The HEENT exam reveals that the sclera are nonicteric.  The mucous membranes are moist.  The carotids are 2+ without bruits.  There is no thyromegaly.  There is no JVD.  The lungs are clear.  The chest wall is non tender. His pacemaker site has healed well. The heart exam reveals a regular rate with a normal S1 and S2.  There are no murmurs, gallops, or rubs.  The PMI is not displaced.   Abdominal exam reveals good bowel sounds.  There is no guarding or rebound.  There is no hepatosplenomegaly or tenderness.  There are no masses.  Exam of the legs reveal no clubbing, cyanosis, or edema.  The legs are without rashes.  The distal pulses are intact.  Cranial nerves II - XII are intact.  Motor and sensory functions are intact.  The gait is normal. LABORATORY DATA:   Assessment / Plan:

## 2011-10-04 NOTE — Assessment & Plan Note (Signed)
He is status post pacemaker change out this year. He will have regular followup of this with the [facility] in [place].

## 2011-10-04 NOTE — Patient Instructions (Signed)
Keep an eye on your blood pressure. Let me know if it stays high.  I will see you again 6 months.  Send me a copy of your lab work from the [facility].

## 2011-10-05 ENCOUNTER — Ambulatory Visit: Payer: Self-pay | Admitting: Cardiology

## 2012-04-02 ENCOUNTER — Ambulatory Visit: Payer: Medicare Other | Admitting: Cardiology

## 2012-04-30 ENCOUNTER — Telehealth: Payer: Self-pay | Admitting: Cardiology

## 2012-04-30 NOTE — Telephone Encounter (Signed)
Walk in pt form "Pt dropped off DMV paper that needs to be filled out "  Sent to Cheryl/Jordan 04/30/12 TK

## 2012-05-09 ENCOUNTER — Telehealth: Payer: Self-pay

## 2012-05-09 NOTE — Telephone Encounter (Signed)
Patient called was told form from dept of transportation is completed and signed by Dr.Jordan.Form will be left at front desk 3 rd floor.

## 2012-05-17 NOTE — Telephone Encounter (Signed)
DMV paper Completed [place] Signed  05/17/12/KM

## 2012-06-12 ENCOUNTER — Ambulatory Visit: Payer: Medicare Other | Admitting: Cardiology

## 2012-07-07 ENCOUNTER — Emergency Department (HOSPITAL_COMMUNITY): Payer: Medicare Other

## 2012-07-07 ENCOUNTER — Encounter (HOSPITAL_COMMUNITY): Payer: Self-pay

## 2012-07-07 ENCOUNTER — Inpatient Hospital Stay (HOSPITAL_COMMUNITY)
Admission: EM | Admit: 2012-07-07 | Discharge: 2012-07-09 | DRG: 308 | Disposition: A | Discharge status: Home / Self Care | Payer: Medicare Other | Attending: Cardiology | Admitting: Cardiology

## 2012-07-07 DIAGNOSIS — I2510 Atherosclerotic heart disease of native coronary artery without angina pectoris: Secondary | ICD-10-CM | POA: Diagnosis present

## 2012-07-07 DIAGNOSIS — E1159 Type 2 diabetes mellitus with other circulatory complications: Secondary | ICD-10-CM

## 2012-07-07 DIAGNOSIS — Z21 Asymptomatic human immunodeficiency virus [HIV] infection status: Secondary | ICD-10-CM | POA: Diagnosis present

## 2012-07-07 DIAGNOSIS — I4891 Unspecified atrial fibrillation: Secondary | ICD-10-CM

## 2012-07-07 DIAGNOSIS — E785 Hyperlipidemia, unspecified: Secondary | ICD-10-CM | POA: Insufficient documentation

## 2012-07-07 DIAGNOSIS — I509 Heart failure, unspecified: Secondary | ICD-10-CM | POA: Diagnosis present

## 2012-07-07 DIAGNOSIS — E039 Hypothyroidism, unspecified: Secondary | ICD-10-CM | POA: Insufficient documentation

## 2012-07-07 DIAGNOSIS — I5033 Acute on chronic diastolic (congestive) heart failure: Secondary | ICD-10-CM

## 2012-07-07 DIAGNOSIS — I4892 Unspecified atrial flutter: Secondary | ICD-10-CM | POA: Insufficient documentation

## 2012-07-07 DIAGNOSIS — I5043 Acute on chronic combined systolic (congestive) and diastolic (congestive) heart failure: Secondary | ICD-10-CM | POA: Diagnosis present

## 2012-07-07 DIAGNOSIS — B20 Human immunodeficiency virus [HIV] disease: Secondary | ICD-10-CM | POA: Insufficient documentation

## 2012-07-07 DIAGNOSIS — Z950 Presence of cardiac pacemaker: Secondary | ICD-10-CM

## 2012-07-07 DIAGNOSIS — I499 Cardiac arrhythmia, unspecified: Secondary | ICD-10-CM | POA: Insufficient documentation

## 2012-07-07 DIAGNOSIS — Z8679 Personal history of other diseases of the circulatory system: Secondary | ICD-10-CM

## 2012-07-07 HISTORY — DX: Presence of cardiac pacemaker: Z95.0

## 2012-07-07 HISTORY — DX: Unspecified atrial fibrillation: I48.91

## 2012-07-07 LAB — CBC WITH DIFFERENTIAL/PLATELET
Eosinophils Absolute: 0.1 10*3/uL (ref 0.0–0.7)
Eosinophils Relative: 1 % (ref 0–5)
HCT: 34.9 % — ABNORMAL LOW (ref 39.0–52.0)
Lymphs Abs: 1.7 10*3/uL (ref 0.7–4.0)
MCH: 28.3 pg (ref 26.0–34.0)
MCV: 86.6 fL (ref 78.0–100.0)
Monocytes Absolute: 0.8 10*3/uL (ref 0.1–1.0)
Monocytes Relative: 9 % (ref 3–12)
Platelets: 173 10*3/uL (ref 150–400)
RBC: 4.03 MIL/uL — ABNORMAL LOW (ref 4.22–5.81)

## 2012-07-07 LAB — COMPREHENSIVE METABOLIC PANEL
BUN: 68 mg/dL — ABNORMAL HIGH (ref 6–23)
BUN: 69 mg/dL — ABNORMAL HIGH (ref 6–23)
CO2: 24 mEq/L (ref 19–32)
Calcium: 9.3 mg/dL (ref 8.4–10.5)
Calcium: 9.3 mg/dL (ref 8.4–10.5)
Creatinine, Ser: 2.11 mg/dL — ABNORMAL HIGH (ref 0.50–1.35)
Creatinine, Ser: 2.05 mg/dL — ABNORMAL HIGH (ref 0.50–1.35)
GFR calc Af Amer: 34 mL/min — ABNORMAL LOW (ref >90)
GFR calc Af Amer: 35 mL/min — ABNORMAL LOW (ref >90)
GFR calc non Af Amer: 29 mL/min — ABNORMAL LOW (ref >90)
GFR calc non Af Amer: 30 mL/min — ABNORMAL LOW (ref >90)
Glucose, Bld: 127 mg/dL — ABNORMAL HIGH (ref 70–99)
Glucose, Bld: 162 mg/dL — ABNORMAL HIGH (ref 70–99)
Total Protein: 7.5 g/dL (ref 6.0–8.3)
Total Protein: 7.1 g/dL (ref 6.0–8.3)

## 2012-07-07 LAB — CK TOTAL AND CKMB (NOT AT ARMC)
Relative Index: INVALID (ref 0.0–2.5)
Total CK: 82 U/L (ref 7–232)

## 2012-07-07 LAB — URINALYSIS, ROUTINE W REFLEX MICROSCOPIC
Bilirubin Urine: NEGATIVE
Hgb urine dipstick: NEGATIVE
Ketones, ur: NEGATIVE mg/dL
Nitrite: NEGATIVE
Urobilinogen, UA: 0.2 mg/dL (ref 0.0–1.0)

## 2012-07-07 LAB — APTT: aPTT: 34 seconds (ref 24–37)

## 2012-07-07 LAB — GLUCOSE, CAPILLARY: Glucose-Capillary: 172 mg/dL — ABNORMAL HIGH (ref 70–99)

## 2012-07-07 LAB — POCT I-STAT TROPONIN I

## 2012-07-07 LAB — PRO B NATRIURETIC PEPTIDE: Pro B Natriuretic peptide (BNP): 13805 pg/mL — ABNORMAL HIGH (ref 0–450)

## 2012-07-07 LAB — PROTIME-INR: INR: 1.15 (ref 0.00–1.49)

## 2012-07-07 MED ORDER — PANTOPRAZOLE SODIUM 40 MG PO TBEC
40.0000 mg | DELAYED_RELEASE_TABLET | Freq: Every day | ORAL | Status: DC
  Administered 2012-07-08: 40 mg via ORAL
  Filled 2012-07-07: qty 1

## 2012-07-07 MED ORDER — SODIUM CHLORIDE 0.9 % IV SOLN
250.0000 mL | INTRAVENOUS | Status: DC | PRN
  Administered 2012-07-07: 250 mL via INTRAVENOUS

## 2012-07-07 MED ORDER — FUROSEMIDE 10 MG/ML IJ SOLN
80.0000 mg | Freq: Two times a day (BID) | INTRAMUSCULAR | Status: DC
  Administered 2012-07-07: 80 mg via INTRAVENOUS
  Filled 2012-07-07 (×4): qty 8

## 2012-07-07 MED ORDER — ASPIRIN 81 MG PO TABS: 81.0000 mg | ORAL_TABLET | Freq: Every day | ORAL | Status: DC

## 2012-07-07 MED ORDER — ALPRAZOLAM 0.25 MG PO TABS: 0.2500 mg | ORAL_TABLET | Freq: Two times a day (BID) | ORAL | Status: DC | PRN

## 2012-07-07 MED ORDER — CARVEDILOL 25 MG PO TABS
25.0000 mg | ORAL_TABLET | Freq: Two times a day (BID) | ORAL | Status: DC
  Administered 2012-07-08 – 2012-07-09 (×3): 25 mg via ORAL
  Filled 2012-07-07 (×5): qty 1

## 2012-07-07 MED ORDER — INSULIN ASPART 100 UNIT/ML ~~LOC~~ SOLN
0.0000 [IU] | Freq: Three times a day (TID) | SUBCUTANEOUS | Status: DC
  Administered 2012-07-08: 1 [IU] via SUBCUTANEOUS
  Administered 2012-07-08: 5 [IU] via SUBCUTANEOUS

## 2012-07-07 MED ORDER — INSULIN ASPART 100 UNIT/ML ~~LOC~~ SOLN
20.0000 [IU] | Freq: Three times a day (TID) | SUBCUTANEOUS | Status: DC
  Administered 2012-07-08 (×2): 20 [IU] via SUBCUTANEOUS
  Administered 2012-07-08: 10 [IU] via SUBCUTANEOUS
  Administered 2012-07-09: 20 [IU] via SUBCUTANEOUS

## 2012-07-07 MED ORDER — POTASSIUM CHLORIDE CRYS ER 20 MEQ PO TBCR
20.0000 meq | EXTENDED_RELEASE_TABLET | Freq: Three times a day (TID) | ORAL | Status: DC
  Administered 2012-07-07 – 2012-07-09 (×5): 20 meq via ORAL
  Filled 2012-07-07 (×7): qty 1

## 2012-07-07 MED ORDER — CALCIUM CARBONATE 1250 (500 CA) MG PO TABS
2.0000 | ORAL_TABLET | Freq: Every day | ORAL | Status: DC
  Administered 2012-07-08 – 2012-07-09 (×2): 1000 mg via ORAL
  Filled 2012-07-07 (×2): qty 2

## 2012-07-07 MED ORDER — PREDNISOLONE 5 MG PO TABS: 15.0000 mg | ORAL_TABLET | Freq: Every day | ORAL | Status: DC

## 2012-07-07 MED ORDER — ONDANSETRON HCL 4 MG/2ML IJ SOLN
4.0000 mg | Freq: Four times a day (QID) | INTRAMUSCULAR | Status: DC | PRN
  Administered 2012-07-08: 4 mg via INTRAVENOUS
  Filled 2012-07-07: qty 2

## 2012-07-07 MED ORDER — LEVOTHYROXINE SODIUM 50 MCG PO TABS
50.0000 ug | ORAL_TABLET | Freq: Every day | ORAL | Status: DC
  Administered 2012-07-08 – 2012-07-09 (×2): 50 ug via ORAL
  Filled 2012-07-07 (×3): qty 1

## 2012-07-07 MED ORDER — SODIUM CHLORIDE 0.9 % IJ SOLN
3.0000 mL | Freq: Two times a day (BID) | INTRAMUSCULAR | Status: DC
  Administered 2012-07-07 – 2012-07-09 (×3): 3 mL via INTRAVENOUS

## 2012-07-07 MED ORDER — SODIUM CHLORIDE 0.9 % IJ SOLN: 3.0000 mL | INTRAMUSCULAR | Status: DC | PRN

## 2012-07-07 MED ORDER — ATORVASTATIN CALCIUM 10 MG PO TABS
10.0000 mg | ORAL_TABLET | Freq: Every day | ORAL | Status: DC
  Administered 2012-07-08: 10 mg via ORAL
  Filled 2012-07-07 (×3): qty 1

## 2012-07-07 MED ORDER — INSULIN REGULAR HUMAN 100 UNIT/ML IJ SOLN: 20.0000 [IU] | Freq: Three times a day (TID) | INTRAMUSCULAR | Status: DC

## 2012-07-07 MED ORDER — HEPARIN (PORCINE) IN NACL 100-0.45 UNIT/ML-% IJ SOLN
1100.0000 [IU]/h | INTRAMUSCULAR | Status: DC
  Administered 2012-07-07: 1100 [IU]/h via INTRAVENOUS
  Filled 2012-07-07 (×2): qty 250

## 2012-07-07 MED ORDER — ACETAMINOPHEN 325 MG PO TABS: 650.0000 mg | ORAL_TABLET | ORAL | Status: DC | PRN

## 2012-07-07 MED ORDER — INSULIN ASPART 100 UNIT/ML ~~LOC~~ SOLN: 0.0000 [IU] | Freq: Every day | SUBCUTANEOUS | Status: DC

## 2012-07-07 MED ORDER — PREDNISONE 5 MG PO TABS
15.0000 mg | ORAL_TABLET | Freq: Every day | ORAL | Status: DC
  Administered 2012-07-08 – 2012-07-09 (×2): 15 mg via ORAL
  Filled 2012-07-07 (×3): qty 1

## 2012-07-07 MED ORDER — LAMIVUDINE 150 MG PO TABS
150.0000 mg | ORAL_TABLET | Freq: Every day | ORAL | Status: DC
  Administered 2012-07-08 – 2012-07-09 (×2): 150 mg via ORAL
  Filled 2012-07-07 (×2): qty 1

## 2012-07-07 MED ORDER — ASPIRIN EC 81 MG PO TBEC
81.0000 mg | DELAYED_RELEASE_TABLET | Freq: Every day | ORAL | Status: DC
  Administered 2012-07-07 – 2012-07-09 (×3): 81 mg via ORAL
  Filled 2012-07-07 (×3): qty 1

## 2012-07-07 MED ORDER — ABACAVIR SULFATE 300 MG PO TABS
300.0000 mg | ORAL_TABLET | Freq: Every day | ORAL | Status: DC
  Administered 2012-07-08 – 2012-07-09 (×2): 300 mg via ORAL
  Filled 2012-07-07 (×2): qty 1

## 2012-07-07 MED ORDER — HEPARIN BOLUS VIA INFUSION
4000.0000 [IU] | Freq: Once | INTRAVENOUS | Status: AC
  Administered 2012-07-07: 4000 [IU] via INTRAVENOUS

## 2012-07-07 MED ORDER — LEVOTHYROXINE SODIUM 50 MCG PO TABS: 50.0000 ug | ORAL_TABLET | Freq: Two times a day (BID) | ORAL | Status: DC

## 2012-07-07 MED ORDER — ETRAVIRINE 100 MG PO TABS
200.0000 mg | ORAL_TABLET | Freq: Four times a day (QID) | ORAL | Status: DC
  Administered 2012-07-07 – 2012-07-09 (×6): 200 mg via ORAL
  Filled 2012-07-07 (×10): qty 2

## 2012-07-07 NOTE — ED Provider Notes (Addendum)
History     CSN: [id number]  Arrival date & time 07/07/12  1509   First MD Initiated Contact with Patient 07/07/12 1540      Chief Complaint  Patient presents with  . Shortness of Breath  . Irregular Heart Beat    (Consider location/radiation/quality/duration/timing/severity/associated sxs/prior treatment) HPI Comments: [name] Proctor is a 77 y.o. Male who presents with episodic shortness of breath that began last night. Currently in emergency department after driving here, he is back to his baseline. He denies chest pain, weakness, dizziness, and nausea, or vomiting. He, states that he felt like this previously when his pacemaker was mal-functioning. He reports that in April 2013. He had his battery changed in his pacemaker, at the [facility]. The pacemaker was initially placed here in 2010. He has been taking his medicines regularly, including today's doses. His pacemaker is    [employer]tronic 5076-52 cm, serial number [id number]  [id number].   His shortness of breath is worse with exertion, and improves with rest. He also feels that it is exacerbated by sleeping last night in a supine position. There are no palliative factors.  Patient is a 77 y.o. male presenting with shortness of breath. The history is provided by the patient.  Shortness of Breath  Associated symptoms include shortness of breath.    Past Medical History  Diagnosis Date  . CHF (congestive heart failure)   . History of diastolic dysfunction   . SSS (sick sinus syndrome)   . Coronary artery disease   . History of atrial flutter   . Chronic renal insufficiency   . Diabetes mellitus type 2, insulin dependent   . History of GI bleed   . Gastritis   . Esophagitis   . Duodenal ulcer   . IDA (iron deficiency anemia)   . Hypertension   . Hyperlipidemia   . Hypothyroidism   . HIV positive   . RAS (renal artery stenosis)     Past Surgical History  Procedure Date  . Cardiac catheterization 02/22/2009    EF 40-45%  . Coronary  angioplasty 03/06/2006    INTRACORONARY STENTING OF SAPHENOUS VEIN GRAFT TO THE PDA AND INTRACORONARY STENTING OF THE SAPHENOUS VEIN GRAFT TO THE DIAGONAL  . Insert / replace / remove pacemaker     IMPLANT  . Ablation saphenous vein w/ rfa   . Renal artery stent     LEFT  . Coronary artery bypass graft 02/2009    REDO. NEW SAPHENOUS VEIN GRAFT TO THE OBTUSE MARGINAL VESSEL AND A SEQUENTIAL SAPHENOUS VEIN GRAFT TO THE PDA AND POSTERIOR LATERAL BRANCHES OF THE RIGHT CORONARY. THE LIMA GRAFT FROM HIS ORIGINAL SURGERY WAS STILL PATENT.  Marland Kitchen Pacemaker generator change 3/12    done at [facility] History  Problem Relation Age of Onset  . Kidney disease Mother   . Hypertension Father   . Crohn's disease Daughter   . Lupus Sister     History  Substance Use Topics  . Smoking status: Never Smoker   . Smokeless tobacco: Not on file  . Alcohol Use: No      Review of Systems  Respiratory: Positive for shortness of breath.   All other systems reviewed and are negative.    Allergies  Darvocet; Seldane; and Ultracet  Home Medications   No current outpatient prescriptions on file.  BP 126/70  Pulse 105  Temp 98.2 F (36.8 C) (Oral)  Resp 20  Ht 6\' 2"  (1.88 m)  Wt 166  lb 0.1 oz (75.3 kg)  BMI 21.31 kg/m2  SpO2 98%  Physical Exam  Nursing note and vitals reviewed. Constitutional: He is oriented to person, place, and time. He appears well-developed and well-nourished. No distress.  HENT:  Head: Normocephalic and atraumatic.  Right Ear: External ear normal.  Left Ear: External ear normal.  Eyes: Conjunctivae and EOM are normal. Pupils are equal, round, and reactive to light.  Neck: Normal range of motion and phonation normal. Neck supple.  Cardiovascular: Normal rate, normal heart sounds and intact distal pulses.        Irregular heart rate. Pulses palpated. Right radial artery is irregular with varying impulse intensity. Heart rate increases from supine to sitting, and he  becomes tachycardic. Cardiac monitor, visualization during examination: Periods of native pacing, AV pacing, and V. Paced, are all noted. V., pacing was seen at 110 beats per minute, while sitting.   Pulmonary/Chest: Effort normal and breath sounds normal. No respiratory distress. He has no wheezes. He has no rales. He exhibits no bony tenderness.       Good air movement bilaterally  Abdominal: Soft. Normal appearance. There is no tenderness.  Musculoskeletal: Normal range of motion.  Neurological: He is alert and oriented to person, place, and time. He has normal strength. No cranial nerve deficit or sensory deficit. He exhibits normal muscle tone. Coordination normal.  Skin: Skin is warm, dry and intact.  Psychiatric: He has a normal mood and affect. His behavior is normal. Judgment and thought content normal.    ED Course  Procedures (including critical care time)  Reevaluation: 18:18- patient remains comfortable. Repeated vital signs are stable. Patient's pacemaker has been interrogated. Per the report from [employer], the pacemaker is functioning within its parameters. The patient has increasing episodes of atrial tachycardia. These appear to have begun in January of this year. I have consulted with cardiology, who will evaluate the patient in emergency department.   Date: 07/08/2012  Rate: 89  Rhythm: AV paced  QRS Axis:   Intervals:   ST/T Wave abnormalities: nonspecific T wave abnormality  Conduction Disutrbances:NA  Narrative Interpretation:   Old EKG Reviewed: changes noted   Labs Reviewed  CBC WITH DIFFERENTIAL - Abnormal; Notable for the following:    RBC 4.03 (*)     Hemoglobin 11.4 (*)     HCT 34.9 (*)     RDW 18.3 (*)     All other components within normal limits  COMPREHENSIVE METABOLIC PANEL - Abnormal; Notable for the following:    Glucose, Bld 127 (*)     BUN 68 (*)     Creatinine, Ser 2.11 (*)     Albumin 3.1 (*)     GFR calc non Af Amer 29 (*)     GFR calc  Af Amer 34 (*)     All other components within normal limits  CK TOTAL AND CKMB - Abnormal; Notable for the following:    CK, MB 5.3 (*)     All other components within normal limits  PRO B NATRIURETIC PEPTIDE - Abnormal; Notable for the following:    Pro B Natriuretic peptide (BNP) 13805.0 (*)     All other components within normal limits  POCT I-STAT TROPONIN I - Abnormal; Notable for the following:    Troponin i, poc 0.17 (*)     All other components within normal limits  COMPREHENSIVE METABOLIC PANEL - Abnormal; Notable for the following:    Glucose, Bld 162 (*)  BUN 69 (*)     Creatinine, Ser 2.05 (*)     Albumin 3.0 (*)     GFR calc non Af Amer 30 (*)     GFR calc Af Amer 35 (*)     All other components within normal limits  GLUCOSE, CAPILLARY - Abnormal; Notable for the following:    Glucose-Capillary 172 (*)     All other components within normal limits  URINALYSIS, ROUTINE W REFLEX MICROSCOPIC  TROPONIN I  APTT  PROTIME-INR  TSH  BASIC METABOLIC PANEL  HEMOGLOBIN A1C  CBC  HEPARIN LEVEL (UNFRACTIONATED)   Dg Chest 2 View  07/07/2012  *RADIOLOGY REPORT*  Clinical Data: Shortness of breath.  Diabetes and hypertension. Coronary artery disease.  CHEST - 2 VIEW  Comparison: 07/10/2009  Findings: Mild cardiomegaly stable.  Both lungs are clear.  No evidence of pleural effusion.  No mass or lymphadenopathy identified.  Transvenous pacemaker leads remain in appropriate position.  Prior CABG again noted.  IMPRESSION: Stable cardiomegaly.  No active lung disease.  Original Report Authenticated By: Danae Orleans, M.D.     1. Atrial fibrillation   2. CHF (congestive heart failure)       MDM  Shortness of breath with palpitation, in patient with functioning pacemaker. He appears fluid overloaded with elevated BNP, much at baseline, but chest x-ray does not indicate congestive heart failure. Cardiac echo, April 2010 revealed ejection fraction 50%.   Elevated troponin, likely  related to renal insufficiency. The CK-MB is negative for evidence of infarction. Renal insufficiency is stable. Patient appears to have new-onset atrial fibrillation. That is persistent, and may require adjustment of pacemaker function, to improve cardiovascular status.   Plan: Admit- Cardiology Arlyn Leak for [place])      Flint Melter, MD 07/07/12 1832  Flint Melter, MD 07/08/12 [phone]

## 2012-07-07 NOTE — Progress Notes (Signed)
ANTICOAGULATION CONSULT NOTE - Initial Consult  Pharmacy Consult for Heparin Indication: atrial fibrillation new onset  Allergies  Allergen Reactions  . Darvocet (Propoxyphene-Acetaminophen)   . Seldane (Terfenadine)   . Ultracet (Tramadol-Acetaminophen)     Patient Measurements:   Total Body Weight: 78kg Ht 74in  Vital Signs: Temp: 98.3 F (36.8 C) (07/14 1926) Temp src: Oral (07/14 1926) BP: 126/79 mmHg (07/14 1926) Pulse Rate: 100  (07/14 1926)  Labs:  Basename 07/07/12 1644 07/07/12 1547 07/07/12 1546  HGB -- -- 11.4*  HCT -- -- 34.9*  PLT -- -- 173  APTT -- -- --  LABPROT -- -- --  INR -- -- --  HEPARINUNFRC -- -- --  CREATININE -- -- 2.11*  CKTOTAL -- 82 --  CKMB -- 5.3* --  TROPONINI <0.30 -- --    The CrCl is unknown because both a height and weight (above a minimum accepted value) are required for this calculation.   Medical History: Past Medical History  Diagnosis Date  . CHF (congestive heart failure)   . History of diastolic dysfunction   . SSS (sick sinus syndrome)   . Coronary artery disease   . History of atrial flutter   . Chronic renal insufficiency   . Diabetes mellitus type 2, insulin dependent   . History of GI bleed   . Gastritis   . Esophagitis   . Duodenal ulcer   . IDA (iron deficiency anemia)   . Hypertension   . Hyperlipidemia   . Hypothyroidism   . HIV positive   . RAS (renal artery stenosis)    Assessment: 77yom admitted for SOB/palpatations.  New onset A-Fib in ED.  He is not on anticoagulation at home per med list, INR pending, CBC stable.   Goal of Therapy:  Heparin level 0.3-0.7 units/ml Monitor platelets by anticoagulation protocol: Yes   Plan:  Heparin bolus 4000 uts iv x1 Heparin drip rate 1100 uts/hr Cbc and HL 6hr after drip started and daily  Marcelino Scot 07/07/2012,7:51 PM

## 2012-07-07 NOTE — ED Notes (Signed)
Pt sts pacemaker is set for 69 hr,

## 2012-07-07 NOTE — ED Notes (Signed)
Pacemaker interrogated at this time.

## 2012-07-07 NOTE — ED Notes (Signed)
Pt here sob and irregular hr, has pm and had batteries changed in march, hr in triage 44

## 2012-07-07 NOTE — H&P (Signed)
History and Physical   Admit date: 07/07/2012 Name:  [name] Proctor Medical record number: [id number] DOB/Age:  75-Apr-1936  77 y.o.  Referring Physician:  [facility] Primary Cardiologist: Dr. Peter [place] Primary Physician: [facility] Chief complaint/reason for admission: Dyspnea, feels like pacemaker not working right  HPI:  This 77 year old male has a history of coronary artery disease and diastolic heart failure as well as atrial arrhythmias. He has known HIV infection. He has had coronary artery disease since 1997 and had bypass grafting then. He had redo bypass grafting in 2010 by Dr. Laneta Simmers with vein grafts to the circumflex and right coronary artery systems. His mammary graft was patent at that time. He has also had atrial arrhythmias and has a previous permanent pacemaker implanted in 2007. He had an atrial flutter ablation done previously and had to have a generator change because of premature battery depletion in March of this year at the [place].  He has had a complex history recently. He was placed on prednisone about 2 weeks ago at the [facility] is of difficulty with his muscles and walking. He cannot give exact details of what is going on with that. He presented to the emergency room with a two-day history of worsening shortness of breath and a feeling as if his pulse was irregular and his heart was beating erratically. He has not had any chest discomfort suggestive of angina. He has mild edema. He has no PND or orthopnea.   Past Medical History  Diagnosis Date  . CHF (congestive heart failure)   . History of diastolic dysfunction   . SSS (sick sinus syndrome)   . Coronary artery disease   . History of atrial flutter   . Chronic renal insufficiency   . Diabetes mellitus type 2, insulin dependent   . History of GI bleed   . Gastritis   . Esophagitis   . Duodenal ulcer   . IDA (iron deficiency anemia)   . Hypertension   . Hyperlipidemia   . Hypothyroidism   . HIV  positive   . RAS (renal artery stenosis)      Past Surgical History  Procedure Date  . Cardiac catheterization 02/22/2009    EF 40-45%  . Coronary angioplasty 03/06/2006    INTRACORONARY STENTING OF SAPHENOUS VEIN GRAFT TO THE PDA AND INTRACORONARY STENTING OF THE SAPHENOUS VEIN GRAFT TO THE DIAGONAL  . Insert / replace / remove pacemaker     IMPLANT  . Ablation saphenous vein w/ rfa   . Renal artery stent     LEFT  . Coronary artery bypass graft 02/2009    REDO. NEW SAPHENOUS VEIN GRAFT TO THE OBTUSE MARGINAL VESSEL AND A SEQUENTIAL SAPHENOUS VEIN GRAFT TO THE PDA AND POSTERIOR LATERAL BRANCHES OF THE RIGHT CORONARY. THE LIMA GRAFT FROM HIS ORIGINAL SURGERY WAS STILL PATENT.  Marland Kitchen Pacemaker generator change 3/12    done at [facility]   Allergies: is allergic to darvocet; seldane; and ultracet.   Medications: Prior to Admission medications   Medication Sig Start Date End Date Taking? Authorizing Provider  abacavir (ZIAGEN) 300 MG tablet Take 300 mg by mouth daily.     Yes Historical Provider, MD  aspirin 81 MG tablet Take 81 mg by mouth daily.     Yes Historical Provider, MD  atorvastatin (LIPITOR) 10 MG tablet Take 10 mg by mouth daily.     Yes Historical Provider, MD  calcium carbonate (OS-CAL - DOSED IN MG OF ELEMENTAL CALCIUM) 1250  MG tablet Take 2 tablets by mouth daily.   Yes Historical Provider, MD  carvedilol (COREG) 25 MG tablet Take 25 mg by mouth 2 (two) times daily with a meal.     Yes Historical Provider, MD  etravirine (INTELENCE) 100 MG tablet Take 200 mg by mouth 4 (four) times daily.     Yes Historical Provider, MD  furosemide (LASIX) 40 MG tablet Take 40 mg by mouth 3 (three) times daily.     Yes Historical Provider, MD  insulin regular (NOVOLIN R,HUMULIN R) 100 units/mL injection Inject 20 Units into the skin 3 (three) times daily before meals.   Yes Historical Provider, MD  Isopropyl Alcohol (ALCOHOL PREPS EX) Apply topically.     Yes Historical Provider, MD  lamiVUDine  (EPIVIR) 150 MG tablet Take 150 mg by mouth.   Yes Historical Provider, MD  levothyroxine (SYNTHROID, LEVOTHROID) 50 MCG tablet Take 50 mcg by mouth 2 (two) times daily.     Yes Historical Provider, MD  omeprazole (PRILOSEC) 20 MG capsule Take 20 mg by mouth daily.     Yes Historical Provider, MD  potassium chloride SA (K-DUR,KLOR-CON) 20 MEQ tablet Take 20 mEq by mouth 3 (three) times daily.     Yes Historical Provider, MD  prednisoLONE 5 MG TABS Take 15 mg by mouth daily.   Yes Historical Provider, MD   Family History:  Family Status  Relation Status Death Age  . Mother Deceased 4    died of renal failure  . Father Deceased 57    died of heart trouble  . Sister Alive     history of CAD and CABG  . Brother Alive     history of CAD and CABG  . Brother Alive     history of CAD and CABG  . Sister Deceased 8    died of COPD  . Daughter Alive   . Sister Deceased 38    died of auto accident  . Sister Alive    Social History:   reports that he has never smoked. He does not have any smokeless tobacco history on file. He reports that he does not drink alcohol or use illicit drugs.   History   Social History Narrative   One daughter.  Retired Estate manager/land agent for [employer]     Review of Systems: His weight has been stable. He wears glasses. He has had difficulty with his lower trauma knees and difficulty walking and was placed on prednisone recently. Because of this his diabetes has been under poor control. He has significant urinary frequency. He does not have any digestive problems at the present time although he has a remote history of gastritis as well as GI bleeding. He has no history of stroke or TIA.  Other than as noted above, the remainder of the review of systems is normal  Physical Exam: BP 126/79  Pulse 100  Temp 98.3 F (36.8 C) (Oral)  Resp 18  SpO2 99% General appearance: alert, cooperative, appears stated age and no distress Head: Normocephalic, without  obvious abnormality, atraumatic, Balding male hair pattern Eyes: conjunctivae/corneas clear. PERRL, EOM's intact. Fundi benign. Neck: no adenopathy, no carotid bruit, no JVD and supple, symmetrical, trachea midline Lungs: clear to auscultation bilaterally and Increased AP diameter Heart: regular rate and rhythm, S1, S2 normal, no murmur, click, rub or gallop Abdomen: soft, non-tender; bowel sounds normal; no masses,  no organomegaly Rectal: deferred Extremities: Extremities somewhat discolored. Previous saphenous harvesting scars noted. 1+ edema is  noted. Pulses: 2+ and symmetric Skin: Skin color, texture, turgor normal. No rashes or lesions Neurologic: Grossly normal   Labs: CBC    Component Value Date/Time   WBC 9.1 07/07/2012 1546   RBC 4.03* 07/07/2012 1546   HGB 11.4* 07/07/2012 1546   HCT 34.9* 07/07/2012 1546   PLT 173 07/07/2012 1546   MCV 86.6 07/07/2012 1546   MCH 28.3 07/07/2012 1546   MCHC 32.7 07/07/2012 1546   RDW 18.3* 07/07/2012 1546   LYMPHSABS 1.7 07/07/2012 1546   MONOABS 0.8 07/07/2012 1546   EOSABS 0.1 07/07/2012 1546   BASOSABS 0.0 07/07/2012 1546   CMP     Component Value Date/Time   NA 136 07/07/2012 1546   K 4.8 07/07/2012 1546   CL 100 07/07/2012 1546   CO2 21 07/07/2012 1546   GLUCOSE 127* 07/07/2012 1546   BUN 68* 07/07/2012 1546   CREATININE 2.11* 07/07/2012 1546   CALCIUM 9.3 07/07/2012 1546   PROT 7.5 07/07/2012 1546   ALBUMIN 3.1* 07/07/2012 1546   AST 22 07/07/2012 1546   ALT 19 07/07/2012 1546   ALKPHOS 94 07/07/2012 1546   BILITOT 0.4 07/07/2012 1546   GFRNONAA 29* 07/07/2012 1546   GFRAA 34* 07/07/2012 1546   BNP (last 3 results)  Basename 07/07/12 1547  PROBNP 13805.0*   Cardiac Panel (last 3 results)  Basename 07/07/12 1644 07/07/12 1547  CKTOTAL -- 82  CKMB -- 5.3*  TROPONINI <0.30 --  RELINDX -- RELATIVE INDEX IS INVALID     EKG: Appears initially to have an AV paced rhythm with premature ventricular complexes suggestive of either  retrograde sensing of P waves or atrial fibrillation. The rhythm strips appear to show atrial fibrillation alternating with paced beats.  Radiology: Cardiomegaly without overt congestion   IMPRESSIONS: 1. Acute on chronic diastolic congestive heart failure 2. New-onset of probable atrial fibrillation. The question is whether he is having pacemaker mediated tachycardia or atrial arrhythmias 3. Stage IV chronic kidney disease with previous history of renal artery stent 4. Coronary artery disease with previous redo bypass grafting 5. Insulin-dependent diabetes mellitus with renal and vascular complications 6. Hypertensive heart disease 7. Hyperlipidemia 8. Prednisone treatment for undefined illness questionable polymyalgia 9. HIV positive under treatment  PLAN: Admit to telemetry. Placed on intravenous heparin. Obtain echocardiogram. He will need to have full interrogation of his pacemaker in the morning. Intravenous furosemide because of fluid overload.  Signed: Darden Palmer MD [facility]  07/07/2012, 7:43 PM

## 2012-07-07 NOTE — ED Notes (Signed)
[employer] called regarding transmission. Pt has  dual chamber with rate set to 60-130. Total of 25 days atrial arhythmia 122 episodes since Jan 7th increasing in intensity since June. Atrial rate has been greater 400. July 9th longest episode. Transmission also shows PVC, 170 runs. Including today at 1130 unable to confirm authenticy.  MD made aware

## 2012-07-07 NOTE — ED Notes (Signed)
MD at bedside. 

## 2012-07-08 ENCOUNTER — Encounter (HOSPITAL_COMMUNITY): Payer: Self-pay | Admitting: Internal Medicine

## 2012-07-08 DIAGNOSIS — I369 Nonrheumatic tricuspid valve disorder, unspecified: Secondary | ICD-10-CM

## 2012-07-08 DIAGNOSIS — I4891 Unspecified atrial fibrillation: Principal | ICD-10-CM

## 2012-07-08 LAB — CBC
Hemoglobin: 10.9 g/dL — ABNORMAL LOW (ref 13.0–17.0)
MCH: 28.7 pg (ref 26.0–34.0)
MCHC: 33 g/dL (ref 30.0–36.0)
MCV: 86.8 fL (ref 78.0–100.0)
Platelets: 156 10*3/uL (ref 150–400)
RBC: 3.8 MIL/uL — ABNORMAL LOW (ref 4.22–5.81)

## 2012-07-08 LAB — BASIC METABOLIC PANEL
BUN: 66 mg/dL — ABNORMAL HIGH (ref 6–23)
Calcium: 9.2 mg/dL (ref 8.4–10.5)
Chloride: 103 mEq/L (ref 96–112)
Creatinine, Ser: 1.95 mg/dL — ABNORMAL HIGH (ref 0.50–1.35)
GFR calc Af Amer: 37 mL/min — ABNORMAL LOW (ref >90)

## 2012-07-08 LAB — HEMOGLOBIN A1C: Hgb A1c MFr Bld: 6.9 % — ABNORMAL HIGH (ref <5.7)

## 2012-07-08 LAB — GLUCOSE, CAPILLARY
Glucose-Capillary: 130 mg/dL — ABNORMAL HIGH (ref 70–99)
Glucose-Capillary: 35 mg/dL — CL (ref 70–99)

## 2012-07-08 LAB — HEPARIN LEVEL (UNFRACTIONATED): Heparin Unfractionated: 0.48 IU/mL (ref 0.30–0.70)

## 2012-07-08 LAB — TSH: TSH: 4.091 u[IU]/mL (ref 0.350–4.500)

## 2012-07-08 MED ORDER — GLUCOSE 40 % PO GEL: 1.0000 | Freq: Once | ORAL | Status: AC | PRN

## 2012-07-08 MED ORDER — FUROSEMIDE 80 MG PO TABS
80.0000 mg | ORAL_TABLET | Freq: Every day | ORAL | Status: DC
  Administered 2012-07-08 – 2012-07-09 (×2): 80 mg via ORAL
  Filled 2012-07-08 (×2): qty 1

## 2012-07-08 MED ORDER — FUROSEMIDE 40 MG PO TABS: 60.0000 mg | ORAL_TABLET | Freq: Two times a day (BID) | ORAL | Status: DC

## 2012-07-08 MED ORDER — GLUCAGON HCL (RDNA) 1 MG IJ SOLR: 0.5000 mg | Freq: Once | INTRAMUSCULAR | Status: AC

## 2012-07-08 MED ORDER — DEXTROSE 5 % IV SOLN: INTRAVENOUS | Status: DC

## 2012-07-08 MED ORDER — GLUCAGON HCL (RDNA) 1 MG IJ SOLR
1.0000 mg | Freq: Once | INTRAMUSCULAR | Status: AC
  Administered 2012-07-08: 1 mg via INTRAMUSCULAR

## 2012-07-08 MED ORDER — GLUCOSE 40 % PO GEL: 1.0000 | ORAL | Status: DC | PRN

## 2012-07-08 MED ORDER — GLUCOSE 40 % PO GEL
ORAL | Status: AC
  Administered 2012-07-08: 17:00:00
  Filled 2012-07-08: qty 1

## 2012-07-08 MED ORDER — DEXTROSE 50 % IV SOLN: 50.0000 mL | Freq: Once | INTRAVENOUS | Status: AC | PRN

## 2012-07-08 MED ORDER — GLUCAGON HCL (RDNA) 1 MG IJ SOLR
INTRAMUSCULAR | Status: AC
  Administered 2012-07-08: 17:00:00
  Filled 2012-07-08: qty 1

## 2012-07-08 MED ORDER — DEXTROSE 50 % IV SOLN
INTRAVENOUS | Status: AC
  Filled 2012-07-08: qty 50

## 2012-07-08 NOTE — Progress Notes (Signed)
CBG: 30  Treatment: Glucagon IM 1 mg  Symptoms: Pale, Sweaty and Shaky  Follow-up CBG: Time:1724 CBG Result:82  Possible Reasons for Event: Unknown  Comments/MD notified:PA Hope    Subrina Vecchiarelli, Brantley Stage

## 2012-07-08 NOTE — Progress Notes (Addendum)
Patient was to be discharged today, but before going home he developed severe hypoglycemia requiring IM glucagon administration for a blood sugar of 35. The patient was lethargic, but no loss of consciousness. His blood sugar returned to 82. During the day his blood sugar was 135 and he was given 25 units of insulin which is his home dose (20units) plus the Sliding scale dose. He denies having any similar hypoglycemic episodes at home. A1C is 6.9. Unsure if this was due to different eating habits at home compared to diet while hospitalized. After dinner his blood sugar was 203. The diabetes coordinator was involved in his care and recommended he be given 10 units insulin. He will continue to be monitored overnight.  [place], PA-C 07/08/2012 6:31 PM  [phone] pgr

## 2012-07-08 NOTE — Progress Notes (Signed)
Called by RN to help with patient whose CBG is 35 with no IV access.  Upon arrival RN at bedside administering glucose gel.  Patient is very lethargic, is able to be aroused, patient skin is clammy.  CBG rechecked 30, no IV access currently, 1 mg glucagon given by bedside RN.  MD paged, notified and at bedside.  Patient starting to come around and communicating.  Patient more awake and is now being fed dinner.  CBG rechecked is now 82.  Rn to call if assistance needed

## 2012-07-08 NOTE — Progress Notes (Signed)
Patient Name: [name] Proctor      SUBJECTIVE: admitted last pm With dyspnea and palpitations.  Ez neg  When i went to see this am, HR 130 which broke to 70 with application of magnet   Past Medical History  Diagnosis Date  . CHF (congestive heart failure)   . History of diastolic dysfunction   . SSS (sick sinus syndrome)   . Coronary artery disease   . History of atrial flutter   . Chronic renal insufficiency   . Diabetes mellitus type 2, insulin dependent   . History of GI bleed   . Gastritis   . Esophagitis   . Duodenal ulcer   . IDA (iron deficiency anemia)   . Hypertension   . Hyperlipidemia   . Hypothyroidism   . HIV positive   . RAS (renal artery stenosis)     PHYSICAL EXAM Filed Vitals:   07/07/12 1939 07/07/12 2025 07/07/12 2126 07/08/12 0410  BP:  104/82 126/70 121/83  Pulse:   105 127  Temp:   98.2 F (36.8 C) 98 F (36.7 C)  TempSrc:   Oral Oral  Resp:  23 20 20   Height:   6\' 2"  (1.88 m)   Weight:   166 lb 0.1 oz (75.3 kg) 164 lb 3.9 oz (74.5 kg)  SpO2: 98% 99% 98% 93%   Well developed and nourished in no acute distress HENT normal Neck supple with JVP-flat Clear occ wheeze  Regular rate and rhythm, no murmurs or gallops Abd-soft with active BS No Clubbing cyanosis edema Skin-warm and dry A & Oriented  Grossly normal sensory and motor function  TELEMETRY: Reviewed telemetry pt in .P-synchronous/ AV  Pacing at 130 terminated wthi magnet:    Intake/Output Summary (Last 24 hours) at 07/08/12 0821 Last data filed at 07/08/12 0600  Gross per 24 hour  Intake 543.95 ml  Output   2275 ml  Net -1731.05 ml    LABS: Basic Metabolic Panel:  Lab 07/08/12 4098 07/07/12 1946 07/07/12 1546  NA 142 137 136  K 3.6 4.3 4.8  CL 103 100 100  CO2 24 24 21   GLUCOSE 136* 162* 127*  BUN 66* 69* 68*  CREATININE 1.95* 2.05* 2.11*  CALCIUM 9.2 9.3 --  MG -- -- --  PHOS -- -- --   Cardiac Enzymes:  Basename 07/07/12 1644 07/07/12 1547  CKTOTAL --  82  CKMB -- 5.3*  CKMBINDEX -- --  TROPONINI <0.30 --   CBC:  Lab 07/08/12 0540 07/07/12 1546  WBC 7.7 9.1  NEUTROABS -- 6.6  HGB 10.9* 11.4*  HCT 33.0* 34.9*  MCV 86.8 86.6  PLT 156 173   PROTIME:  Basename 07/07/12 1946  LABPROT 14.9  INR 1.15   Liver Function Tests:  Basename 07/07/12 1946 07/07/12 1546  AST 17 22  ALT 16 19  ALKPHOS 92 94  BILITOT 0.4 0.4  PROT 7.1 7.5  ALBUMIN 3.0* 3.1*   No results found for this basename: LIPASE:2,AMYLASE:2 in the last 72 hours BNP: No components found with this basename: POCBNP:3 D-Dimer: No results found for this basename: DDIMER:2 in the last 72 hours Hemoglobin A1C:  Basename 07/07/12 1946  HGBA1C 6.9*   Fasting Lipid Panel: No results found for this basename: CHOL,HDL,LDLCALC,TRIG,CHOLHDL,LDLDIRECT in the last 72 hours Thyroid Function Tests:  Basename 07/07/12 1946  TSH 4.091  T4TOTAL --  T3FREE --  THYROIDAB --     Device Interrogation: pendning   ASSESSMENT AND PLAN:  Patient Active Hospital  Problem List: Pacemaker-mediated tachycardia (07/07/2012)   Acute on chronic diastolic congestive heart failure ()   Coronary artery disease ()   Cardiac pacemaker in situ ()   Hx of renal artery stenosis ()   PT with PMT responsive to magnet, and reproducing of symptoms  Will reprogram device and then discharge later today  Needs f/u PJ  Oct13  Discharge on lasix bid 60     Signed, Sherryl Manges MD  07/08/2012

## 2012-07-08 NOTE — Discharge Summary (Signed)
Discharge Summary   Patient ID: [name] Proctor MRN: [id number], DOB/AGE: 08-09-1935 77 y.o. Admit date: 07/07/2012 D/C date:     07/08/2012  [facility]/ Dr[facility] Swaziland  Primary Discharge Diagnoses:  1. Atrial fibrillation by pacemaker interrogation, device reprogrammed 2. Acute on chronic diastolic CHF - LV dysfunction by echo this admission with EF 45% 07/08/12 (not on ACEI due to renal dysfunction) 3. CAD s/p CABG 2010 4. Chronic kidney disease stage IV with prior hx of renal artery stent 5. SSS s/p PPM 2007 with gen change March 2013   Secondary Discharge Diagnoses:  1. HIV 2. Atrial flutter s/p ablation 3. DM with renal and vascular complications 4. Hx of GI bleeding 5. H/o esophagitis, gastritis, duodenal ulcer 6. IDA 7. HTN 8. HL 9. Hypothroidism 10. Renal artery stenosis  Hospital Course: This 77 year old male has a history of coronary artery disease s/p CABG, diastolic CHF, atrial arrhythmias, known HIV infection who presented to [facility] with sensation of irregular heart rate. He had an atrial flutter ablation done previously and had to have a generator change because of premature battery depletion in March of this year at the [place]. He has had a complex history recently. He was placed on prednisone about 2 weeks ago at the [facility] is of difficulty with his muscles and walking. He cannot give exact details of what is going on with that. He presented to the emergency room with a two-day history of worsening shortness of breath and a feeling as if his pulse was irregular and his heart was beating erratically. He had not had any chest discomfort suggestive of angina. CXR showed cardiomegaly without overt congestion. EKG showed AV paced rhythm with premature ventricular complexes suggestive of either retrograde sensing of P waves or atrial fibrillation. He was admitted to telemetry and placed on IV heparin for afib. IV Lasix was given because of fluid  overload/acute on chronic diastolic CHF. POC troponin was mildly elevated but followup full troponin was normal. When Dr. Graciela Husbands went to see him this morning, his HR was 130 which broke to 70 with application of magnet - initially this was felt to be pacemaker mediated tachycardia, but later the timing of magnet/conversion was only felt to be coincidence as his interrogation revealed this was instead atrial fibrillation and tracking of the atrial tachycardia. His device was reprogrammed with a change to his tracking rate. Dr. Graciela Husbands recommended to discharge on Lasix 60mg  BID, and [facility], appt with Dr. Swaziland in 09/2012. 2D echo revealed EF 45%. We will not start ACEI at present due to renal insufficiency. Dr. Graciela Husbands has seen and examined the patient today and feels he is stable for discharge. The patient will follow up with his family/[facility] to discuss anticoagulation - note prior h/o anemia, gastritis, ulcer.  Of note, pharmacy pointed out that he is on unusual doses of abacavir and etravirine - he was instructed to call his HIV physician to ensure he was on the correct doses.   Discharge Vitals: Blood pressure 137/82, pulse 62, temperature 98 F (36.7 C), temperature source Oral, resp. rate 19, height 6\' 2"  (1.88 m), weight 164 lb 3.9 oz (74.5 kg), SpO2 99.00%.  Labs: Lab Results  Component Value Date   WBC 7.7 07/08/2012   HGB 10.9* 07/08/2012   HCT 33.0* 07/08/2012   MCV 86.8 07/08/2012   PLT 156 07/08/2012     Lab 07/08/12 0540 07/07/12 1946  NA 142 --  K 3.6 --  CL 103 --  CO2 24 --  BUN 66* --  CREATININE 1.95* --  CALCIUM 9.2 --  PROT -- 7.1  BILITOT -- 0.4  ALKPHOS -- 92  ALT -- 16  AST -- 17  GLUCOSE 136* --    Basename 07/07/12 1644 07/07/12 1547  CKTOTAL -- 82  CKMB -- 5.3*  TROPONINI <0.30 --    Diagnostic Studies/Procedures   1. Chest 2 View 07/07/2012  *RADIOLOGY REPORT*  Clinical Data: Shortness of breath.  Diabetes and hypertension. Coronary  artery disease.  CHEST - 2 VIEW  Comparison: 07/10/2009  Findings: Mild cardiomegaly stable.  Both lungs are clear.  No evidence of pleural effusion.  No mass or lymphadenopathy identified.  Transvenous pacemaker leads remain in appropriate position.  Prior CABG again noted.  IMPRESSION: Stable cardiomegaly.  No active lung disease.  Original Report Authenticated By: Danae Orleans, M.D.   2. 2D Echo 07/08/12 Study Conclusions - Left ventricle: The cavity size was normal. Wall thickness was increased in a pattern of mild LVH. Indeterminant diastolic function. Septal-lateral dyssynchrony likely from pacing. The estimated ejection fraction was 45%. Diffuse hypokinesis. - Aortic valve: Trileaflet; mildly calcified leaflets. There was no stenosis. Mild regurgitation. - Aorta: Mildly dilated aortic root. Aortic root dimension 40mm (ED). - Mitral valve: Mildly to moderately calcified annulus. Mild regurgitation. - Left atrium: The atrium was moderately dilated. - Right ventricle: The cavity size was normal. Pacer wire or catheter noted in right ventricle. Systolic function was mildly reduced. - Right atrium: The atrium was moderately dilated. - Tricuspid valve: Mild-moderate regurgitation. - Pulmonary arteries: PA peak pressure: 55mm Hg (S). - Systemic veins: IVC measured 2.7 cm with some respirophasic variation, suggesting RA pressure 15 mmHg.  Impressions: - Normal LV size with mild LV hypertrophy. EF 45% with septal-lateral dyssynchrony likely from pacing. Normal RV size with mildly decreased systolic function. Indeterminant diastolic function. Mild MR and AI. Moderate pulmonary hypertension. Dilated IVC with evidence for elevated RV filling pressure.   Discharge Medications   Medication List  As of 07/08/2012  2:22 PM   STOP taking these medications         INSULIN ISOPHANE & REGULAR [place]         TAKE these medications         abacavir 300 MG tablet   Commonly known as: ZIAGEN   Take 300 mg  by mouth daily.      ALCOHOL PREPS EX   Apply topically.      aspirin 81 MG tablet   Take 81 mg by mouth daily.      atorvastatin 10 MG tablet   Commonly known as: LIPITOR   Take 10 mg by mouth daily.      calcium carbonate 1250 MG tablet   Commonly known as: OS-CAL - dosed in mg of elemental calcium   Take 2 tablets by mouth daily.      carvedilol 25 MG tablet   Commonly known as: COREG   Take 25 mg by mouth 2 (two) times daily with a meal.      etravirine 100 MG tablet   Commonly known as: INTELENCE   Take 200 mg by mouth 4 (four) times daily.      furosemide 40 MG tablet   Commonly known as: LASIX   Take 1.5 tablets (60 mg total) by mouth 2 (two) times daily.      insulin regular 100 units/mL injection   Commonly known as: NOVOLIN  R,HUMULIN R   Inject 20 Units into the skin 3 (three) times daily before meals.      lamiVUDine 150 MG tablet   Commonly known as: EPIVIR   Take 150 mg by mouth daily.      levothyroxine 50 MCG tablet   Commonly known as: SYNTHROID, LEVOTHROID   Take 50 mcg by mouth daily.      omeprazole 20 MG capsule   Commonly known as: PRILOSEC   Take 20 mg by mouth daily.      potassium chloride SA 20 MEQ tablet   Commonly known as: K-DUR,KLOR-CON   Take 20 mEq by mouth 3 (three) times daily.      predniSONE 5 MG tablet   Commonly known as: DELTASONE   Take 15 mg by mouth daily.            Disposition   The patient will be discharged in stable condition to home. Discharge Orders    Future Appointments: Provider: Department: Dept Phone: Center:   09/24/2012 1:45 PM Peter M Swaziland, MD [facility] [phone] None     Future Orders Please Complete By Expires   Diet - low sodium heart healthy      Increase activity slowly        Follow-up Information    Follow up with Peter Swaziland, MD on 09/24/2012. (At 1:45 PM)    Contact information:   [facility] [address] [place]  [address] [phone]       [facility] ([facility] to [facility] an appointment  to be seen within 1 week to discuss your heart issues/atrial fibrillation)       Follow up with HIV Doctor. (The pharmacists at this hospital pointed out that some of your doses for your HIV medicines are not typical doses, but all patients are different. Please call your HIV doctor today to make sure you are taking the right doses of abacavir and etravirine)            Duration of Discharge Encounter: Greater than 30 minutes including physician and PA time.  Signed, Dayna Dunn PA-C 07/08/2012, 2:22 PM

## 2012-07-08 NOTE — Progress Notes (Signed)
Gave 10u of Novolog not 20 d/t hypog epsiode. Per PA and DM cordtr

## 2012-07-08 NOTE — Progress Notes (Signed)
INITIAL ADULT NUTRITION ASSESSMENT Date: 07/08/2012   Time: 11:37 AM Reason for Assessment: Nutrition Risk   ASSESSMENT: Male 77 y.o.  Dx: Pacemaker-mediated tachycardia  Hx:  Past Medical History  Diagnosis Date  . CHF (congestive heart failure)   . History of diastolic dysfunction   . SSS (sick sinus syndrome)   . Coronary artery disease   . History of atrial flutter   . Chronic renal insufficiency   . Diabetes mellitus type 2, insulin dependent   . History of GI bleed   . Gastritis   . Esophagitis   . Duodenal ulcer   . IDA (iron deficiency anemia)   . Hypertension   . Hyperlipidemia   . Hypothyroidism   . HIV positive   . RAS (renal artery stenosis)   . pacemaker-MDT     Medtronic EnRhythm generator serial [id number] 2007 Dr. Reyes Ivan Replaced at [facility] 2012       Related Meds:     . abacavir  300 mg Oral Daily  . aspirin EC  81 mg Oral Daily  . atorvastatin  10 mg Oral q1800  . calcium carbonate  2 tablet Oral Daily  . carvedilol  25 mg Oral BID WC  . etravirine  200 mg Oral Q6H  . furosemide  80 mg Oral Daily  . heparin  4,000 Units Intravenous Once  . insulin aspart  0-20 Units Subcutaneous TID WC  . insulin aspart  0-5 Units Subcutaneous QHS  . insulin aspart  20 Units Subcutaneous TID WC  . lamiVUDine  150 mg Oral Daily  . levothyroxine  50 mcg Oral QAC breakfast  . pantoprazole  40 mg Oral Q1200  . potassium chloride SA  20 mEq Oral TID  . predniSONE  15 mg Oral Q breakfast  . sodium chloride  3 mL Intravenous Q12H  . DISCONTD: aspirin  81 mg Oral Daily  . DISCONTD: furosemide  80 mg Intravenous BID  . DISCONTD: insulin regular  20 Units Subcutaneous TID AC  . DISCONTD: levothyroxine  50 mcg Oral BID  . DISCONTD: prednisoLONE  15 mg Oral Daily     Ht: 6\' 2"  (188 cm)  Wt: 164 lb 3.9 oz (74.5 kg)  Ideal Wt: 86.4 kg % Ideal Wt: 86%  Usual Wt: 175-180 lbs per pt report Wt Readings from Last 10 Encounters:  07/08/12 164 lb 3.9 oz (74.5 kg)    10/04/11 172 lb (78.019 kg)    % Usual Wt: 94%  Body mass index is 21.09 kg/(m^2). WNL  Food/Nutrition Related Hx: Pt reports poor intake with weight loss PTA  Labs:  CMP     Component Value Date/Time   NA 142 07/08/2012 0540   K 3.6 07/08/2012 0540   CL 103 07/08/2012 0540   CO2 24 07/08/2012 0540   GLUCOSE 136* 07/08/2012 0540   BUN 66* 07/08/2012 0540   CREATININE 1.95* 07/08/2012 0540   CALCIUM 9.2 07/08/2012 0540   PROT 7.1 07/07/2012 1946   ALBUMIN 3.0* 07/07/2012 1946   AST 17 07/07/2012 1946   ALT 16 07/07/2012 1946   ALKPHOS 92 07/07/2012 1946   BILITOT 0.4 07/07/2012 1946   GFRNONAA 32* 07/08/2012 0540   GFRAA 37* 07/08/2012 0540     Intake/Output Summary (Last 24 hours) at 07/08/12 1139 Last data filed at 07/08/12 0841  Gross per 24 hour  Intake 903.95 ml  Output   2575 ml  Net -1671.05 ml      Diet Order: Carb Control  Supplements/Tube Feeding:  none  IVF:    heparin Last Rate: 1,100 Units/hr (07/07/12 2059)    Estimated Nutritional Needs:   Kcal: 1900-2100  Protein: 85-95 gm  Fluid:  1.9-2.1 L   Pt reports that for "awhile" he was not able to get to his food. Lost about 7-8 lbs per his report. Reports he is "better now" and is able to eat well. Says he has gained a few pounds back.  Currently pt has a good appetite, says he is eating well. Denies need for snacks or supplements. Pt now is 11 lbs (6.3%) below his stated UBW. Unclear in what time frame weight loss has been in.  PO intake this admission has been 100% x 1 meal.  Pt denies need for snacks or supplements.   NUTRITION DIAGNOSIS: -Increased nutrient needs (NI-5.1).  Status: Ongoing  RELATED TO: disease state  AS EVIDENCE BY: estimated nutrition needs   MONITORING/EVALUATION(Goals): Goal: PO intake to remains >75% completion  Monitor: Weight, labs, intake   EDUCATION NEEDS: -No education needs identified at this time  INTERVENTION: No interventions at this time, RD will continue to  follow   DOCUMENTATION CODES Per approved criteria  -Not Applicable   Clarene Duke RD, LDN Pager [phone] After Hours pager [phone]

## 2012-07-08 NOTE — Progress Notes (Signed)
  Echocardiogram 2D Echocardiogram has been performed.  Jorje Guild 07/08/2012, 9:23 AM

## 2012-07-08 NOTE — Progress Notes (Signed)
ANTICOAGULATION CONSULT NOTE - Follow-Up Consult  Pharmacy Consult for Heparin Indication: atrial fibrillation new onset  Allergies  Allergen Reactions  . Darvocet (Propoxyphene-Acetaminophen)   . Seldane (Terfenadine)   . Ultracet (Tramadol-Acetaminophen)     Patient Measurements: Height: 6\' 2"  (188 cm) Weight: 164 lb 3.9 oz (74.5 kg) IBW/kg (Calculated) : 82.2   Vital Signs: Temp: 98 F (36.7 C) (07/15 0410) Temp src: Oral (07/15 0410) BP: 121/83 mmHg (07/15 0410) Pulse Rate: 127  (07/15 0410)  Labs:  Basename 07/08/12 0540 07/07/12 1946 07/07/12 1644 07/07/12 1547 07/07/12 1546  HGB 10.9* -- -- -- 11.4*  HCT 33.0* -- -- -- 34.9*  PLT 156 -- -- -- 173  APTT -- 34 -- -- --  LABPROT -- 14.9 -- -- --  INR -- 1.15 -- -- --  HEPARINUNFRC 0.48 -- -- -- --  CREATININE -- 2.05* -- -- 2.11*  CKTOTAL -- -- -- 82 --  CKMB -- -- -- 5.3* --  TROPONINI -- -- <0.30 -- --    Estimated Creatinine Clearance: 32.8 ml/min (by C-G formula based on Cr of 2.05).   Assessment: 77yom admitted for SOB/palpatations.  New onset A-Fib in ED.  Heparin level therapeutic. No bleeding noted.  Goal of Therapy:  Heparin level 0.3-0.7 units/ml Monitor platelets by anticoagulation protocol: Yes   Plan:  Continue heparin drip rate 1100 uts/hr F/u daily heparin level and CBC  Christoper Fabian, PharmD, BCPS Clinical pharmacist, pager [phone] 07/08/2012,6:45 AM

## 2012-07-08 NOTE — Progress Notes (Signed)
Interrogation of his device demonstrates that I was wrong. Determination of his tachycardia with the magnet was serendipitous. He has no evidence of pacemaker mediated tachycardia but rather atrial fibrillation and tracking of the atrial atrial tachycardia.  We will reprogram his device to his nose which at a lower rate and to track to a lower rate.  He will followup with the family concerning anticoagulation and further intervention

## 2012-07-08 NOTE — Progress Notes (Signed)
CBG: 35  Treatment: 15 GM gel  Symptoms: Pale, Sweaty and Shaky  Follow-up CBG: Time:1706 CBG Result:30   Possible Reasons for Event: Unknown  Comments/MD notified: PA Hope     Joandry Slagter, Brantley Stage

## 2012-07-08 NOTE — Progress Notes (Signed)
Utilization review completed.  

## 2012-07-09 DIAGNOSIS — R000 Tachycardia, unspecified: Secondary | ICD-10-CM

## 2012-07-09 LAB — GLUCOSE, CAPILLARY: Glucose-Capillary: 83 mg/dL (ref 70–99)

## 2012-07-09 NOTE — Progress Notes (Signed)
Patient Name: [name] Proctor Date of Encounter: 07/09/2012  Principal Problem:  *Pacemaker-mediated tachycardia Active Problems:  Acute on chronic diastolic congestive heart failure  Coronary artery disease  pacemaker-MDT  Hx of renal artery stenosis    SUBJECTIVE: Pt feels much better this am. He ate salad last pm, not much protein and rec'd insulin as usual. He generally makes sure to get some protein and a few carbs per meal, but did not yesterday. He has never had CBG this low and assured me it will not happen again. Otherwise, he feels well and ready to go.   OBJECTIVE Filed Vitals:   07/08/12 1150 07/08/12 1807 07/08/12 2039 07/09/12 0806  BP: 137/82 122/70 109/60 137/70  Pulse: 62 72 60 71  Temp: 98 F (36.7 C) 98 F (36.7 C) 97.4 F (36.3 C) 97.3 F (36.3 C)  TempSrc: Oral  Oral Oral  Resp: 19 18 18 18   Height:      Weight:    163 lb 5.8 oz (74.1 kg)  SpO2: 99% 99% 100% 99%    Intake/Output Summary (Last 24 hours) at 07/09/12 1125 Last data filed at 07/09/12 1054  Gross per 24 hour  Intake   1563 ml  Output   2150 ml  Net   -587 ml   Weight change:  Filed Weights   07/07/12 2126 07/08/12 0410 07/09/12 0806  Weight: 166 lb 0.1 oz (75.3 kg) 164 lb 3.9 oz (74.5 kg) 163 lb 5.8 oz (74.1 kg)     PHYSICAL EXAM General: Well developed, well nourished, male in no acute distress. Head: Normocephalic, atraumatic.  Neck: Supple without bruits, JVD with minimal elevation. Lungs:  Resp regular and unlabored, CTA bilaterally. Heart: RRR, S1, S2, no S3, S4, or murmur. Abdomen: Soft, non-tender, non-distended, BS + x 4.  Extremities: No clubbing, cyanosis, no edema.  Neuro: Alert and oriented X 3. Moves all extremities spontaneously. Psych: Normal affect.  LABS: CBC: Basename 07/08/12 0540 07/07/12 1546  WBC 7.7 9.1  NEUTROABS -- 6.6  HGB 10.9* 11.4*  HCT 33.0* 34.9*  MCV 86.8 86.6  PLT 156 173   INR: Basename 07/07/12 1946  INR 1.15   Basic Metabolic  Panel: Basename 07/08/12 0540 07/07/12 1946  NA 142 137  K 3.6 4.3  CL 103 100  CO2 24 24  GLUCOSE 136* 162*  BUN 66* 69*  CREATININE 1.95* 2.05*  CALCIUM 9.2 9.3  MG -- --  PHOS -- --   Liver Function Tests: Basename 07/07/12 1946 07/07/12 1546  AST 17 22  ALT 16 19  ALKPHOS 92 94  BILITOT 0.4 0.4  PROT 7.1 7.5  ALBUMIN 3.0* 3.1*   Cardiac Enzymes: Basename 07/07/12 1644 07/07/12 1547  CKTOTAL -- 82  CKMB -- 5.3*  CKMBINDEX -- --  TROPONINI <0.30 --    Basename 07/07/12 1607  TROPIPOC 0.17*   BNP: Pro B Natriuretic peptide (BNP)  Date/Time Value Range Status  07/07/2012  3:47 PM 13805.0* 0 - 450 pg/mL Final  07/11/2009  3:54 AM 2999.0* 0.0 - 100.0 pg/mL Final   Hemoglobin A1C: Basename 07/07/12 1946  HGBA1C 6.9*   Thyroid Function Tests: Basename 07/07/12 1946  TSH 4.091  T4TOTAL --  T3FREE --  THYROIDAB --   TELE:   V pacing +/- A pacing  Radiology/Studies: Dg Chest 2 View 07/07/2012  *RADIOLOGY REPORT*  Clinical Data: Shortness of breath.  Diabetes and hypertension. Coronary artery disease.  CHEST - 2 VIEW  Comparison: 07/10/2009  Findings: Mild  cardiomegaly stable.  Both lungs are clear.  No evidence of pleural effusion.  No mass or lymphadenopathy identified.  Transvenous pacemaker leads remain in appropriate position.  Prior CABG again noted.  IMPRESSION: Stable cardiomegaly.  No active lung disease.  Original Report Authenticated By: Danae Orleans, M.D.    Current Medications:     . abacavir  300 mg Oral Daily  . aspirin EC  81 mg Oral Daily  . atorvastatin  10 mg Oral q1800  . calcium carbonate  2 tablet Oral Daily  . carvedilol  25 mg Oral BID WC  . dextrose      . etravirine  200 mg Oral Q6H  . furosemide  80 mg Oral Daily  . glucagon      . glucagon  1 mg Intramuscular Once   Or  . glucagon  0.5 mg Intramuscular Once  . insulin aspart  0-20 Units Subcutaneous TID WC  . insulin aspart  0-5 Units Subcutaneous QHS  . insulin aspart  20  Units Subcutaneous TID WC  . lamiVUDine  150 mg Oral Daily  . levothyroxine  50 mcg Oral QAC breakfast  . pantoprazole  40 mg Oral Q1200  . potassium chloride SA  20 mEq Oral TID  . predniSONE  15 mg Oral Q breakfast  . sodium chloride  3 mL Intravenous Q12H      . dextrose    . DISCONTD: heparin 1,100 Units/hr (07/07/12 2059)    ASSESSMENT AND PLAN: Hypoglycemia - Pt required glucagon and oral glucose gel for CBG 35. He did not completely lose consciousness, but was held overnight to make sure there were no complications. Today, he is otherwise doing well and his CBGs have stabilized. He is OK for discharge today with no further med changes and no change in follow up.    Otherwise, see 7/15 note and d/c summary. Principal Problem:  *Pacemaker-mediated tachycardia Active Problems:  Acute on chronic diastolic congestive heart failure  Coronary artery disease  pacemaker-MDT  Hx of renal artery stenosis   Signed, Theodore Demark , PA-C 11:25 AM 07/09/2012

## 2012-09-24 ENCOUNTER — Encounter: Payer: Medicare Other | Admitting: Cardiology

## 2013-08-11 ENCOUNTER — Encounter (HOSPITAL_COMMUNITY): Payer: Self-pay | Admitting: Internal Medicine

## 2013-08-11 ENCOUNTER — Inpatient Hospital Stay (HOSPITAL_COMMUNITY)
Admission: EM | Admit: 2013-08-11 | Discharge: 2013-08-15 | DRG: 469 | Disposition: A | Discharge status: Skilled Nursing Facility | Payer: Medicare Other | Attending: Internal Medicine | Admitting: Internal Medicine

## 2013-08-11 ENCOUNTER — Emergency Department (HOSPITAL_COMMUNITY): Payer: Medicare Other

## 2013-08-11 DIAGNOSIS — D509 Iron deficiency anemia, unspecified: Secondary | ICD-10-CM | POA: Diagnosis present

## 2013-08-11 DIAGNOSIS — M797 Fibromyalgia: Secondary | ICD-10-CM | POA: Diagnosis present

## 2013-08-11 DIAGNOSIS — E1159 Type 2 diabetes mellitus with other circulatory complications: Secondary | ICD-10-CM | POA: Diagnosis present

## 2013-08-11 DIAGNOSIS — Z951 Presence of aortocoronary bypass graft: Secondary | ICD-10-CM

## 2013-08-11 DIAGNOSIS — S72001A Fracture of unspecified part of neck of right femur, initial encounter for closed fracture: Secondary | ICD-10-CM

## 2013-08-11 DIAGNOSIS — IMO0001 Reserved for inherently not codable concepts without codable children: Secondary | ICD-10-CM | POA: Diagnosis present

## 2013-08-11 DIAGNOSIS — Z9861 Coronary angioplasty status: Secondary | ICD-10-CM

## 2013-08-11 DIAGNOSIS — Z950 Presence of cardiac pacemaker: Secondary | ICD-10-CM

## 2013-08-11 DIAGNOSIS — Z7901 Long term (current) use of anticoagulants: Secondary | ICD-10-CM

## 2013-08-11 DIAGNOSIS — I798 Other disorders of arteries, arterioles and capillaries in diseases classified elsewhere: Secondary | ICD-10-CM

## 2013-08-11 DIAGNOSIS — I499 Cardiac arrhythmia, unspecified: Secondary | ICD-10-CM

## 2013-08-11 DIAGNOSIS — I5033 Acute on chronic diastolic (congestive) heart failure: Secondary | ICD-10-CM

## 2013-08-11 DIAGNOSIS — N269 Renal sclerosis, unspecified: Secondary | ICD-10-CM | POA: Diagnosis present

## 2013-08-11 DIAGNOSIS — I495 Sick sinus syndrome: Secondary | ICD-10-CM | POA: Diagnosis present

## 2013-08-11 DIAGNOSIS — D649 Anemia, unspecified: Secondary | ICD-10-CM

## 2013-08-11 DIAGNOSIS — E1169 Type 2 diabetes mellitus with other specified complication: Secondary | ICD-10-CM | POA: Diagnosis present

## 2013-08-11 DIAGNOSIS — I509 Heart failure, unspecified: Secondary | ICD-10-CM | POA: Diagnosis present

## 2013-08-11 DIAGNOSIS — I5032 Chronic diastolic (congestive) heart failure: Secondary | ICD-10-CM | POA: Diagnosis present

## 2013-08-11 DIAGNOSIS — E785 Hyperlipidemia, unspecified: Secondary | ICD-10-CM

## 2013-08-11 DIAGNOSIS — W108XXA Fall (on) (from) other stairs and steps, initial encounter: Secondary | ICD-10-CM | POA: Diagnosis present

## 2013-08-11 DIAGNOSIS — S72002Q Fracture of unspecified part of neck of left femur, subsequent encounter for open fracture type I or II with malunion: Secondary | ICD-10-CM

## 2013-08-11 DIAGNOSIS — Z8679 Personal history of other diseases of the circulatory system: Secondary | ICD-10-CM

## 2013-08-11 DIAGNOSIS — E039 Hypothyroidism, unspecified: Secondary | ICD-10-CM

## 2013-08-11 DIAGNOSIS — N184 Chronic kidney disease, stage 4 (severe): Secondary | ICD-10-CM | POA: Diagnosis present

## 2013-08-11 DIAGNOSIS — S72009A Fracture of unspecified part of neck of unspecified femur, initial encounter for closed fracture: Principal | ICD-10-CM | POA: Diagnosis present

## 2013-08-11 DIAGNOSIS — I4892 Unspecified atrial flutter: Secondary | ICD-10-CM

## 2013-08-11 DIAGNOSIS — I2510 Atherosclerotic heart disease of native coronary artery without angina pectoris: Secondary | ICD-10-CM | POA: Diagnosis present

## 2013-08-11 DIAGNOSIS — I129 Hypertensive chronic kidney disease with stage 1 through stage 4 chronic kidney disease, or unspecified chronic kidney disease: Secondary | ICD-10-CM | POA: Diagnosis present

## 2013-08-11 DIAGNOSIS — B20 Human immunodeficiency virus [HIV] disease: Secondary | ICD-10-CM

## 2013-08-11 DIAGNOSIS — I119 Hypertensive heart disease without heart failure: Secondary | ICD-10-CM

## 2013-08-11 LAB — CBC WITH DIFFERENTIAL/PLATELET
Eosinophils Relative: 0 % (ref 0–5)
HCT: 29.4 % — ABNORMAL LOW (ref 39.0–52.0)
Hemoglobin: 9.6 g/dL — ABNORMAL LOW (ref 13.0–17.0)
Lymphocytes Relative: 19 % (ref 12–46)
Lymphs Abs: 1.7 10*3/uL (ref 0.7–4.0)
MCH: 27.9 pg (ref 26.0–34.0)
MCV: 85.5 fL (ref 78.0–100.0)
Monocytes Absolute: 0.6 10*3/uL (ref 0.1–1.0)
Monocytes Relative: 7 % (ref 3–12)
RBC: 3.44 MIL/uL — ABNORMAL LOW (ref 4.22–5.81)
WBC: 8.6 10*3/uL (ref 4.0–10.5)

## 2013-08-11 LAB — URINALYSIS, ROUTINE W REFLEX MICROSCOPIC
Bilirubin Urine: NEGATIVE
Glucose, UA: NEGATIVE mg/dL
Hgb urine dipstick: NEGATIVE
Specific Gravity, Urine: 1.015 (ref 1.005–1.030)
Urobilinogen, UA: 1 mg/dL (ref 0.0–1.0)
pH: 5.5 (ref 5.0–8.0)

## 2013-08-11 LAB — BASIC METABOLIC PANEL
CO2: 24 mEq/L (ref 19–32)
Chloride: 102 mEq/L (ref 96–112)
Glucose, Bld: 49 mg/dL — ABNORMAL LOW (ref 70–99)
Sodium: 137 mEq/L (ref 135–145)

## 2013-08-11 LAB — GLUCOSE, CAPILLARY

## 2013-08-11 LAB — PROTIME-INR: Prothrombin Time: 29.5 seconds — ABNORMAL HIGH (ref 11.6–15.2)

## 2013-08-11 LAB — URINE MICROSCOPIC-ADD ON

## 2013-08-11 MED ORDER — CARVEDILOL 25 MG PO TABS
25.0000 mg | ORAL_TABLET | Freq: Two times a day (BID) | ORAL | Status: DC
  Administered 2013-08-12 – 2013-08-15 (×6): 25 mg via ORAL
  Filled 2013-08-11 (×9): qty 1

## 2013-08-11 MED ORDER — DEXTROSE 50 % IV SOLN
1.0000 | Freq: Once | INTRAVENOUS | Status: AC
  Administered 2013-08-11: 50 mL via INTRAVENOUS
  Filled 2013-08-11: qty 50

## 2013-08-11 MED ORDER — LEVOTHYROXINE SODIUM 50 MCG PO TABS
50.0000 ug | ORAL_TABLET | Freq: Every day | ORAL | Status: DC
  Administered 2013-08-12 – 2013-08-15 (×3): 50 ug via ORAL
  Filled 2013-08-11 (×5): qty 1

## 2013-08-11 MED ORDER — PANTOPRAZOLE SODIUM 40 MG PO TBEC
40.0000 mg | DELAYED_RELEASE_TABLET | Freq: Every day | ORAL | Status: DC
  Administered 2013-08-12: 40 mg via ORAL
  Filled 2013-08-11: qty 1

## 2013-08-11 MED ORDER — HYDROCODONE-ACETAMINOPHEN 5-325 MG PO TABS
1.0000 | ORAL_TABLET | Freq: Four times a day (QID) | ORAL | Status: DC | PRN
  Administered 2013-08-12 – 2013-08-15 (×3): 1 via ORAL
  Filled 2013-08-11 (×3): qty 1

## 2013-08-11 MED ORDER — MORPHINE SULFATE 2 MG/ML IJ SOLN
0.5000 mg | INTRAMUSCULAR | Status: DC | PRN
  Administered 2013-08-12: 0.5 mg via INTRAVENOUS
  Filled 2013-08-11: qty 1

## 2013-08-11 MED ORDER — DEXTROSE 50 % IV SOLN
50.0000 mL | Freq: Once | INTRAVENOUS | Status: AC
  Administered 2013-08-11: 50 mL via INTRAVENOUS
  Filled 2013-08-11: qty 50

## 2013-08-11 MED ORDER — HYDROCORTISONE SOD SUCCINATE 100 MG IJ SOLR
50.0000 mg | Freq: Three times a day (TID) | INTRAMUSCULAR | Status: DC
  Administered 2013-08-12 (×3): 50 mg via INTRAVENOUS
  Filled 2013-08-11 (×5): qty 1

## 2013-08-11 MED ORDER — DEXTROSE 5 % IV SOLN
5.0000 mg | Freq: Once | INTRAVENOUS | Status: AC
  Administered 2013-08-12: 5 mg via INTRAVENOUS
  Filled 2013-08-11: qty 0.5

## 2013-08-11 MED ORDER — ONDANSETRON HCL 4 MG/2ML IJ SOLN
4.0000 mg | Freq: Once | INTRAMUSCULAR | Status: AC
  Administered 2013-08-11: 4 mg via INTRAVENOUS
  Filled 2013-08-11: qty 2

## 2013-08-11 MED ORDER — DEXTROSE 5 % IV SOLN
INTRAVENOUS | Status: DC
  Administered 2013-08-11: 22:00:00 via INTRAVENOUS

## 2013-08-11 MED ORDER — ATORVASTATIN CALCIUM 10 MG PO TABS
10.0000 mg | ORAL_TABLET | Freq: Every day | ORAL | Status: DC
  Administered 2013-08-12 – 2013-08-15 (×3): 10 mg via ORAL
  Filled 2013-08-11 (×4): qty 1

## 2013-08-11 MED ORDER — FENTANYL CITRATE 0.05 MG/ML IJ SOLN
50.0000 ug | INTRAMUSCULAR | Status: AC | PRN
  Administered 2013-08-11 (×2): 50 ug via INTRAVENOUS
  Filled 2013-08-11 (×2): qty 2

## 2013-08-11 MED ORDER — ABACAVIR SULFATE 300 MG PO TABS
300.0000 mg | ORAL_TABLET | Freq: Every day | ORAL | Status: DC
  Administered 2013-08-12: 300 mg via ORAL
  Filled 2013-08-11: qty 1

## 2013-08-11 MED ORDER — INSULIN ASPART 100 UNIT/ML ~~LOC~~ SOLN
0.0000 [IU] | Freq: Three times a day (TID) | SUBCUTANEOUS | Status: DC
  Administered 2013-08-12: 2 [IU] via SUBCUTANEOUS
  Administered 2013-08-13: 3 [IU] via SUBCUTANEOUS
  Administered 2013-08-14: 5 [IU] via SUBCUTANEOUS
  Administered 2013-08-14: 7 [IU] via SUBCUTANEOUS

## 2013-08-11 MED ORDER — ETRAVIRINE 100 MG PO TABS
200.0000 mg | ORAL_TABLET | Freq: Four times a day (QID) | ORAL | Status: DC
  Administered 2013-08-12 (×2): 200 mg via ORAL
  Filled 2013-08-11 (×6): qty 2

## 2013-08-11 MED ORDER — LAMIVUDINE 150 MG PO TABS
150.0000 mg | ORAL_TABLET | Freq: Every day | ORAL | Status: DC
  Administered 2013-08-12 – 2013-08-15 (×3): 150 mg via ORAL
  Filled 2013-08-11 (×4): qty 1

## 2013-08-11 MED ORDER — CALCIUM CARBONATE 1250 (500 CA) MG PO TABS
1.0000 | ORAL_TABLET | Freq: Two times a day (BID) | ORAL | Status: DC
  Administered 2013-08-12 – 2013-08-15 (×6): 500 mg via ORAL
  Filled 2013-08-11 (×9): qty 1

## 2013-08-11 MED ORDER — ASPIRIN 81 MG PO CHEW
81.0000 mg | CHEWABLE_TABLET | Freq: Every day | ORAL | Status: DC
  Administered 2013-08-12: 81 mg via ORAL
  Filled 2013-08-11: qty 1

## 2013-08-11 NOTE — ED Provider Notes (Signed)
Spoke with the hospitalist for medical admission, and clearing prior to orthopedic surgery 6.  It to the patient to telemetry bed, requesting EKG, to  previous studies, and labs  Arman Filter, NP 08/11/13 2025

## 2013-08-11 NOTE — ED Provider Notes (Addendum)
CSN: [id number]     Arrival date & time 08/11/13  1825 History     First MD Initiated Contact with Patient 08/11/13 1826     Chief Complaint  Patient presents with  . Fall   (Consider location/radiation/quality/duration/timing/severity/associated sxs/prior Treatment) Patient is a 78 y.o. male presenting with fall. The history is provided by the patient and the EMS personnel.  Fall This is a new problem. The current episode started 1 to 2 hours ago. The problem occurs constantly. The problem has not changed since onset.Associated symptoms comments: Was going down the steps at his house and lost his balance falling on his right hip and unable to get up.  He laid in grass for about 1 hour until EMS called.  Denies head injury, LOC, neck pain or other areas of pain.. Exacerbated by: movement of right hip. Relieved by: given fentanyl by EMS. Treatments tried: fentanyl. The treatment provided significant relief.    Past Medical History  Diagnosis Date  . CHF (congestive heart failure)     a. h/o diastolic dysfunction. b. EF 45% by echo 06/2012.  Marland Kitchen History of diastolic dysfunction   . SSS (sick sinus syndrome)   . Coronary artery disease     s/p CABG 2010  . History of atrial flutter     s/p ablation  . Chronic renal insufficiency   . Diabetes mellitus type 2, insulin dependent   . History of GI bleed   . Gastritis   . Esophagitis   . Duodenal ulcer   . IDA (iron deficiency anemia)   . Hypertension   . Hyperlipidemia   . Hypothyroidism   . HIV positive   . RAS (renal artery stenosis)   . pacemaker-MDT     Medtronic EnRhythm generator serial [id number] 2007 Dr. Reyes Ivan. Replaced at [facility] 2012     . Atrial fibrillation     By pacemaker interrogation 06/2012 - device reprogrammed    Past Surgical History  Procedure Laterality Date  . Cardiac catheterization  02/22/2009    EF 40-45%  . Coronary angioplasty  03/06/2006    INTRACORONARY STENTING OF SAPHENOUS VEIN GRAFT TO THE PDA AND  INTRACORONARY STENTING OF THE SAPHENOUS VEIN GRAFT TO THE DIAGONAL  . Insert / replace / remove pacemaker      IMPLANT  . Ablation saphenous vein w/ rfa    . Renal artery stent      LEFT  . Coronary artery bypass graft  02/2009    REDO. NEW SAPHENOUS VEIN GRAFT TO THE OBTUSE MARGINAL VESSEL AND A SEQUENTIAL SAPHENOUS VEIN GRAFT TO THE PDA AND POSTERIOR LATERAL BRANCHES OF THE RIGHT CORONARY. THE LIMA GRAFT FROM HIS ORIGINAL SURGERY WAS STILL PATENT.  Marland Kitchen Pacemaker generator change  3/12    done at [facility] History  Problem Relation Age of Onset  . Kidney disease Mother   . Hypertension Father   . Crohn's disease Daughter   . Lupus Sister    History  Substance Use Topics  . Smoking status: Never Smoker   . Smokeless tobacco: Not on file  . Alcohol Use: No    Review of Systems  Neurological: Negative for weakness and numbness.  All other systems reviewed and are negative.    Allergies  Darvocet; Seldane; and Ultracet  Home Medications   Current Outpatient Rx  Name  Route  Sig  Dispense  Refill  . abacavir (ZIAGEN) 300 MG tablet   Oral   Take 300 mg by mouth daily.           Marland Kitchen  aspirin 81 MG tablet   Oral   Take 81 mg by mouth daily.           Marland Kitchen atorvastatin (LIPITOR) 10 MG tablet   Oral   Take 10 mg by mouth daily.           . calcium carbonate (OS-CAL - DOSED IN MG OF ELEMENTAL CALCIUM) 1250 MG tablet   Oral   Take 2 tablets by mouth daily.         . carvedilol (COREG) 25 MG tablet   Oral   Take 25 mg by mouth 2 (two) times daily with a meal.           . etravirine (INTELENCE) 100 MG tablet   Oral   Take 200 mg by mouth 4 (four) times daily.           . furosemide (LASIX) 40 MG tablet   Oral   Take 1.5 tablets (60 mg total) by mouth 2 (two) times daily.         . insulin regular (NOVOLIN R,HUMULIN R) 100 units/mL injection   Subcutaneous   Inject 20 Units into the skin 3 (three) times daily before meals.         . Isopropyl Alcohol  (ALCOHOL PREPS EX)   Apply externally   Apply topically.           Marland Kitchen lamiVUDine (EPIVIR) 150 MG tablet   Oral   Take 150 mg by mouth daily.         Marland Kitchen levothyroxine (SYNTHROID, LEVOTHROID) 50 MCG tablet   Oral   Take 50 mcg by mouth daily.         Marland Kitchen omeprazole (PRILOSEC) 20 MG capsule   Oral   Take 20 mg by mouth daily.           . potassium chloride SA (K-DUR,KLOR-CON) 20 MEQ tablet   Oral   Take 20 mEq by mouth 3 (three) times daily.           . predniSONE (DELTASONE) 5 MG tablet   Oral   Take 15 mg by mouth daily.          There were no vitals taken for this visit. Physical Exam  Nursing note and vitals reviewed. Constitutional: He is oriented to person, place, and time. He appears well-developed and well-nourished. No distress.  HENT:  Head: Normocephalic and atraumatic.  Mouth/Throat: Oropharynx is clear and moist.  Eyes: Conjunctivae and EOM are normal. Pupils are equal, round, and reactive to light.  Neck: Normal range of motion. Neck supple.  Cardiovascular: Normal rate, regular rhythm and intact distal pulses.   No murmur heard. Pulmonary/Chest: Effort normal and breath sounds normal. No respiratory distress. He has no wheezes. He has no rales. He exhibits no tenderness.  Pacemaker present in left chest.  Well healed sternotomy scar  Abdominal: Soft. He exhibits no distension. There is no tenderness. There is no rebound and no guarding.  Musculoskeletal: Normal range of motion. He exhibits tenderness. He exhibits no edema.       Right hip: He exhibits bony tenderness and deformity.  Signs of peripheral vascular disease.  Faint palpable pulse in the right DP.  Foot is warm and normal sensation.  <2sec cap refill.  Right hip is externally rotated and shortened  Neurological: He is alert and oriented to person, place, and time.  Skin: Skin is warm and dry. No rash noted. No erythema.  Psychiatric: He has a normal mood  and affect. His behavior is normal.     ED Course   Procedures (including critical care time)  Labs Reviewed  CBC WITH DIFFERENTIAL - Abnormal; Notable for the following:    RBC 3.44 (*)    Hemoglobin 9.6 (*)    HCT 29.4 (*)    RDW 16.2 (*)    All other components within normal limits  PROTIME-INR - Abnormal; Notable for the following:    Prothrombin Time 29.5 (*)    INR 2.93 (*)    All other components within normal limits  BASIC METABOLIC PANEL  URINALYSIS, ROUTINE W REFLEX MICROSCOPIC  TYPE AND SCREEN   Dg Chest 1 View  08/11/2013   *RADIOLOGY REPORT*  Clinical Data: Fall, pain over  right  hip  CHEST - 1 VIEW  Comparison: Chest radiograph 07/07/2012  Findings: Left-sided pacemaker overlies enlarged cardiac silhouette.  No effusion, infiltrate, or pneumothorax.  Mild central venous congestion is present.  IMPRESSION: Cardiomegaly mild central venous congestion.   Original Report Authenticated By: Genevive Bi, M.D.   Dg Hip Complete Right  08/11/2013   *RADIOLOGY REPORT*  Clinical Data: Fall, right hip pain  RIGHT HIP - COMPLETE 2+ VIEW  Comparison: None.  Findings: There is an acute fracture of the right femoral neck with varus angulation.  The fractures is either basocervical or sub capital .  There is superior migration of the distal fracture fragment (greater trochanter).  IMPRESSION: Acute right femoral neck fracture.   Original Report Authenticated By: Genevive Bi, M.D.    Date: 08/11/2013  Rate: 62  Rhythm: atrial fibrillation with PVC  QRS Axis: normal  Intervals: normal  ST/T Wave abnormalities: nonspecific ST/T changes  Conduction Disutrbances:nonspecific intraventricular conduction delay  Narrative Interpretation:   Old EKG Reviewed: changes noted   1. Femoral neck fracture, right, closed, initial encounter     MDM   Patient with multiple medical problems who had a mechanical fall today landing on his right hip that is now shortened and rotated. Evidence on exam consistent with a right  hip fracture currently neurovascularly intact. Patient has had pain medicine and appears to be comfortable at this time. Hip fracture protocol started no other injury except for a small skin tear on the right elbow. He denies LOC head injury or neck pain.  Patient is a terrible surgical candidate he has a history of CHF, diastolic dysfunction, coronary artery disease, sick sinus syndrome, chronic renal insufficiency, diabetes, GI bleeding and HIV positive. Will definitely need medical admission for surgical clearance.  7:18 PM Plain films shows acute right femoral neck fracture.  Spoke with Dr. Margarita Rana who will consult on pt and wait to hear from medicine when pt can undergo surgery.  Gwyneth Sprout, MD 08/11/13 1931  Gwyneth Sprout, MD 08/11/13 1610

## 2013-08-11 NOTE — H&P (Addendum)
[facility] History and Physical  [name] Proctor ZOX:[id number] DOB: [birth year] DOA: 08/11/2013  Referring physician: ER physician. PCP: Pcp Not In System [facility].  Chief Complaint: Fall.  HPI: [name] Proctor is a 78 y.o. male history of CAD status post bypass(redo bypass in 2010), sick sinus syndrome status post pacemaker placement, atrial flutter status post ablation, iron deficiency anemia, chronic kidney disease, diabetes mellitus type 2, HIV presented to the ER because of fall. Patient denies having hit his head or loss consciousness. X-rays show right hip fracture and patient has been admitted for further management. Patient at this time denies any chest pain or shortness of breath nausea vomiting or any focal deficits.  Review of Systems: As presented in the history of presenting illness, rest negative.  Past Medical History  Diagnosis Date  . CHF (congestive heart failure)     a. h/o diastolic dysfunction. b. EF 45% by echo 06/2012.  Marland Kitchen History of diastolic dysfunction   . SSS (sick sinus syndrome)   . Coronary artery disease     s/p CABG 2010  . History of atrial flutter     s/p ablation  . Chronic renal insufficiency   . Diabetes mellitus type 2, insulin dependent   . History of GI bleed   . Gastritis   . Esophagitis   . Duodenal ulcer   . IDA (iron deficiency anemia)   . Hypertension   . Hyperlipidemia   . Hypothyroidism   . HIV positive   . RAS (renal artery stenosis)   . pacemaker-MDT     Medtronic EnRhythm generator serial [id number] H 2007 Dr. Reyes Ivan. Replaced at [facility] 2012     . Atrial fibrillation     By pacemaker interrogation 06/2012 - device reprogrammed    Past Surgical History  Procedure Laterality Date  . Cardiac catheterization  02/22/2009    EF 40-45%  . Coronary angioplasty  03/06/2006    INTRACORONARY STENTING OF SAPHENOUS VEIN GRAFT TO THE PDA AND INTRACORONARY STENTING OF THE SAPHENOUS VEIN GRAFT TO THE DIAGONAL  . Insert / replace /  remove pacemaker      IMPLANT  . Ablation saphenous vein w/ rfa    . Renal artery stent      LEFT  . Coronary artery bypass graft  02/2009    REDO. NEW SAPHENOUS VEIN GRAFT TO THE OBTUSE MARGINAL VESSEL AND A SEQUENTIAL SAPHENOUS VEIN GRAFT TO THE PDA AND POSTERIOR LATERAL BRANCHES OF THE RIGHT CORONARY. THE LIMA GRAFT FROM HIS ORIGINAL SURGERY WAS STILL PATENT.  Marland Kitchen Pacemaker generator change  3/12    done at [facility]   Social History:  reports that he has never smoked. He does not have any smokeless tobacco history on file. He reports that he does not drink alcohol or use illicit drugs. Home. where does patient live-- Can do ADLs. Can patient participate in ADLs?  Allergies  Allergen Reactions  . Darvocet [Propoxyphene-Acetaminophen] Nausea And Vomiting  . Seldane [Terfenadine] Nausea And Vomiting  . Sulfonylureas Other (See Comments)    Low grade fever  . Ultracet [Tramadol-Acetaminophen] Nausea And Vomiting    Family History  Problem Relation Age of Onset  . Kidney disease Mother   . Hypertension Father   . Crohn's disease Daughter   . Lupus Sister       Prior to Admission medications   Medication Sig Start Date End Date Taking? Authorizing Provider  abacavir (ZIAGEN) 300 MG tablet Take 300 mg by mouth daily.  Historical Provider, MD  aspirin 81 MG tablet Take 81 mg by mouth daily.      Historical Provider, MD  atorvastatin (LIPITOR) 10 MG tablet Take 10 mg by mouth daily.      Historical Provider, MD  calcium carbonate (OS-CAL - DOSED IN MG OF ELEMENTAL CALCIUM) 1250 MG tablet Take 2 tablets by mouth daily.    Historical Provider, MD  carvedilol (COREG) 25 MG tablet Take 25 mg by mouth 2 (two) times daily with a meal.      Historical Provider, MD  etravirine (INTELENCE) 100 MG tablet Take 200 mg by mouth 4 (four) times daily.      Historical Provider, MD  furosemide (LASIX) 40 MG tablet Take 1.5 tablets (60 mg total) by mouth 2 (two) times daily. 07/08/12   Dayna N Dunn, PA-C   insulin regular (NOVOLIN R,HUMULIN R) 100 units/mL injection Inject 20 Units into the skin 3 (three) times daily before meals.    Historical Provider, MD  Isopropyl Alcohol (ALCOHOL PREPS EX) Apply topically.      Historical Provider, MD  lamiVUDine (EPIVIR) 150 MG tablet Take 150 mg by mouth daily.    Historical Provider, MD  levothyroxine (SYNTHROID, LEVOTHROID) 50 MCG tablet Take 50 mcg by mouth daily.    Historical Provider, MD  omeprazole (PRILOSEC) 20 MG capsule Take 20 mg by mouth daily.      Historical Provider, MD  potassium chloride SA (K-DUR,KLOR-CON) 20 MEQ tablet Take 20 mEq by mouth 3 (three) times daily.      Historical Provider, MD  predniSONE (DELTASONE) 5 MG tablet Take 15 mg by mouth daily.    Historical Provider, MD   Physical Exam: Filed Vitals:   08/11/13 1945 08/11/13 2015 08/11/13 2030 08/11/13 2100  BP: 140/73 138/73 141/78 152/79  Pulse: 60 61 59 65  Temp:      TempSrc:  Oral    Resp: 17 20 20 22   SpO2: 96% 97% 98% 100%     General:  Well-developed well-nourished.  Eyes: Anicteric no pallor.  ENT: No discharge from ears eyes nose mouth.  Neck: No mass felt.  Cardiovascular: S1-S2 heard.  Respiratory: No rhonchi or crepitations.  Abdomen: Soft nontender bowel sounds present.  Skin: No rash.  Musculoskeletal: Pain on moving right hip.  Psychiatric: Appears normal.  Neurologic: Alert awake oriented to time place and person. Moves all extremities.  Labs on Admission:  Basic Metabolic Panel:  Recent Labs Lab 08/11/13 1852  NA 137  K 3.5  CL 102  CO2 24  GLUCOSE 49*  BUN 51*  CREATININE 1.66*  CALCIUM 9.0   Liver Function Tests: No results found for this basename: AST, ALT, ALKPHOS, BILITOT, PROT, ALBUMIN,  in the last 168 hours No results found for this basename: LIPASE, AMYLASE,  in the last 168 hours No results found for this basename: AMMONIA,  in the last 168 hours CBC:  Recent Labs Lab 08/11/13 1852  WBC 8.6  NEUTROABS 6.3   HGB 9.6*  HCT 29.4*  MCV 85.5  PLT 227   Cardiac Enzymes: No results found for this basename: CKTOTAL, CKMB, CKMBINDEX, TROPONINI,  in the last 168 hours  BNP (last 3 results) No results found for this basename: PROBNP,  in the last 8760 hours CBG: No results found for this basename: GLUCAP,  in the last 168 hours  Radiological Exams on Admission: Dg Chest 1 View  08/11/2013   *RADIOLOGY REPORT*  Clinical Data: Fall, pain over  right  hip  CHEST - 1 VIEW  Comparison: Chest radiograph 07/07/2012  Findings: Left-sided pacemaker overlies enlarged cardiac silhouette.  No effusion, infiltrate, or pneumothorax.  Mild central venous congestion is present.  IMPRESSION: Cardiomegaly mild central venous congestion.   Original Report Authenticated By: Genevive Bi, M.D.   Dg Hip Complete Right  08/11/2013   *RADIOLOGY REPORT*  Clinical Data: Fall, right hip pain  RIGHT HIP - COMPLETE 2+ VIEW  Comparison: None.  Findings: There is an acute fracture of the right femoral neck with varus angulation.  The fractures is either basocervical or sub capital .  There is superior migration of the distal fracture fragment (greater trochanter).  IMPRESSION: Acute right femoral neck fracture.   Original Report Authenticated By: Genevive Bi, M.D.    EKG: Independently reviewed. Atrial fibrillation with rate controlled.  Assessment/Plan Principal Problem:   Femoral neck fracture Active Problems:   SSS (sick sinus syndrome)   Coronary artery disease   Chronic kidney disease stage 4   Type 2 diabetes mellitus with vascular disease   HIV disease   Anemia   1. Acute right femoral neck fracture - patient at this time does not have any chest pain or shortness of breath. Patient's fall was mechanical. Patient has had redo cardiac bypass in 2010. From medical standpoint of view patient looks stable for surgery. Patient will be kept n.p.o. in anticipation of possible surgery. 2. CAD status post CABG - denies  any chest pain. Continue Coreg. 3. Sick sinus syndrome and atrial flutter status post ablation and pacemaker placement - presently rate controlled. Monitor in telemetry. Continue Coreg. 4. Diabetes mellitus type 2 - mildly hypoglycemic. Hold home regimen of insulin for now. Closely follow CBGs. 5. HIV - patient states his last viral load was undetectable. Continue present medications. 6. Chronic kidney disease with renal artery stenosis - creatinine appears to be at baseline. Closely follow intake output and metabolic panel. 7. Chronic anemia with history of iron deficiency - type and screen and hold. Closely follow CBC. 8. History of CHF - hold Lasix for now until surgery. Patient presently is compensated.  Addendum - patient's INR level has, height and patient states he takes warfarin. At this time in anticipation of surgery patient's Coumadin will be held and vitamin K 5 mg IV along with one unit of FFP has been ordered for transfusion to reverse INR. Recheck INR in a.m. Discussed with Dr. Eulah Pont, orthopedic surgeon. Consider starting heparin infusion after surgery once stable to start heparin once okay with orthopedic surgery.    Code Status: Full code.  Family Communication: None.  Disposition Plan: Admit to inpatient.    [name] Proctor N. [facility] Pager [phone].  If 7PM-7AM, please contact night-coverage [email or web address] Password [facility] 08/11/2013, 9:17 PM

## 2013-08-11 NOTE — Consult Note (Signed)
   ORTHOPAEDIC CONSULTATION  REQUESTING PHYSICIAN: No att. providers found  Chief Complaint: R femoral neck fracture  HPI: [name] Proctor is a 78 y.o. male who complains of  Ground level fall and R hip pain. He is a houshold/community ambulator with a Cane  Past Medical History  Diagnosis Date  . CHF (congestive heart failure)     a. h/o diastolic dysfunction. b. EF 45% by echo 06/2012.  . History of diastolic dysfunction   . SSS (sick sinus syndrome)   . Coronary artery disease     s/p CABG 2010  . History of atrial flutter     s/p ablation  . Chronic renal insufficiency   . Diabetes mellitus type 2, insulin dependent   . History of GI bleed   . Gastritis   . Esophagitis   . Duodenal ulcer   . IDA (iron deficiency anemia)   . Hypertension   . Hyperlipidemia   . Hypothyroidism   . HIV positive   . RAS (renal artery stenosis)   . pacemaker-MDT     Medtronic EnRhythm generator serial #[id number] 2007 Dr. Kersey. Replaced at [facility] 2012     . Atrial fibrillation     By pacemaker interrogation 06/2012 - device reprogrammed    Past Surgical History  Procedure Laterality Date  . Cardiac catheterization  02/22/2009    EF 40-45%  . Coronary angioplasty  03/06/2006    INTRACORONARY STENTING OF SAPHENOUS VEIN GRAFT TO THE PDA AND INTRACORONARY STENTING OF THE SAPHENOUS VEIN GRAFT TO THE DIAGONAL  . Insert / replace / remove pacemaker      IMPLANT  . Ablation saphenous vein w/ rfa    . Renal artery stent      LEFT  . Coronary artery bypass graft  02/2009    REDO. NEW SAPHENOUS VEIN GRAFT TO THE OBTUSE MARGINAL VESSEL AND A SEQUENTIAL SAPHENOUS VEIN GRAFT TO THE PDA AND POSTERIOR LATERAL BRANCHES OF THE RIGHT CORONARY. THE LIMA GRAFT FROM HIS ORIGINAL SURGERY WAS STILL PATENT.  . Pacemaker generator change  3/12    done at [facility]   History   Social History  . Marital Status: Divorced    Spouse Name: N/A    Number of Children: N/A  . Years of Education: N/A   Social History  Main Topics  . Smoking status: Never Smoker   . Smokeless tobacco: None  . Alcohol Use: No  . Drug Use: No  . Sexual Activity: Not Currently   Other Topics Concern  . None   Social History Narrative   One daughter.  Retired wood carver for [employer]   Family History  Problem Relation Age of Onset  . Kidney disease Mother   . Hypertension Father   . Crohn's disease Daughter   . Lupus Sister    Allergies  Allergen Reactions  . Darvocet [Propoxyphene-Acetaminophen] Nausea And Vomiting  . Seldane [Terfenadine] Nausea And Vomiting  . Sulfonylureas Other (See Comments)    Low grade fever  . Ultracet [Tramadol-Acetaminophen] Nausea And Vomiting   Prior to Admission medications   Medication Sig Start Date End Date Taking? Authorizing Provider  abacavir (ZIAGEN) 300 MG tablet Take 300 mg by mouth daily.      Historical Provider, MD  aspirin 81 MG tablet Take 81 mg by mouth daily.      Historical Provider, MD  atorvastatin (LIPITOR) 10 MG tablet Take 10 mg by mouth daily.      Historical Provider, MD  calcium   carbonate (OS-CAL - DOSED IN MG OF ELEMENTAL CALCIUM) 1250 MG tablet Take 2 tablets by mouth daily.    Historical Provider, MD  carvedilol (COREG) 25 MG tablet Take 25 mg by mouth 2 (two) times daily with a meal.      Historical Provider, MD  etravirine (INTELENCE) 100 MG tablet Take 200 mg by mouth 4 (four) times daily.      Historical Provider, MD  furosemide (LASIX) 40 MG tablet Take 1.5 tablets (60 mg total) by mouth 2 (two) times daily. 07/08/12   Dayna N Dunn, PA-C  insulin regular (NOVOLIN R,HUMULIN R) 100 units/mL injection Inject 20 Units into the skin 3 (three) times daily before meals.    Historical Provider, MD  Isopropyl Alcohol (ALCOHOL PREPS EX) Apply topically.      Historical Provider, MD  lamiVUDine (EPIVIR) 150 MG tablet Take 150 mg by mouth daily.    Historical Provider, MD  levothyroxine (SYNTHROID, LEVOTHROID) 50 MCG tablet Take 50 mcg by mouth  daily.    Historical Provider, MD  omeprazole (PRILOSEC) 20 MG capsule Take 20 mg by mouth daily.      Historical Provider, MD  potassium chloride SA (K-DUR,KLOR-CON) 20 MEQ tablet Take 20 mEq by mouth 3 (three) times daily.      Historical Provider, MD  predniSONE (DELTASONE) 5 MG tablet Take 15 mg by mouth daily.    Historical Provider, MD   Dg Chest 1 View  08/11/2013   *RADIOLOGY REPORT*  Clinical Data: Fall, pain over  right  hip  CHEST - 1 VIEW  Comparison: Chest radiograph 07/07/2012  Findings: Left-sided pacemaker overlies enlarged cardiac silhouette.  No effusion, infiltrate, or pneumothorax.  Mild central venous congestion is present.  IMPRESSION: Cardiomegaly mild central venous congestion.   Original Report Authenticated By: Stewart Edmunds, M.D.   Dg Hip Complete Right  08/11/2013   *RADIOLOGY REPORT*  Clinical Data: Fall, right hip pain  RIGHT HIP - COMPLETE 2+ VIEW  Comparison: None.  Findings: There is an acute fracture of the right femoral neck with varus angulation.  The fractures is either basocervical or sub capital .  There is superior migration of the distal fracture fragment (greater trochanter).  IMPRESSION: Acute right femoral neck fracture.   Original Report Authenticated By: Stewart Edmunds, M.D.    Positive ROS: All other systems have been reviewed and were otherwise negative with the exception of those mentioned in the HPI and as above.  Physical Exam: Filed Vitals:   08/11/13 2115  BP: 160/84  Pulse: 63  Temp:   Resp: 22   General: Alert, no acute distress Cardiovascular: No pedal edema Respiratory: No cyanosis, no use of accessory musculature GI: No organomegaly, abdomen is soft and non-tender Skin: No lesions in the area of chief complaint Neurologic: Sensation intact distally Psychiatric: Patient is competent for consent with normal mood and affect Lymphatic: No axillary or cervical lymphadenopathy  MUSCULOSKELETAL:  RLE: SILT DP/SP/S/S/T nerve, 2+ DP,  +TA/GS/EHL Compartments soft Pain with any ROM  Other extremities are atraumatic with painless ROM and NVI.  Assessment: Right Femoral neck fracture  Plan: Plan for Right hemiarthroplasty after reversal of coumadin (INR 2.9 currently) Weight Bearing Status: bedrest, will adjust post op PT/OT when appropriate VTE px: SCD's and chemical prophylaxis post op   Romen Yutzy, D, MD Cell [phone]   08/11/2013 9:41 PM     

## 2013-08-11 NOTE — ED Notes (Signed)
CBG 160 

## 2013-08-11 NOTE — ED Notes (Addendum)
Lost balance while walking - just came out of the house, then fell on his rt. Side; no loc; their is shortening of the rt. Leg, and external rotation. pedal pulse moderate, no numbness/tingling, cap. Refill wnl. EMS gave 50 mcq of fentanyl iv.  bp 134/86 hr. 88, sats. 98 RA. 18 ga. piv lt. A/c. 12 lead WNL. Paced. Pt. Did fall on some glass - abrasion to rt elbow.

## 2013-08-12 DIAGNOSIS — N184 Chronic kidney disease, stage 4 (severe): Secondary | ICD-10-CM

## 2013-08-12 DIAGNOSIS — D649 Anemia, unspecified: Secondary | ICD-10-CM

## 2013-08-12 LAB — URINALYSIS, ROUTINE W REFLEX MICROSCOPIC
Glucose, UA: NEGATIVE mg/dL
Specific Gravity, Urine: 1.017 (ref 1.005–1.030)
pH: 6 (ref 5.0–8.0)

## 2013-08-12 LAB — GLUCOSE, CAPILLARY
Glucose-Capillary: 120 mg/dL — ABNORMAL HIGH (ref 70–99)
Glucose-Capillary: 165 mg/dL — ABNORMAL HIGH (ref 70–99)
Glucose-Capillary: 233 mg/dL — ABNORMAL HIGH (ref 70–99)
Glucose-Capillary: 54 mg/dL — ABNORMAL LOW (ref 70–99)
Glucose-Capillary: 72 mg/dL (ref 70–99)

## 2013-08-12 LAB — BASIC METABOLIC PANEL
GFR calc Af Amer: 41 mL/min — ABNORMAL LOW (ref >90)
GFR calc non Af Amer: 35 mL/min — ABNORMAL LOW (ref >90)
Glucose, Bld: 66 mg/dL — ABNORMAL LOW (ref 70–99)
Potassium: 3.9 mEq/L (ref 3.5–5.1)
Sodium: 139 mEq/L (ref 135–145)

## 2013-08-12 LAB — PROTIME-INR
INR: 1.86 — ABNORMAL HIGH (ref 0.00–1.49)
Prothrombin Time: 20.9 seconds — ABNORMAL HIGH (ref 11.6–15.2)

## 2013-08-12 LAB — APTT: aPTT: 49 seconds — ABNORMAL HIGH (ref 24–37)

## 2013-08-12 LAB — CBC
MCH: 27 pg (ref 26.0–34.0)
MCHC: 31.3 g/dL (ref 30.0–36.0)
MCV: 86.1 fL (ref 78.0–100.0)
Platelets: 206 10*3/uL (ref 150–400)

## 2013-08-12 MED ORDER — SULFAMETHOXAZOLE-TRIMETHOPRIM 800-160 MG PO TABS: 1.0000 | ORAL_TABLET | ORAL | Status: DC

## 2013-08-12 MED ORDER — AMMONIUM LACTATE 12 % EX LOTN
TOPICAL_LOTION | Freq: Three times a day (TID) | CUTANEOUS | Status: DC | PRN
  Filled 2013-08-12: qty 400

## 2013-08-12 MED ORDER — ABACAVIR SULFATE 300 MG PO TABS
600.0000 mg | ORAL_TABLET | Freq: Every day | ORAL | Status: DC
  Administered 2013-08-14 – 2013-08-15 (×2): 600 mg via ORAL
  Filled 2013-08-12 (×4): qty 2

## 2013-08-12 MED ORDER — AMMONIUM LACTATE 12 % EX CREA: 1.0000 | TOPICAL_CREAM | Freq: Three times a day (TID) | CUTANEOUS | Status: DC | PRN

## 2013-08-12 MED ORDER — SULFAMETHOXAZOLE-TMP DS 800-160 MG PO TABS
1.0000 | ORAL_TABLET | ORAL | Status: DC
  Administered 2013-08-15: 1 via ORAL
  Filled 2013-08-12 (×2): qty 1

## 2013-08-12 MED ORDER — SENNOSIDES-DOCUSATE SODIUM 8.6-50 MG PO TABS: 1.0000 | ORAL_TABLET | Freq: Two times a day (BID) | ORAL | Status: DC | PRN

## 2013-08-12 MED ORDER — ASPIRIN 81 MG PO TABS: 81.0000 mg | ORAL_TABLET | Freq: Every day | ORAL | Status: DC

## 2013-08-12 MED ORDER — DEXTROSE 50 % IV SOLN
INTRAVENOUS | Status: AC
  Administered 2013-08-12: 50 mL
  Filled 2013-08-12: qty 50

## 2013-08-12 MED ORDER — ASPIRIN EC 81 MG PO TBEC
81.0000 mg | DELAYED_RELEASE_TABLET | Freq: Every day | ORAL | Status: DC
  Administered 2013-08-14 – 2013-08-15 (×2): 81 mg via ORAL
  Filled 2013-08-12 (×4): qty 1

## 2013-08-12 MED ORDER — HYDRALAZINE HCL 25 MG PO TABS
25.0000 mg | ORAL_TABLET | Freq: Three times a day (TID) | ORAL | Status: DC
  Administered 2013-08-12 – 2013-08-15 (×8): 25 mg via ORAL
  Filled 2013-08-12 (×12): qty 1

## 2013-08-12 MED ORDER — ALLOPURINOL 300 MG PO TABS
400.0000 mg | ORAL_TABLET | Freq: Every day | ORAL | Status: DC
  Administered 2013-08-12 – 2013-08-15 (×3): 400 mg via ORAL
  Filled 2013-08-12 (×4): qty 1

## 2013-08-12 MED ORDER — PANTOPRAZOLE SODIUM 40 MG PO TBEC
40.0000 mg | DELAYED_RELEASE_TABLET | Freq: Every day | ORAL | Status: DC
  Administered 2013-08-14 – 2013-08-15 (×2): 40 mg via ORAL
  Filled 2013-08-12 (×2): qty 1

## 2013-08-12 MED ORDER — ETRAVIRINE 100 MG PO TABS
200.0000 mg | ORAL_TABLET | Freq: Two times a day (BID) | ORAL | Status: DC
  Administered 2013-08-13 – 2013-08-15 (×4): 200 mg via ORAL
  Filled 2013-08-12 (×8): qty 2

## 2013-08-12 NOTE — Progress Notes (Signed)
Utilization review complete. Quamaine Webb RN CCM Case Mgmt phone [phone] 

## 2013-08-12 NOTE — Progress Notes (Signed)
[facility] PROGRESS NOTE  [name] Proctor ZOX:[id number] DOB: Oct 10, 1935 DOA: 08/11/2013 PCP: Pcp Not In System   Brief narrative: 78 y.o. male history of CAD status post CABG (redo bypass in 2010), sick sinus syndrome status post pacemaker placement, atrial flutter status post ablation and on chr coumadin, iron deficiency anemia, chronic kidney disease, diabetes mellitus type 2, HTN, HL, hypothyroidismHIV presented to the ER because of fall  Assessment/Plan: Acute right femoral neck fracture  Reports this to be mechanical. Continue pain control. Seen by Dr Eulah Pont. Given elevated INR, recommends scheduling surgery on 8/20. Given vitamin k and 1 u FFP for INR reversal. Patient should resume coumadin post op and given significant cardiac hx should bridge with IV heparin or lovenox. PT eval   CAD status post CABG  - denies any symptoms. Continue Coreg, ASA Sick sinus syndrome S/p  pacemaker placement stable on telemetry. His cardiologist is Dr Thomasene Lot who can be consulted as needed.  Aflutter  s/p ablation   on coumadin which is held for sx   CHF clinically euvolemic. Echo from 2013 with EF of 45%. Holding lasix for now  Diabetes mellitus type 2 Noted for low fsg on admission. holding insulin. Monitor with SSI.  Chronic kidney disease with renal artery stenosis  Renal fn at baseline. Monitor   Iron def anemia  hb currently stable.  HIV  follows at [place]. continue ART  Hypothyroidism  continue synthroid   Code Status: full Family Communication: family member and POA at bedside Disposition Plan: SNF   Consultants:  Dr Eulah Pont ( ortho)  Procedures:  For OR on 8/20 if INR corrected  Antibiotics:  none  HPI/Subjective: Patient seen and examined this am. Reports pain to be better.   Objective: Filed Vitals:   08/12/13 0453  BP: 160/70  Pulse: 60  Temp: 97.6 F (36.4 C)  Resp:     Intake/Output Summary (Last 24 hours) at 08/12/13 1052 Last data filed  at 08/12/13 0700  Gross per 24 hour  Intake    780 ml  Output      0 ml  Net    780 ml   There were no vitals filed for this visit.  Exam:   General:  Elderly male in NAD  HEENT: no pallor, moist oral mucosa  Chest: clear b/l, no added sounds  CVS: NS1&S2, no murmurs, rubs or gallop  Abd: soft, NT, ND BS+, foley in place  Ext: warm, painful rt hip on movement, no edema  CNS: AAOX3    Data Reviewed: Basic Metabolic Panel:  Recent Labs Lab 08/11/13 1852 08/12/13 0430  NA 137 139  K 3.5 3.9  CL 102 102  CO2 24 23  GLUCOSE 49* 66*  BUN 51* 49*  CREATININE 1.66* 1.78*  CALCIUM 9.0 9.6   Liver Function Tests: No results found for this basename: AST, ALT, ALKPHOS, BILITOT, PROT, ALBUMIN,  in the last 168 hours No results found for this basename: LIPASE, AMYLASE,  in the last 168 hours No results found for this basename: AMMONIA,  in the last 168 hours CBC:  Recent Labs Lab 08/11/13 1852 08/12/13 0430  WBC 8.6 9.3  NEUTROABS 6.3  --   HGB 9.6* 9.9*  HCT 29.4* 31.6*  MCV 85.5 86.1  PLT 227 206   Cardiac Enzymes: No results found for this basename: CKTOTAL, CKMB, CKMBINDEX, TROPONINI,  in the last 168 hours BNP (last 3 results) No results found for this basename: PROBNP,  in the last 8760  hours CBG:  Recent Labs Lab 08/11/13 2144 08/11/13 2228 08/12/13 0017 08/12/13 0330 08/12/13 0644  GLUCAP 160* 88 54* 71 72    Recent Results (from the past 240 hour(s))  SURGICAL PCR SCREEN     Status: None   Collection Time    08/12/13  6:17 AM      Result Value Range Status   MRSA, PCR NEGATIVE  NEGATIVE Final   Staphylococcus aureus NEGATIVE  NEGATIVE Final   Comment:            The Xpert SA Assay (FDA     approved for NASAL specimens     in patients over 58 years of age),     is one component of     a comprehensive surveillance     program.  Test performance has     been validated by [employer] for patients greater     than or equal to 41  year old.     It is not intended     to diagnose infection nor to     guide or monitor treatment.     Studies: Dg Chest 1 View  08/11/2013   *RADIOLOGY REPORT*  Clinical Data: Fall, pain over  right  hip  CHEST - 1 VIEW  Comparison: Chest radiograph 07/07/2012  Findings: Left-sided pacemaker overlies enlarged cardiac silhouette.  No effusion, infiltrate, or pneumothorax.  Mild central venous congestion is present.  IMPRESSION: Cardiomegaly mild central venous congestion.   Original Report Authenticated By: Genevive Bi, M.D.   Dg Hip Complete Right  08/11/2013   *RADIOLOGY REPORT*  Clinical Data: Fall, right hip pain  RIGHT HIP - COMPLETE 2+ VIEW  Comparison: None.  Findings: There is an acute fracture of the right femoral neck with varus angulation.  The fractures is either basocervical or sub capital .  There is superior migration of the distal fracture fragment (greater trochanter).  IMPRESSION: Acute right femoral neck fracture.   Original Report Authenticated By: Genevive Bi, M.D.    Scheduled Meds: . abacavir  300 mg Oral Daily  . aspirin  81 mg Oral Daily  . atorvastatin  10 mg Oral Daily  . calcium carbonate  1 tablet Oral BID WC  . carvedilol  25 mg Oral BID WC  . etravirine  200 mg Oral QID  . hydrocortisone sod succinate (SOLU-CORTEF) inj  50 mg Intravenous Q8H  . insulin aspart  0-9 Units Subcutaneous TID WC  . lamiVUDine  150 mg Oral Daily  . levothyroxine  50 mcg Oral QAC breakfast  . pantoprazole  40 mg Oral Daily   Continuous Infusions: . dextrose Stopped (08/11/13 2144)       Time spent: 25 minutes    Sidra Oldfield  [facility] Pager [phone]. If 7PM-7AM, please contact night-coverage at [email or web address], password [facility] 08/12/2013, 10:52 AM  LOS: 1 day

## 2013-08-12 NOTE — Progress Notes (Signed)
When I flushed lt ac iv, it was bleeding and was causing pt pain.  So, ffp could not be started at 1730, after 2 IV insertion attempts by bedside RN, IV team notified.  IV team was able to insert IV.   FFP actually started 1822.

## 2013-08-12 NOTE — Progress Notes (Signed)
INITIAL NUTRITION ASSESSMENT  DOCUMENTATION CODES Per approved criteria  -Not Applicable   INTERVENTION: Discussed high protein foods to support healing. Pt declined oral nutrition supplements at this time. RD to continue to follow nutrition care plan.  NUTRITION DIAGNOSIS: Increased nutrient needs related to healing as evidenced by estimated needs.   Goal: Intake to meet >90% of estimated nutrition needs.  Monitor:  weight trends, lab trends, I/O's, PO intake, supplement tolerance  Reason for Assessment: MD Consult (Hip Fx Protocol)  78 y.o. male  Admitting Dx: Femoral neck fracture  ASSESSMENT: PMHx of CAD, iron deficiency anemia, CKD, DM2, HIV. Admitted s/p fall. Work-up reveals R hip fx. Plan for R hemiarthroplasty once coumadin is reversed.  Currently ordered for Carbohydrate Modified Medium diet. Pt is eating approximately 50% of meal trays. He states that PTA, he was eating well and his doctor told him to not drink supplements such as Ensure Complete. He notes that he has been on prednisone for a few months and it increased his appetite and caused him to gain weight. Pt declining oral nutrition supplements at this time. Discussed high protein foods and encouraged pt to eat those items off of meal trays. Pt verbalized understanding.  Pt is at nutrition risk given chronic medical issues.  Height: Ht Readings from Last 1 Encounters:  07/07/12 6\' 2"  (1.88 m)    Weight: 177 lb (per pt)  Ideal Body Weight: 190 lb/86.4 kg  % Ideal Body Weight: 93%  Wt Readings from Last 10 Encounters:  07/09/12 163 lb 5.8 oz (74.1 kg)  10/04/11 172 lb (78.019 kg)    Usual Body Weight: 165 lb (per pt)  % Usual Body Weight: 107%  BMI:  22.7 kg/m^2 - WNL  Estimated Nutritional Needs: Kcal: 1800 - 2100  Protein: 80 - 90 g  Fluid: 1.8 - 2 liters   Skin: skin tear to R arm  Diet Order: Carb Control Medium  EDUCATION NEEDS: -No education needs identified at this  time   Intake/Output Summary (Last 24 hours) at 08/12/13 1117 Last data filed at 08/12/13 0700  Gross per 24 hour  Intake    780 ml  Output      0 ml  Net    780 ml    Last BM: 8/18  Labs:   Recent Labs Lab 08/11/13 1852 08/12/13 0430  NA 137 139  K 3.5 3.9  CL 102 102  CO2 24 23  BUN 51* 49*  CREATININE 1.66* 1.78*  CALCIUM 9.0 9.6  GLUCOSE 49* 66*    CBG (last 3)   Recent Labs  08/12/13 0330 08/12/13 0644 08/12/13 1101  GLUCAP 71 72 120*    Scheduled Meds: . abacavir  300 mg Oral Daily  . aspirin  81 mg Oral Daily  . atorvastatin  10 mg Oral Daily  . calcium carbonate  1 tablet Oral BID WC  . carvedilol  25 mg Oral BID WC  . etravirine  200 mg Oral QID  . hydrocortisone sod succinate (SOLU-CORTEF) inj  50 mg Intravenous Q8H  . insulin aspart  0-9 Units Subcutaneous TID WC  . lamiVUDine  150 mg Oral Daily  . levothyroxine  50 mcg Oral QAC breakfast  . pantoprazole  40 mg Oral Daily    Continuous Infusions: . dextrose Stopped (08/11/13 2144)    Past Medical History  Diagnosis Date  . CHF (congestive heart failure)     a. h/o diastolic dysfunction. b. EF 45% by echo 06/2012.  Marland Kitchen History  of diastolic dysfunction   . SSS (sick sinus syndrome)   . Coronary artery disease     s/p CABG 2010  . History of atrial flutter     s/p ablation  . Chronic renal insufficiency   . Diabetes mellitus type 2, insulin dependent   . History of GI bleed   . Gastritis   . Esophagitis   . Duodenal ulcer   . IDA (iron deficiency anemia)   . Hypertension   . Hyperlipidemia   . Hypothyroidism   . HIV positive   . RAS (renal artery stenosis)   . pacemaker-MDT     Medtronic EnRhythm generator serial [id number] H 2007 Dr. Reyes Ivan. Replaced at [facility] 2012     . Atrial fibrillation     By pacemaker interrogation 06/2012 - device reprogrammed     Past Surgical History  Procedure Laterality Date  . Cardiac catheterization  02/22/2009    EF 40-45%  . Coronary angioplasty   03/06/2006    INTRACORONARY STENTING OF SAPHENOUS VEIN GRAFT TO THE PDA AND INTRACORONARY STENTING OF THE SAPHENOUS VEIN GRAFT TO THE DIAGONAL  . Insert / replace / remove pacemaker      IMPLANT  . Ablation saphenous vein w/ rfa    . Renal artery stent      LEFT  . Coronary artery bypass graft  02/2009    REDO. NEW SAPHENOUS VEIN GRAFT TO THE OBTUSE MARGINAL VESSEL AND A SEQUENTIAL SAPHENOUS VEIN GRAFT TO THE PDA AND POSTERIOR LATERAL BRANCHES OF THE RIGHT CORONARY. THE LIMA GRAFT FROM HIS ORIGINAL SURGERY WAS STILL PATENT.  Marland Kitchen Pacemaker generator change  3/12    done at [facility] MS, RD, LDN Pager: [phone] After-hours pager: [phone]

## 2013-08-12 NOTE — ED Provider Notes (Signed)
Medical screening examination/treatment/procedure(s) were conducted as a shared visit with non-physician practitioner(s) and myself.  I personally evaluated the patient during the encounter   Gwyneth Sprout, MD 08/12/13 1831

## 2013-08-13 ENCOUNTER — Inpatient Hospital Stay (HOSPITAL_COMMUNITY): Payer: Medicare Other

## 2013-08-13 ENCOUNTER — Encounter (HOSPITAL_COMMUNITY)
Admission: EM | Disposition: A | Discharge status: Skilled Nursing Facility | Payer: Self-pay | Source: Home / Self Care | Attending: Internal Medicine

## 2013-08-13 ENCOUNTER — Inpatient Hospital Stay (HOSPITAL_COMMUNITY): Payer: Medicare Other | Admitting: Anesthesiology

## 2013-08-13 ENCOUNTER — Encounter (HOSPITAL_COMMUNITY): Payer: Self-pay | Admitting: Anesthesiology

## 2013-08-13 ENCOUNTER — Encounter (HOSPITAL_COMMUNITY): Payer: Self-pay | Admitting: General Practice

## 2013-08-13 DIAGNOSIS — I509 Heart failure, unspecified: Secondary | ICD-10-CM

## 2013-08-13 DIAGNOSIS — I5033 Acute on chronic diastolic (congestive) heart failure: Secondary | ICD-10-CM

## 2013-08-13 DIAGNOSIS — Z950 Presence of cardiac pacemaker: Secondary | ICD-10-CM

## 2013-08-13 DIAGNOSIS — I4892 Unspecified atrial flutter: Secondary | ICD-10-CM

## 2013-08-13 HISTORY — PX: TOTAL HIP ARTHROPLASTY: SHX124

## 2013-08-13 LAB — BASIC METABOLIC PANEL
BUN: 54 mg/dL — ABNORMAL HIGH (ref 6–23)
Creatinine, Ser: 1.89 mg/dL — ABNORMAL HIGH (ref 0.50–1.35)
GFR calc non Af Amer: 33 mL/min — ABNORMAL LOW (ref >90)
Glucose, Bld: 218 mg/dL — ABNORMAL HIGH (ref 70–99)
Potassium: 4.3 mEq/L (ref 3.5–5.1)

## 2013-08-13 LAB — GLUCOSE, CAPILLARY
Glucose-Capillary: 185 mg/dL — ABNORMAL HIGH (ref 70–99)
Glucose-Capillary: 179 mg/dL — ABNORMAL HIGH (ref 70–99)
Glucose-Capillary: 213 mg/dL — ABNORMAL HIGH (ref 70–99)
Glucose-Capillary: 308 mg/dL — ABNORMAL HIGH (ref 70–99)

## 2013-08-13 LAB — PREPARE FRESH FROZEN PLASMA
Unit division: 0
Unit division: 0
Unit division: 0

## 2013-08-13 LAB — URINE CULTURE
Colony Count: NO GROWTH
Culture: NO GROWTH

## 2013-08-13 LAB — PREPARE RBC (CROSSMATCH)

## 2013-08-13 SURGERY — ARTHROPLASTY, HIP, TOTAL,POSTERIOR APPROACH
Anesthesia: General | Site: Hip | Laterality: Right | Wound class: Clean

## 2013-08-13 SURGERY — HEMIARTHROPLASTY, HIP, DIRECT ANTERIOR APPROACH, FOR FRACTURE
Anesthesia: General | Laterality: Right

## 2013-08-13 MED ORDER — METOCLOPRAMIDE HCL 5 MG/ML IJ SOLN: 5.0000 mg | Freq: Three times a day (TID) | INTRAMUSCULAR | Status: DC | PRN

## 2013-08-13 MED ORDER — CEFAZOLIN SODIUM 1-5 GM-% IV SOLN
INTRAVENOUS | Status: AC
  Filled 2013-08-13: qty 100

## 2013-08-13 MED ORDER — CEFAZOLIN SODIUM-DEXTROSE 2-3 GM-% IV SOLR
2.0000 g | Freq: Four times a day (QID) | INTRAVENOUS | Status: AC
  Administered 2013-08-13 – 2013-08-14 (×2): 2 g via INTRAVENOUS
  Filled 2013-08-13 (×2): qty 50

## 2013-08-13 MED ORDER — 0.9 % SODIUM CHLORIDE (POUR BTL) OPTIME
TOPICAL | Status: DC | PRN
  Administered 2013-08-13: 1000 mL

## 2013-08-13 MED ORDER — ROCURONIUM BROMIDE 100 MG/10ML IV SOLN
INTRAVENOUS | Status: DC | PRN
  Administered 2013-08-13: 40 mg via INTRAVENOUS
  Administered 2013-08-13 (×2): 10 mg via INTRAVENOUS

## 2013-08-13 MED ORDER — WARFARIN SODIUM 6 MG PO TABS
6.0000 mg | ORAL_TABLET | Freq: Once | ORAL | Status: AC
  Administered 2013-08-13: 6 mg via ORAL
  Filled 2013-08-13: qty 1

## 2013-08-13 MED ORDER — MORPHINE SULFATE 2 MG/ML IJ SOLN: 0.5000 mg | INTRAMUSCULAR | Status: DC | PRN

## 2013-08-13 MED ORDER — GLYCOPYRROLATE 0.2 MG/ML IJ SOLN
INTRAMUSCULAR | Status: DC | PRN
  Administered 2013-08-13 (×2): .4 mg via INTRAVENOUS

## 2013-08-13 MED ORDER — BUPIVACAINE-EPINEPHRINE (PF) 0.5% -1:200000 IJ SOLN
INTRAMUSCULAR | Status: AC
  Filled 2013-08-13: qty 10

## 2013-08-13 MED ORDER — MENTHOL 3 MG MT LOZG: 1.0000 | LOZENGE | OROMUCOSAL | Status: DC | PRN

## 2013-08-13 MED ORDER — LACTATED RINGERS IV SOLN
INTRAVENOUS | Status: DC | PRN
  Administered 2013-08-13: 10:00:00 via INTRAVENOUS

## 2013-08-13 MED ORDER — LIDOCAINE HCL (CARDIAC) 20 MG/ML IV SOLN
INTRAVENOUS | Status: DC | PRN
  Administered 2013-08-13: 80 mg via INTRAVENOUS

## 2013-08-13 MED ORDER — PROPOFOL 10 MG/ML IV BOLUS
INTRAVENOUS | Status: DC | PRN
  Administered 2013-08-13: 100 mg via INTRAVENOUS

## 2013-08-13 MED ORDER — ONDANSETRON HCL 4 MG PO TABS: 4.0000 mg | ORAL_TABLET | Freq: Four times a day (QID) | ORAL | Status: DC | PRN

## 2013-08-13 MED ORDER — DEXAMETHASONE SODIUM PHOSPHATE 4 MG/ML IJ SOLN
INTRAMUSCULAR | Status: DC | PRN
  Administered 2013-08-13: 8 mg via INTRAVENOUS

## 2013-08-13 MED ORDER — METOCLOPRAMIDE HCL 10 MG PO TABS: 5.0000 mg | ORAL_TABLET | Freq: Three times a day (TID) | ORAL | Status: DC | PRN

## 2013-08-13 MED ORDER — DEXTROSE 5 % IV SOLN: INTRAVENOUS | Status: DC | PRN

## 2013-08-13 MED ORDER — CEFAZOLIN SODIUM-DEXTROSE 2-3 GM-% IV SOLR: INTRAVENOUS | Status: DC | PRN

## 2013-08-13 MED ORDER — SODIUM CHLORIDE 0.9 % IV SOLN
INTRAVENOUS | Status: DC
  Administered 2013-08-13 – 2013-08-14 (×2): via INTRAVENOUS

## 2013-08-13 MED ORDER — CEFAZOLIN SODIUM 1-5 GM-% IV SOLN
INTRAVENOUS | Status: DC | PRN
  Administered 2013-08-13 (×2): 1 g via INTRAVENOUS

## 2013-08-13 MED ORDER — PROMETHAZINE HCL 25 MG/ML IJ SOLN: 6.2500 mg | INTRAMUSCULAR | Status: DC | PRN

## 2013-08-13 MED ORDER — PHENOL 1.4 % MT LIQD
1.0000 | OROMUCOSAL | Status: DC | PRN
  Filled 2013-08-13: qty 177

## 2013-08-13 MED ORDER — WARFARIN SODIUM 5 MG PO TABS: 5.0000 mg | ORAL_TABLET | Freq: Every day | ORAL | Status: DC

## 2013-08-13 MED ORDER — BUPIVACAINE-EPINEPHRINE 0.5% -1:200000 IJ SOLN
INTRAMUSCULAR | Status: DC | PRN
  Administered 2013-08-13: 20 mL

## 2013-08-13 MED ORDER — HYDROCODONE-ACETAMINOPHEN 5-325 MG PO TABS: 1.0000 | ORAL_TABLET | Freq: Four times a day (QID) | ORAL | Status: DC | PRN

## 2013-08-13 MED ORDER — MIDAZOLAM HCL 5 MG/5ML IJ SOLN
INTRAMUSCULAR | Status: DC | PRN
  Administered 2013-08-13: 2 mg via INTRAVENOUS

## 2013-08-13 MED ORDER — FENTANYL CITRATE 0.05 MG/ML IJ SOLN
INTRAMUSCULAR | Status: DC | PRN
  Administered 2013-08-13: 50 ug via INTRAVENOUS
  Administered 2013-08-13: 100 ug via INTRAVENOUS

## 2013-08-13 MED ORDER — WARFARIN - PHARMACIST DOSING INPATIENT: Freq: Every day | Status: DC

## 2013-08-13 MED ORDER — ONDANSETRON HCL 4 MG/2ML IJ SOLN
INTRAMUSCULAR | Status: DC | PRN
  Administered 2013-08-13: 4 mg via INTRAVENOUS

## 2013-08-13 MED ORDER — DEXTROSE 5 % IV SOLN
INTRAVENOUS | Status: DC | PRN
  Administered 2013-08-13 (×2): via INTRAVENOUS

## 2013-08-13 MED ORDER — NEOSTIGMINE METHYLSULFATE 1 MG/ML IJ SOLN
INTRAMUSCULAR | Status: DC | PRN
  Administered 2013-08-13 (×2): 2.5 mg via INTRAVENOUS

## 2013-08-13 MED ORDER — DEXTROSE 5 % IV SOLN
INTRAVENOUS | Status: DC | PRN
  Administered 2013-08-13: 10:00:00 via INTRAVENOUS

## 2013-08-13 MED ORDER — ONDANSETRON HCL 4 MG/2ML IJ SOLN: 4.0000 mg | Freq: Four times a day (QID) | INTRAMUSCULAR | Status: DC | PRN

## 2013-08-13 MED ORDER — HYDROMORPHONE HCL PF 1 MG/ML IJ SOLN: 0.2500 mg | INTRAMUSCULAR | Status: DC | PRN

## 2013-08-13 SURGICAL SUPPLY — 70 items
BENZOIN TINCTURE PRP APPL 2/3 (GAUZE/BANDAGES/DRESSINGS) ×2 IMPLANT
BLADE SAW SAG 73X25 THK (BLADE) ×1
BLADE SAW SGTL 73X25 THK (BLADE) ×1 IMPLANT
BOOTCOVER CLEANROOM LRG (PROTECTIVE WEAR) IMPLANT
BRUSH FEMORAL CANAL (MISCELLANEOUS) IMPLANT
CLOTH BEACON ORANGE TIMEOUT ST (SAFETY) ×2 IMPLANT
COVER BACK TABLE 24X17X13 BIG (DRAPES) IMPLANT
DRAPE INCISE IOBAN 85X60 (DRAPES) IMPLANT
DRAPE ORTHO SPLIT 77X108 STRL (DRAPES) ×4
DRAPE SURG ORHT 6 SPLT 77X108 (DRAPES) ×2 IMPLANT
DRAPE U-SHAPE 47X51 STRL (DRAPES) ×2 IMPLANT
DRILL BIT 7/64X5 (BIT) IMPLANT
DRSG ADAPTIC 3X8 NADH LF (GAUZE/BANDAGES/DRESSINGS) IMPLANT
DRSG MEPILEX BORDER 4X12 (GAUZE/BANDAGES/DRESSINGS) ×2 IMPLANT
DRSG PAD ABDOMINAL 8X10 ST (GAUZE/BANDAGES/DRESSINGS) IMPLANT
DURAPREP 26ML APPLICATOR (WOUND CARE) ×2 IMPLANT
ELECT BLADE 6.5 EXT (BLADE) IMPLANT
ELECT CAUTERY BLADE 6.4 (BLADE) ×2 IMPLANT
ELECT REM PT RETURN 9FT ADLT (ELECTROSURGICAL) ×2
ELECTRODE REM PT RTRN 9FT ADLT (ELECTROSURGICAL) ×1 IMPLANT
EVACUATOR 1/8 PVC DRAIN (DRAIN) IMPLANT
FACESHIELD LNG OPTICON STERILE (SAFETY) ×2 IMPLANT
GAUZE XEROFORM 5X9 LF (GAUZE/BANDAGES/DRESSINGS) IMPLANT
GLOVE BIO SURGEON STRL SZ7.5 (GLOVE) ×2 IMPLANT
GLOVE BIO SURGEON STRL SZ8 (GLOVE) ×2 IMPLANT
GLOVE BIOGEL PI IND STRL 7.0 (GLOVE) ×3 IMPLANT
GLOVE BIOGEL PI IND STRL 8 (GLOVE) ×2 IMPLANT
GLOVE BIOGEL PI INDICATOR 7.0 (GLOVE) ×3
GLOVE BIOGEL PI INDICATOR 8 (GLOVE) ×2
GLOVE ECLIPSE 6.5 STRL STRAW (GLOVE) ×6 IMPLANT
GLOVE ORTHO TXT STRL SZ7.5 (GLOVE) IMPLANT
GOWN PREVENTION PLUS XLARGE (GOWN DISPOSABLE) ×10 IMPLANT
GOWN STRL NON-REIN LRG LVL3 (GOWN DISPOSABLE) IMPLANT
HANDPIECE INTERPULSE COAX TIP (DISPOSABLE)
HIP HEMIARTHROPLASTY LEV 1A ×2 IMPLANT
KIT BASIN OR (CUSTOM PROCEDURE TRAY) ×2 IMPLANT
KIT ROOM TURNOVER OR (KITS) ×2 IMPLANT
MANIFOLD NEPTUNE II (INSTRUMENTS) ×2 IMPLANT
NS IRRIG 1000ML POUR BTL (IV SOLUTION) ×2 IMPLANT
PACK TOTAL JOINT (CUSTOM PROCEDURE TRAY) ×2 IMPLANT
PAD ARMBOARD 7.5X6 YLW CONV (MISCELLANEOUS) ×4 IMPLANT
PASSER SUT SWANSON 36MM LOOP (INSTRUMENTS) ×2 IMPLANT
PRESSURIZER FEMORAL UNIV (MISCELLANEOUS) IMPLANT
SET HNDPC FAN SPRY TIP SCT (DISPOSABLE) IMPLANT
SPONGE GAUZE 4X4 12PLY (GAUZE/BANDAGES/DRESSINGS) IMPLANT
SPONGE LAP 18X18 X RAY DECT (DISPOSABLE) IMPLANT
SPONGE LAP 4X18 X RAY DECT (DISPOSABLE) IMPLANT
STAPLER VISISTAT 35W (STAPLE) IMPLANT
STRIP CLOSURE SKIN 1/2X4 (GAUZE/BANDAGES/DRESSINGS) ×4 IMPLANT
SUCTION FRAZIER TIP 10 FR DISP (SUCTIONS) ×2 IMPLANT
SUT FIBERWIRE #2 38 REV NDL BL (SUTURE) ×4
SUT VIC AB 0 CT1 27 (SUTURE) ×2
SUT VIC AB 0 CT1 27XBRD ANBCTR (SUTURE) ×1 IMPLANT
SUT VIC AB 1 CTX 36 (SUTURE) ×2
SUT VIC AB 1 CTX36XBRD ANBCTR (SUTURE) ×1 IMPLANT
SUT VIC AB 2-0 CT1 27 (SUTURE)
SUT VIC AB 2-0 CT1 TAPERPNT 27 (SUTURE) IMPLANT
SUT VIC AB 2-0 SH 27 (SUTURE)
SUT VIC AB 2-0 SH 27X BRD (SUTURE) IMPLANT
SUT VIC AB 2-0 SH 27XBRD (SUTURE) IMPLANT
SUT VIC AB 3-0 SH 18 (SUTURE) ×2 IMPLANT
SUT VIC AB 3-0 SH 27 (SUTURE)
SUT VIC AB 3-0 SH 27X BRD (SUTURE) IMPLANT
SUTURE FIBERWR#2 38 REV NDL BL (SUTURE) ×2 IMPLANT
SYR 30ML SLIP (SYRINGE) IMPLANT
TOWEL OR 17X24 6PK STRL BLUE (TOWEL DISPOSABLE) ×2 IMPLANT
TOWEL OR 17X26 10 PK STRL BLUE (TOWEL DISPOSABLE) ×2 IMPLANT
TOWER CARTRIDGE SMART MIX (DISPOSABLE) IMPLANT
TRAY FOLEY CATH 16FRSI W/METER (SET/KITS/TRAYS/PACK) IMPLANT
WATER STERILE IRR 1000ML POUR (IV SOLUTION) ×2 IMPLANT

## 2013-08-13 NOTE — Anesthesia Postprocedure Evaluation (Signed)
Anesthesia Post Note  Patient: [name] Proctor  Procedure(s) Performed: Procedure(s) (LRB): Right Hip Hemiarthroplasty (Right)  Anesthesia type: general  Patient location: PACU  Post pain: Pain level controlled  Post assessment: Patient's Cardiovascular Status Stable  Last Vitals:  Filed Vitals:   08/13/13 1445  BP: 143/74  Pulse: 58  Temp:   Resp: 20    Post vital signs: Reviewed and stable  Level of consciousness: sedated  Complications: No apparent anesthesia complications

## 2013-08-13 NOTE — Preoperative (Signed)
Beta Blockers   Reason not to administer Beta Blockers:RN from [facility] [place] stated that Coreg was held this morning because it was not in the pt's drawer; to eval for B-blocker intraop

## 2013-08-13 NOTE — Op Note (Signed)
08/11/2013 - 08/13/2013  2:04 PM  PATIENT:  [name] Proctor   MRN: [id number]  PRE-OPERATIVE DIAGNOSIS:  Right Hip Fracture  POST-OPERATIVE DIAGNOSIS:  Right Hip Fracture  PROCEDURE:  Procedure(s): Right Hip Hemiarthroplasty  PREOPERATIVE INDICATIONS:  [name] Proctor is an 78 y.o. male who was admitted 08/11/2013 with a diagnosis of Femoral neck fracture and elected for surgical management.  The risks benefits and alternatives were discussed with the patient including but not limited to the risks of nonoperative treatment, versus surgical intervention including infection, bleeding, nerve injury, periprosthetic fracture, the need for revision surgery, dislocation, leg length discrepancy, blood clots, cardiopulmonary complications, morbidity, mortality, among others, and they were willing to proceed.  Predicted outcome is good, although there will be at least a six to nine month expected recovery.   OPERATIVE REPORT     SURGEON:  Margarita Rana, MD    ASSISTANT:  Kingsley Plan OPA he was present for the entirety of the case and participated in on aspect.    ANESTHESIA:  General    COMPLICATIONS:  None.      COMPONENTS:  [employer] Acolade: Femoral stem: 2.5, Femoral Head:48, Neck:0   PROCEDURE IN DETAIL: The patient was met in the holding area and identified.  The appropriate hip  was marked at the operative site. The patient was then transported to the OR and  placed under general anesthesia.  At that point, the patient was  placed in the lateral decubitus position with the operative side up and  secured to the operating room table and all bony prominences padded.     The operative lower extremity was prepped from the iliac crest to the toes.  Sterile draping was performed.  Time out was performed prior to incision.      A routine posterolateral approach was utilized via sharp dissection  carried down to the subcutaneous tissue.  Gross bleeders were Bovie  coagulated.  The iliotibial band  was identified and incised  along the length of the skin incision.  Self-retaining retractors were  inserted.  With the hip internally rotated, the short external rotators  were identified. The piriformis was tagged with FiberWire, and the hip capsule released in a T-type fashion.  The femoral neck was exposed, and I resected the femoral neck using the appropriate jig. This was performed at approximately a thumb's breadth above the lesser trochanter.    I then exposed the deep acetabulum, cleared out any tissue including the ligamentum teres, and included the hip capsule in the FiberWire used above and below the T.    I then prepared the proximal femur using the cookie-cutter, the lateralizing reamer, and then sequentially broached.  A trial utilized, and I reduced the hip and it was found to have excellent stability with functional range of motion. The trial components were then removed.   The canal and acetabulum were thoroughly irrigated  I inserted the pressfit stem and placed the head and neck collar. The hip was reduced with appropriate force and was stable through a range of motion.   I then used a 2 mm drill bits to pass the FiberWire suture from the capsule and puriform is through the greater trochanter, and secured this. Excellent posterior capsular repair was achieved. I also closed the T in the capsule.  I then irrigated the hip copiously again with pulse lavage, and repaired the fascia with Vicryl, followed by Vicryl for the subcutaneous tissue, Monocryl for the skin, Steri-Strips and sterile gauze. The wounds were  injected. The patient was then awakened and returned to PACU in stable and satisfactory condition. There were no complications.  Margarita Rana, MD Orthopedic Surgeon [phone]   08/13/2013 2:04 PM

## 2013-08-13 NOTE — Transfer of Care (Signed)
Immediate Anesthesia Transfer of Care Note  Patient: [name] Proctor  Procedure(s) Performed: Procedure(s): Right Hip Hemiarthroplasty (Right)  Patient Location: PACU  Anesthesia Type:General  Level of Consciousness: awake and patient cooperative  Airway & Oxygen Therapy: Patient Spontanous Breathing and Patient connected to face mask oxygen  Post-op Assessment: Report given to PACU RN and Post -op Vital signs reviewed and stable  Post vital signs: Reviewed and stable  Complications: No apparent anesthesia complications

## 2013-08-13 NOTE — Anesthesia Preprocedure Evaluation (Addendum)
Anesthesia Evaluation  Patient identified by MRN, date of birth, ID band Patient awake    Reviewed: Allergy & Precautions, H&P , NPO status , Patient's Chart, lab work & pertinent test results  Airway Mallampati: I TM Distance: >3 FB Neck ROM: Full    Dental   Pulmonary  breath sounds clear to auscultation        Cardiovascular hypertension, + CAD, + CABG, + Peripheral Vascular Disease and +CHF + dysrhythmias Atrial Fibrillation + pacemaker Rhythm:Irregular Rate:Normal  SSS   Neuro/Psych    GI/Hepatic PUD, gastritis   Endo/Other  diabetesHypothyroidism   Renal/GU Renal InsufficiencyRenal diseaseRenal artery stenosis     Musculoskeletal   Abdominal   Peds  Hematology  (+) Blood dyscrasia, anemia , HIV,   Anesthesia Other Findings Cachectic appearance  Reproductive/Obstetrics                          Anesthesia Physical Anesthesia Plan  ASA: IV  Anesthesia Plan: General   Post-op Pain Management:    Induction: Intravenous  Airway Management Planned: Oral ETT  Additional Equipment:   Intra-op Plan:   Post-operative Plan: Extubation in OR  Informed Consent: I have reviewed the patients History and Physical, chart, labs and discussed the procedure including the risks, benefits and alternatives for the proposed anesthesia with the patient or authorized representative who has indicated his/her understanding and acceptance.     Plan Discussed with: CRNA and Surgeon  Anesthesia Plan Comments:         Anesthesia Quick Evaluation

## 2013-08-13 NOTE — Progress Notes (Signed)
Patient ID: [name] Proctor  male  ZOX:[id number]    DOB: Oct 05, 1935    DOA: 08/11/2013  PCP: Pcp Not In System  Assessment/Plan: Principal Problem:  Acute right femoral neck fracture secondary to mechanical fall - INR under 2 today, awaiting surgery today. Was given vitamin K and bunion and FFP yesterday - Resume Coumadin post op and given significant cardiac hx should bridge with IV heparin or lovenox.  - PT eval after the surgery  CAD status post CABG  - denies any symptoms. Continue Coreg, ASA   Sick sinus syndrome  S/p pacemaker placement stable on telemetry. His cardiologist is Dr Thomasene Lot who can be consulted as needed.   Aflutter  s/p ablation, on coumadin currently held for surgery  CHF  clinically euvolemic. Echo from 2013 with EF of 45%. Holding lasix for now   Diabetes mellitus type 2  N.p.o. for surgery,  Monitor with SSI.   Chronic kidney disease with renal artery stenosis  Follow renal function closely, currently at baseline    Iron def anemia  Recheck CBC after surgery  HIV  follows at [place]. continue ART   Hypothyroidism  continue synthroid   DVT Prophylaxis: Restart Coumadin after surgery  Code Status:  Disposition:    Subjective: No complaints awaiting for surgery  Objective: Weight change:   Intake/Output Summary (Last 24 hours) at 08/13/13 1216 Last data filed at 08/13/13 1200  Gross per 24 hour  Intake   1665 ml  Output   1625 ml  Net     40 ml   Blood pressure 141/76, pulse 64, temperature 98.1 F (36.7 C), temperature source Oral, resp. rate 20, SpO2 98.00%.  Physical Exam: General: Alert and awake, oriented x3, not in any acute distress. CVS: Irregularly irregular  Chest: clear to auscultation bilaterally, no wheezing, rales or rhonchi Abdomen: soft nontender, nondistended, normal bowel sounds  Extremities: no c/c/e bilaterally   Lab Results: Basic Metabolic Panel:  Recent Labs Lab 08/12/13 0430 08/13/13 0535  NA 139 138   K 3.9 4.3  CL 102 102  CO2 23 20  GLUCOSE 66* 218*  BUN 49* 54*  CREATININE 1.78* 1.89*  CALCIUM 9.6 9.3   Liver Function Tests: No results found for this basename: AST, ALT, ALKPHOS, BILITOT, PROT, ALBUMIN,  in the last 168 hours No results found for this basename: LIPASE, AMYLASE,  in the last 168 hours No results found for this basename: AMMONIA,  in the last 168 hours CBC:  Recent Labs Lab 08/11/13 1852 08/12/13 0430  WBC 8.6 9.3  NEUTROABS 6.3  --   HGB 9.6* 9.9*  HCT 29.4* 31.6*  MCV 85.5 86.1  PLT 227 206   Cardiac Enzymes: No results found for this basename: CKTOTAL, CKMB, CKMBINDEX, TROPONINI,  in the last 168 hours BNP: No components found with this basename: POCBNP,  CBG:  Recent Labs Lab 08/12/13 1957 08/12/13 2346 08/13/13 0401 08/13/13 0840 08/13/13 1007  GLUCAP 237* 233* 233* 185* 207*     Micro Results: Recent Results (from the past 240 hour(s))  URINE CULTURE     Status: None   Collection Time    08/12/13 12:21 AM      Result Value Range Status   Specimen Description URINE, CATHETERIZED   Final   Special Requests NONE   Final   Culture  Setup Time     Final   Value: 08/12/2013 05:07     Performed at [facility] Count  Final   Value: NO GROWTH     Performed at [facility]   Culture     Final   Value: NO GROWTH     Performed at [facility]   Report Status 08/13/2013 FINAL   Final  SURGICAL PCR SCREEN     Status: None   Collection Time    08/12/13  6:17 AM      Result Value Range Status   MRSA, PCR NEGATIVE  NEGATIVE Final   Staphylococcus aureus NEGATIVE  NEGATIVE Final   Comment:            The Xpert SA Assay (FDA     approved for NASAL specimens     in patients over 62 years of age),     is one component of     a comprehensive surveillance     program.  Test performance has     been validated by [employer] for patients greater     than or equal to 50 year old.     It is not  intended     to diagnose infection nor to     guide or monitor treatment.    Studies/Results: Dg Chest 1 View  08/11/2013   *RADIOLOGY REPORT*  Clinical Data: Fall, pain over  right  hip  CHEST - 1 VIEW  Comparison: Chest radiograph 07/07/2012  Findings: Left-sided pacemaker overlies enlarged cardiac silhouette.  No effusion, infiltrate, or pneumothorax.  Mild central venous congestion is present.  IMPRESSION: Cardiomegaly mild central venous congestion.   Original Report Authenticated By: Genevive Bi, M.D.   Dg Hip Complete Right  08/11/2013   *RADIOLOGY REPORT*  Clinical Data: Fall, right hip pain  RIGHT HIP - COMPLETE 2+ VIEW  Comparison: None.  Findings: There is an acute fracture of the right femoral neck with varus angulation.  The fractures is either basocervical or sub capital .  There is superior migration of the distal fracture fragment (greater trochanter).  IMPRESSION: Acute right femoral neck fracture.   Original Report Authenticated By: Genevive Bi, M.D.    Medications: Scheduled Meds: . [facility] HOLD] abacavir  600 mg Oral Daily  . [facility] HOLD] allopurinol  400 mg Oral Daily  . [facility] HOLD] aspirin EC  81 mg Oral Daily  . [facility] HOLD] atorvastatin  10 mg Oral Daily  . [facility] HOLD] calcium carbonate  1 tablet Oral BID WC  . [facility] HOLD] carvedilol  25 mg Oral BID WC  . [[facility] HOLD] etravirine  200 mg Oral BID WC  . [[facility] HOLD] hydrALAZINE  25 mg Oral Q8H  . [MAR HOLD] insulin aspart  0-9 Units Subcutaneous TID WC  . [facility] HOLD] lamiVUDine  150 mg Oral Daily  . [facility] HOLD] levothyroxine  50 mcg Oral QAC breakfast  . [MAR HOLD] pantoprazole  40 mg Oral Daily  . [facility] HOLD] sulfamethoxazole-trimethoprim  1 tablet Oral Q M,W,F      LOS: 2 days   Karrington Mccravy M.D. [facility] 08/13/2013, 12:16 PM Pager: [phone]  If 7PM-7AM, please contact night-coverage [email or web address] Password [email or web address]

## 2013-08-13 NOTE — H&P (View-Only) (Signed)
ORTHOPAEDIC CONSULTATION  REQUESTING PHYSICIAN: No att. providers found  Chief Complaint: R femoral neck fracture  HPI: [name] Proctor is a 78 y.o. male who complains of  Ground level fall and R hip pain. He is a houshold/community ambulator with a Medical laboratory scientific officer  Past Medical History  Diagnosis Date  . CHF (congestive heart failure)     a. h/o diastolic dysfunction. b. EF 45% by echo 06/2012.  Marland Kitchen History of diastolic dysfunction   . SSS (sick sinus syndrome)   . Coronary artery disease     s/p CABG 2010  . History of atrial flutter     s/p ablation  . Chronic renal insufficiency   . Diabetes mellitus type 2, insulin dependent   . History of GI bleed   . Gastritis   . Esophagitis   . Duodenal ulcer   . IDA (iron deficiency anemia)   . Hypertension   . Hyperlipidemia   . Hypothyroidism   . HIV positive   . RAS (renal artery stenosis)   . pacemaker-MDT     Medtronic EnRhythm generator serial [id number] H 2007 Dr. Reyes Ivan. Replaced at [facility] 2012     . Atrial fibrillation     By pacemaker interrogation 06/2012 - device reprogrammed    Past Surgical History  Procedure Laterality Date  . Cardiac catheterization  02/22/2009    EF 40-45%  . Coronary angioplasty  03/06/2006    INTRACORONARY STENTING OF SAPHENOUS VEIN GRAFT TO THE PDA AND INTRACORONARY STENTING OF THE SAPHENOUS VEIN GRAFT TO THE DIAGONAL  . Insert / replace / remove pacemaker      IMPLANT  . Ablation saphenous vein w/ rfa    . Renal artery stent      LEFT  . Coronary artery bypass graft  02/2009    REDO. NEW SAPHENOUS VEIN GRAFT TO THE OBTUSE MARGINAL VESSEL AND A SEQUENTIAL SAPHENOUS VEIN GRAFT TO THE PDA AND POSTERIOR LATERAL BRANCHES OF THE RIGHT CORONARY. THE LIMA GRAFT FROM HIS ORIGINAL SURGERY WAS STILL PATENT.  Marland Kitchen Pacemaker generator change  3/12    done at [facility]   History   Social History  . Marital Status: Divorced    Spouse Name: N/A    Number of Children: N/A  . Years of Education: N/A   Social History  Main Topics  . Smoking status: Never Smoker   . Smokeless tobacco: None  . Alcohol Use: No  . Drug Use: No  . Sexual Activity: Not Currently   Other Topics Concern  . None   Social History Narrative   One daughter.  Retired Estate manager/land agent for [employer]   Family History  Problem Relation Age of Onset  . Kidney disease Mother   . Hypertension Father   . Crohn's disease Daughter   . Lupus Sister    Allergies  Allergen Reactions  . Darvocet [Propoxyphene-Acetaminophen] Nausea And Vomiting  . Seldane [Terfenadine] Nausea And Vomiting  . Sulfonylureas Other (See Comments)    Low grade fever  . Ultracet [Tramadol-Acetaminophen] Nausea And Vomiting   Prior to Admission medications   Medication Sig Start Date End Date Taking? Authorizing Provider  abacavir (ZIAGEN) 300 MG tablet Take 300 mg by mouth daily.      Historical Provider, MD  aspirin 81 MG tablet Take 81 mg by mouth daily.      Historical Provider, MD  atorvastatin (LIPITOR) 10 MG tablet Take 10 mg by mouth daily.      Historical Provider, MD  calcium  carbonate (OS-CAL - DOSED IN MG OF ELEMENTAL CALCIUM) 1250 MG tablet Take 2 tablets by mouth daily.    Historical Provider, MD  carvedilol (COREG) 25 MG tablet Take 25 mg by mouth 2 (two) times daily with a meal.      Historical Provider, MD  etravirine (INTELENCE) 100 MG tablet Take 200 mg by mouth 4 (four) times daily.      Historical Provider, MD  furosemide (LASIX) 40 MG tablet Take 1.5 tablets (60 mg total) by mouth 2 (two) times daily. 07/08/12   Dayna N Dunn, PA-C  insulin regular (NOVOLIN R,HUMULIN R) 100 units/mL injection Inject 20 Units into the skin 3 (three) times daily before meals.    Historical Provider, MD  Isopropyl Alcohol (ALCOHOL PREPS EX) Apply topically.      Historical Provider, MD  lamiVUDine (EPIVIR) 150 MG tablet Take 150 mg by mouth daily.    Historical Provider, MD  levothyroxine (SYNTHROID, LEVOTHROID) 50 MCG tablet Take 50 mcg by mouth  daily.    Historical Provider, MD  omeprazole (PRILOSEC) 20 MG capsule Take 20 mg by mouth daily.      Historical Provider, MD  potassium chloride SA (K-DUR,KLOR-CON) 20 MEQ tablet Take 20 mEq by mouth 3 (three) times daily.      Historical Provider, MD  predniSONE (DELTASONE) 5 MG tablet Take 15 mg by mouth daily.    Historical Provider, MD   Dg Chest 1 View  08/11/2013   *RADIOLOGY REPORT*  Clinical Data: Fall, pain over  right  hip  CHEST - 1 VIEW  Comparison: Chest radiograph 07/07/2012  Findings: Left-sided pacemaker overlies enlarged cardiac silhouette.  No effusion, infiltrate, or pneumothorax.  Mild central venous congestion is present.  IMPRESSION: Cardiomegaly mild central venous congestion.   Original Report Authenticated By: Genevive Bi, M.D.   Dg Hip Complete Right  08/11/2013   *RADIOLOGY REPORT*  Clinical Data: Fall, right hip pain  RIGHT HIP - COMPLETE 2+ VIEW  Comparison: None.  Findings: There is an acute fracture of the right femoral neck with varus angulation.  The fractures is either basocervical or sub capital .  There is superior migration of the distal fracture fragment (greater trochanter).  IMPRESSION: Acute right femoral neck fracture.   Original Report Authenticated By: Genevive Bi, M.D.    Positive ROS: All other systems have been reviewed and were otherwise negative with the exception of those mentioned in the HPI and as above.  Physical Exam: Filed Vitals:   08/11/13 2115  BP: 160/84  Pulse: 63  Temp:   Resp: 22   General: Alert, no acute distress Cardiovascular: No pedal edema Respiratory: No cyanosis, no use of accessory musculature GI: No organomegaly, abdomen is soft and non-tender Skin: No lesions in the area of chief complaint Neurologic: Sensation intact distally Psychiatric: Patient is competent for consent with normal mood and affect Lymphatic: No axillary or cervical lymphadenopathy  MUSCULOSKELETAL:  RLE: SILT DP/SP/S/S/T nerve, 2+ DP,  +TA/GS/EHL Compartments soft Pain with any ROM  Other extremities are atraumatic with painless ROM and NVI.  Assessment: Right Femoral neck fracture  Plan: Plan for Right hemiarthroplasty after reversal of coumadin (INR 2.9 currently) Weight Bearing Status: bedrest, will adjust post op PT/OT when appropriate VTE px: SCD's and chemical prophylaxis post op   Margarita Rana, D, MD Cell [phone]   08/11/2013 9:41 PM

## 2013-08-13 NOTE — Progress Notes (Signed)
Orthopedic Tech Progress Note Patient Details:  [name] Proctor 1935-03-02 [id number] Applied overhead frame to bed     Jennye Moccasin 08/13/2013, 5:34 PM

## 2013-08-13 NOTE — Interval H&P Note (Signed)
History and Physical Interval Note:  08/13/2013 7:32 AM  [name] Proctor  has presented today for surgery, with the diagnosis of Right Hip Fracture  The various methods of treatment have been discussed with the patient and family. After consideration of risks, benefits and other options for treatment, the patient has consented to  Procedure(s) with comments: Right Hip Hemiarthroplasty (Right) - Requests to immediately follow 1st case. Lateral on bean bag, [employer]. as a surgical intervention .  The patient's history has been reviewed, patient examined, no change in status, stable for surgery.  I have reviewed the patient's chart and labs.  Questions were answered to the patient's satisfaction.     Doninique Lwin, D

## 2013-08-13 NOTE — Anesthesia Procedure Notes (Signed)
Procedure Name: Intubation Date/Time: 08/13/2013 10:44 AM Performed by: Marni Griffon Pre-anesthesia Checklist: Patient identified, Emergency Drugs available, Suction available and Patient being monitored Patient Re-evaluated:Patient Re-evaluated prior to inductionOxygen Delivery Method: Circle system utilized Preoxygenation: Pre-oxygenation with 100% oxygen Intubation Type: IV induction Ventilation: Mask ventilation without difficulty and Oral airway inserted - appropriate to patient size Laryngoscope Size: Mac and 4 Grade View: Grade II Tube type: Oral Tube size: 7.5 mm Number of attempts: 1 Airway Equipment and Method: Stylet Placement Confirmation: ETT inserted through vocal cords under direct vision,  breath sounds checked- equal and bilateral and positive ETCO2 Secured at: 21 (cm at upper gum) cm Tube secured with: Tape Dental Injury: Teeth and Oropharynx as per pre-operative assessment

## 2013-08-13 NOTE — Progress Notes (Signed)
ANTICOAGULATION CONSULT NOTE - Initial Consult  Pharmacy Consult for Warfarin Indication: s/p hip surgery; Atrial fibrillation  Allergies  Allergen Reactions  . Darvocet [Propoxyphene-Acetaminophen] Nausea And Vomiting  . Seldane [Terfenadine] Nausea And Vomiting  . Sulfonylureas Other (See Comments)    Low grade fever  . Ultracet [Tramadol-Acetaminophen] Nausea And Vomiting   Patient Measurements: Not currently available  Vital Signs: Temp: 97.4 F (36.3 C) (08/20 1524) Temp src: Oral (08/20 1524) BP: 136/71 mmHg (08/20 1524) Pulse Rate: 71 (08/20 1524)  Labs:  Recent Labs  08/11/13 1852 08/12/13 0430 08/12/13 1612 08/13/13 0535  HGB 9.6* 9.9*  --   --   HCT 29.4* 31.6*  --   --   PLT 227 206  --   --   APTT  --  49*  --   --   LABPROT 29.5* 24.0* 20.9* 18.0*  INR 2.93* 2.23* 1.86* 1.53*  CREATININE 1.66* 1.78*  --  1.89*    The CrCl is unknown because both a height and weight (above a minimum accepted value) are required for this calculation.   Medical History: Past Medical History  Diagnosis Date  . CHF (congestive heart failure)     a. h/o diastolic dysfunction. b. EF 45% by echo 06/2012.  Marland Kitchen History of diastolic dysfunction   . SSS (sick sinus syndrome)   . Coronary artery disease     s/p CABG 2010  . History of atrial flutter     s/p ablation  . Chronic renal insufficiency   . Diabetes mellitus type 2, insulin dependent   . History of GI bleed   . Gastritis   . Esophagitis   . Duodenal ulcer   . IDA (iron deficiency anemia)   . Hypertension   . Hyperlipidemia   . Hypothyroidism   . HIV positive   . RAS (renal artery stenosis)   . pacemaker-MDT     Medtronic EnRhythm generator serial [id number] H 2007 Dr. Reyes Ivan. Replaced at [facility] 2012     . Atrial fibrillation     By pacemaker interrogation 06/2012 - device reprogrammed    Medications:  Scheduled:  . abacavir  600 mg Oral Daily  . allopurinol  400 mg Oral Daily  . aspirin EC  81 mg Oral Daily   . atorvastatin  10 mg Oral Daily  . calcium carbonate  1 tablet Oral BID WC  . carvedilol  25 mg Oral BID WC  .  ceFAZolin (ANCEF) IV  2 g Intravenous Q6H  . etravirine  200 mg Oral BID WC  . hydrALAZINE  25 mg Oral Q8H  . insulin aspart  0-9 Units Subcutaneous TID WC  . lamiVUDine  150 mg Oral Daily  . levothyroxine  50 mcg Oral QAC breakfast  . pantoprazole  40 mg Oral Daily  . sulfamethoxazole-trimethoprim  1 tablet Oral Q M,W,F  . warfarin  5 mg Oral Daily   Assessment: [name] on chronic warfarin prior to admission for atrial fibrillation is now s/p hip surgery to resume warfarin. INR on admission was therapeutic at 2.93. INR was reversed with Vitamin K 5mg  IV x1 on 8/19 for surgery. Now INR is 1.53 today. Home warfarin dose was 5mg  po daily per [facility] who manages his warfarin. Note that patient is on chronic Bactrim MWF for prophylaxis which can interact with the INR. No CBC this am. No bleeding reported. No history of stroke found.   Goal of Therapy:  INR 2-3   Plan:  1. Warfarin 6mg  tonight (  higher dose than home due to low INR and recent reversal).  2. PT/INR daily.   Link Snuffer, PharmD, BCPS Clinical Pharmacist [phone]  08/13/2013,3:51 PM

## 2013-08-14 ENCOUNTER — Encounter (HOSPITAL_COMMUNITY): Payer: Self-pay | Admitting: Orthopedic Surgery

## 2013-08-14 DIAGNOSIS — I119 Hypertensive heart disease without heart failure: Secondary | ICD-10-CM

## 2013-08-14 DIAGNOSIS — I495 Sick sinus syndrome: Secondary | ICD-10-CM

## 2013-08-14 DIAGNOSIS — I498 Other specified cardiac arrhythmias: Secondary | ICD-10-CM

## 2013-08-14 LAB — GLUCOSE, CAPILLARY
Glucose-Capillary: 234 mg/dL — ABNORMAL HIGH (ref 70–99)
Glucose-Capillary: 300 mg/dL — ABNORMAL HIGH (ref 70–99)
Glucose-Capillary: 303 mg/dL — ABNORMAL HIGH (ref 70–99)
Glucose-Capillary: 340 mg/dL — ABNORMAL HIGH (ref 70–99)

## 2013-08-14 LAB — BASIC METABOLIC PANEL
CO2: 20 mEq/L (ref 19–32)
Calcium: 9.3 mg/dL (ref 8.4–10.5)
Chloride: 99 mEq/L (ref 96–112)
Potassium: 4.4 mEq/L (ref 3.5–5.1)
Sodium: 134 mEq/L — ABNORMAL LOW (ref 135–145)

## 2013-08-14 LAB — CBC
Hemoglobin: 9.4 g/dL — ABNORMAL LOW (ref 13.0–17.0)
Platelets: 184 10*3/uL (ref 150–400)
RBC: 3.47 MIL/uL — ABNORMAL LOW (ref 4.22–5.81)
WBC: 9 10*3/uL (ref 4.0–10.5)

## 2013-08-14 LAB — PROTIME-INR
INR: 1.47 (ref 0.00–1.49)
Prothrombin Time: 17.4 seconds — ABNORMAL HIGH (ref 11.6–15.2)

## 2013-08-14 MED ORDER — INSULIN GLARGINE 100 UNIT/ML ~~LOC~~ SOLN
10.0000 [IU] | Freq: Every day | SUBCUTANEOUS | Status: DC
  Administered 2013-08-14: 10 [IU] via SUBCUTANEOUS
  Filled 2013-08-14: qty 0.1

## 2013-08-14 MED ORDER — WARFARIN SODIUM 6 MG PO TABS
6.0000 mg | ORAL_TABLET | Freq: Once | ORAL | Status: AC
  Administered 2013-08-14: 6 mg via ORAL
  Filled 2013-08-14: qty 1

## 2013-08-14 MED ORDER — INSULIN ASPART 100 UNIT/ML ~~LOC~~ SOLN
4.0000 [IU] | Freq: Once | SUBCUTANEOUS | Status: AC
  Administered 2013-08-14: 4 [IU] via SUBCUTANEOUS

## 2013-08-14 MED ORDER — INSULIN ASPART 100 UNIT/ML ~~LOC~~ SOLN
4.0000 [IU] | Freq: Three times a day (TID) | SUBCUTANEOUS | Status: DC
  Administered 2013-08-14 – 2013-08-15 (×3): 4 [IU] via SUBCUTANEOUS

## 2013-08-14 MED ORDER — INSULIN ASPART 100 UNIT/ML ~~LOC~~ SOLN
0.0000 [IU] | Freq: Every day | SUBCUTANEOUS | Status: DC
  Administered 2013-08-14: 2 [IU] via SUBCUTANEOUS

## 2013-08-14 MED ORDER — INSULIN ASPART 100 UNIT/ML ~~LOC~~ SOLN
0.0000 [IU] | Freq: Three times a day (TID) | SUBCUTANEOUS | Status: DC
  Administered 2013-08-14: 8 [IU] via SUBCUTANEOUS
  Administered 2013-08-15: 5 [IU] via SUBCUTANEOUS
  Administered 2013-08-15: 3 [IU] via SUBCUTANEOUS

## 2013-08-14 NOTE — Progress Notes (Signed)
Patient ID: [name] Proctor  male  OZH:[id number]    DOB: 02/26/1935    DOA: 08/11/2013  PCP: Pcp Not In System  Assessment/Plan: Principal Problem:  Acute right femoral neck fracture secondary to mechanical fall - Postop day 1, status post right hip hemiarthroplasty - Started Coumadin - PT eval after the surgery-> skilled nursing facility  CAD status post CABG  - denies any symptoms. Continue Coreg, ASA   Sick sinus syndrome  S/p pacemaker placement stable on telemetry (cardiologist is Dr Thomasene Lot)  Aflutter  s/p ablation, on coumadin  CHF  clinically euvolemic. Echo from 2013 with EF of 45%. Holding lasix for now   Diabetes mellitus type 2 - poorly controlled  Continue sliding scale insulin, change to moderate  - Added 4 units meal coverage  -  added Lantus 10 units qhs - Will have pharmacy verify his insulin doses at home   Chronic kidney disease with renal artery stenosis  Follow renal function closely, currently at baseline    Iron def anemia  H/h is stable, follow CBC   HIV  follows at [place]. continue ART   Hypothyroidism  continue synthroid   DVT Prophylaxis: coumadin per pharmacy  Code Status:  Disposition:    Subjective:  no complaints, awaiting for physical therapy. Pain controlled   Objective: Weight change:   Intake/Output Summary (Last 24 hours) at 08/14/13 1316 Last data filed at 08/14/13 0600  Gross per 24 hour  Intake   2360 ml  Output    900 ml  Net   1460 ml   Blood pressure 136/70, pulse 66, temperature 98.7 F (37.1 C), temperature source Oral, resp. rate 20, height 6' 2.02" (1.88 m), weight 74.1 kg (163 lb 5.8 oz), SpO2 99.00%.  Physical Exam: General: Alert and awake, oriented x3, not in any acute distress. CVS: Irregularly irregular  Chest: CTAB Abdomen: soft NT, ND, NBS  Extremities: no c/c/e bilaterally   Lab Results: Basic Metabolic Panel:  Recent Labs Lab 08/13/13 0535 08/14/13 0500  NA 138 134*  K 4.3 4.4  CL 102 99   CO2 20 20  GLUCOSE 218* 314*  BUN 54* 59*  CREATININE 1.89* 1.99*  CALCIUM 9.3 9.3   CBC:  Recent Labs Lab 08/11/13 1852 08/12/13 0430 08/14/13 0500  WBC 8.6 9.3 9.0  NEUTROABS 6.3  --   --   HGB 9.6* 9.9* 9.4*  HCT 29.4* 31.6* 29.4*  MCV 85.5 86.1 84.7  PLT 227 206 184   Cardiac Enzymes: No results found for this basename: CKTOTAL, CKMB, CKMBINDEX, TROPONINI,  in the last 168 hours BNP: No components found with this basename: POCBNP,  CBG:  Recent Labs Lab 08/13/13 1955 08/14/13 0008 08/14/13 0425 08/14/13 0751 08/14/13 1102  GLUCAP 308* 340* 303* 290* 327*     Micro Results: Recent Results (from the past 240 hour(s))  URINE CULTURE     Status: None   Collection Time    08/12/13 12:21 AM      Result Value Range Status   Specimen Description URINE, CATHETERIZED   Final   Special Requests NONE   Final   Culture  Setup Time     Final   Value: 08/12/2013 05:07     Performed at [facility] Count     Final   Value: NO GROWTH     Performed at [facility]   Culture     Final   Value: NO GROWTH     Performed  at [facility]   Report Status 08/13/2013 FINAL   Final  SURGICAL PCR SCREEN     Status: None   Collection Time    08/12/13  6:17 AM      Result Value Range Status   MRSA, PCR NEGATIVE  NEGATIVE Final   Staphylococcus aureus NEGATIVE  NEGATIVE Final   Comment:            The Xpert SA Assay (FDA     approved for NASAL specimens     in patients over 34 years of age),     is one component of     a comprehensive surveillance     program.  Test performance has     been validated by [employer] for patients greater     than or equal to 64 year old.     It is not intended     to diagnose infection nor to     guide or monitor treatment.    Studies/Results: Dg Chest 1 View  08/11/2013   *RADIOLOGY REPORT*  Clinical Data: Fall, pain over  right  hip  CHEST - 1 VIEW  Comparison: Chest radiograph 07/07/2012   Findings: Left-sided pacemaker overlies enlarged cardiac silhouette.  No effusion, infiltrate, or pneumothorax.  Mild central venous congestion is present.  IMPRESSION: Cardiomegaly mild central venous congestion.   Original Report Authenticated By: Genevive Bi, M.D.   Dg Hip Complete Right  08/11/2013   *RADIOLOGY REPORT*  Clinical Data: Fall, right hip pain  RIGHT HIP - COMPLETE 2+ VIEW  Comparison: None.  Findings: There is an acute fracture of the right femoral neck with varus angulation.  The fractures is either basocervical or sub capital .  There is superior migration of the distal fracture fragment (greater trochanter).  IMPRESSION: Acute right femoral neck fracture.   Original Report Authenticated By: Genevive Bi, M.D.    Medications: Scheduled Meds: . abacavir  600 mg Oral Daily  . allopurinol  400 mg Oral Daily  . aspirin EC  81 mg Oral Daily  . atorvastatin  10 mg Oral Daily  . calcium carbonate  1 tablet Oral BID WC  . carvedilol  25 mg Oral BID WC  . etravirine  200 mg Oral BID WC  . hydrALAZINE  25 mg Oral Q8H  . insulin aspart  0-9 Units Subcutaneous TID WC  . lamiVUDine  150 mg Oral Daily  . levothyroxine  50 mcg Oral QAC breakfast  . pantoprazole  40 mg Oral Daily  . sulfamethoxazole-trimethoprim  1 tablet Oral Q M,W,F  . warfarin  6 mg Oral ONCE-1800  . Warfarin - Pharmacist Dosing Inpatient   Does not apply q1800      LOS: 3 days   Doratha Mcswain M.D. [facility] 08/14/2013, 1:16 PM Pager: [phone]  If 7PM-7AM, please contact night-coverage [email or web address] Password [email or web address]

## 2013-08-14 NOTE — Clinical Social Work Placement (Addendum)
Clinical Social Work Department  CLINICAL SOCIAL WORK PLACEMENT NOTE  08/14/2013 Patient: [name] Proctor  Account Number: [id number] Admit date: 08/12/13 Clinical Social Worker: Sabino Niemann MSW Date/time: 08/14/2013 11:30 AM  Clinical Social Work is seeking post-discharge placement for this patient at the following level of care: SKILLED NURSING (*CSW will update this form in Epic as items are completed)  08/14/2013 Patient/family provided with [facility] Department of Clinical Social Work's list of facilities offering this level of care within the geographic area requested by the patient (or if unable, by the patient's family).  08/14/2013 Patient/family informed of their freedom to choose among providers that offer the needed level of care, that participate in Medicare, Medicaid or managed care program needed by the patient, have an available bed and are willing to accept the patient.  08/14/2013 Patient/family informed of [facility]' ownership interest in [facility], as well as of the fact that they are under no obligation to receive care at this facility.  PASARR submitted to EDS on  Pre-existing PASARR number received from EDS on  FL2 transmitted to all facilities in geographic area requested by pt/family on 08/14/2013 FL2 transmitted to all facilities within larger geographic area on  Patient informed that his/her managed care company has contracts with or will negotiate with certain facilities, including the following:  Patient/family informed of bed offers received: 08/15/2013 Patient chooses bed at [facility] Physician recommends and patient chooses bed at  Patient to be transferred to on 08/15/2013 Patient to be transferred to facility by [facility] The following physician request were entered in Epic:  Additional Comments:

## 2013-08-14 NOTE — Progress Notes (Signed)
SW faxed clinicals into [place] [place]. Valeria Royster called and reported that the patient had been denied.  Sabino Niemann, MSW, [phone]

## 2013-08-14 NOTE — Evaluation (Signed)
Physical Therapy Evaluation Patient Details Name: [name] Proctor MRN: [id number] DOB: [birth year] Today's Date: 08/14/2013 Time: 4098-1191 PT Time Calculation (min): 36 min  PT Assessment / Plan / Recommendation History of Present Illness  Pt. fell in yard, sustaining R femoral neck fx. and underwent R hip hemiarthroplasty.  He has hx. of  fibromyalgia, CAD,, CHF, a-flutter,, diabetes, CKD,, HIV, anemia, arthritis.  Recent stay at [facility] for rehab due to his fibramyalgia and associated weakness, per pt.  Clinical Impression  Pt. Presents with significant weakness and dependencies in functional mobility and gait.  Needs acute PT to begin to address these issues.  Will need ST SNF for further rehab before he will be able to consider DC back home.    PT Assessment  Patient needs continued PT services    Follow Up Recommendations  SNF;Supervision/Assistance - 24 hour;Supervision for mobility/OOB    Does the patient have the potential to tolerate intense rehabilitation      Barriers to Discharge Decreased caregiver support      Equipment Recommendations  None recommended by PT    Recommendations for Other Services     Frequency Min 5X/week    Precautions / Restrictions Precautions Precautions: Posterior Hip;Fall Precaution Booklet Issued: Yes (comment) Precaution Comments: educated pt in post hip precautions Required Braces or Orthoses: Other Brace/Splint Other Brace/Splint: hip abduction pillow Restrictions Weight Bearing Restrictions: Yes RLE Weight Bearing: Weight bearing as tolerated   Pertinent Vitals/Pain See vitals tab       Mobility  Bed Mobility Bed Mobility: Supine to Sit;Sitting - Scoot to Edge of Bed;Sit to Supine Supine to Sit: 1: +2 Total assist Supine to Sit: Patient Percentage: 30% Sitting - Scoot to Edge of Bed: 1: +2 Total assist Sitting - Scoot to Edge of Bed: Patient Percentage: 0% Sit to Supine: Not Tested (comment) Details for Bed Mobility  Assistance: assist for R LE and to right trunk Transfers Transfers: Sit to Stand;Stand to Sit Sit to Stand: 1: +2 Total assist Sit to Stand: Patient Percentage: 30% Stand to Sit: 1: +2 Total assist Stand to Sit: Patient Percentage: 30% Details for Transfer Assistance: verbal cues for hand placement, sequencing, and to advance R foot Ambulation/Gait Ambulation/Gait Assistance: 1: +2 Total assist Ambulation/Gait: Patient Percentage: 40% Ambulation Distance (Feet): 3 Feet Assistive device: Rolling walker Ambulation/Gait Assistance Details: heavy assist to unweight one leg to move the other; heavy assist to advance R LE Gait Pattern: Step-to pattern;Trunk flexed Stairs: No    Exercises General Exercises - Lower Extremity Ankle Circles/Pumps: AROM;Both;10 reps Long Arc Quad: AROM;5 reps;Right   PT Diagnosis: Difficulty walking;Abnormality of gait;Acute pain  PT Problem List: Decreased strength;Decreased activity tolerance;Decreased balance;Decreased mobility;Decreased knowledge of use of DME;Decreased knowledge of precautions;Pain PT Treatment Interventions: DME instruction;Gait training;Functional mobility training;Therapeutic activities;Therapeutic exercise;Balance training;Patient/family education     PT Goals(Current goals can be found in the care plan section) Acute Rehab PT Goals Patient Stated Goal: rehab and return home PT Goal Formulation: With patient Time For Goal Achievement: 08/21/13 Potential to Achieve Goals: Fair  Visit Information  Last PT Received On: 08/14/13 Assistance Needed: +2 PT/OT Co-Evaluation/Treatment: Yes History of Present Illness: Pt. fell in yard, sustaining R femoral neck fx. and underwent R hip hemiarthroplasty.  He has hx. of  fibromyalgia, CAD,, CHF, a-flutter,, diabetes, CKD,, HIV, anemia, arthritis.  Recent stay at [facility] for rehab due to his fibramyalgia and associated weakness, per pt.       Prior Functioning  Home  Living Family/patient  expects to be discharged to:: Private residence Living Arrangements: Non-relatives/Friends Available Help at Discharge: Available 24 hours/day Type of Home: House Home Access: Ramped entrance Home Layout: One level Home Equipment: Environmental consultant - 2 wheels;Walker - 4 wheels;Cane - single point;Shower seat;Wheelchair - manual Prior Function Level of Independence: Independent with assistive device(s) Communication Communication: No difficulties Dominant Hand: Right    Cognition  Cognition Arousal/Alertness: Awake/alert Behavior During Therapy: WFL for tasks assessed/performed Overall Cognitive Status: Within Functional Limits for tasks assessed    Extremity/Trunk Assessment Upper Extremity Assessment Upper Extremity Assessment: Generalized weakness Lower Extremity Assessment Lower Extremity Assessment: RLE deficits/detail RLE Deficits / Details: good ankle pump, fair quad set Cervical / Trunk Assessment Cervical / Trunk Assessment: Kyphotic   Balance Balance Balance Assessed: Yes Static Sitting Balance Static Sitting - Balance Support: Feet supported Static Sitting - Level of Assistance: 7: Independent Static Sitting - Comment/# of Minutes: 10  End of Session PT - End of Session Equipment Utilized During Treatment: Gait belt Activity Tolerance: Patient limited by fatigue;Patient limited by pain Patient left: in chair;with call bell/phone within reach Nurse Communication: Mobility status;Weight bearing status;Other (comment) (WB status/precautions posted in room)  GP     Ferman Hamming 08/14/2013, 12:11 PM Weldon Picking PT Acute Rehab Services [phone] Beeper [phone]

## 2013-08-14 NOTE — Progress Notes (Signed)
Subjective:  Patient reports pain as mild  Objective:   VITALS:   Filed Vitals:   08/14/13 0850 08/14/13 1100 08/14/13 1200 08/14/13 1430  BP:    162/81  Pulse:    71  Temp:    97.5 F (36.4 C)  TempSrc:      Resp: 16  20 18   Height:  6' 2.02" (1.88 m)    Weight:  74.1 kg (163 lb 5.8 oz)    SpO2: 100%  99% 99%    Physical Exam  Dressing: C/D/I  Compartments soft  SILT DP/SP/S/S/T, 2+DP, +TA/GS/EHL  LABS  Results for orders placed during the hospital encounter of 08/11/13 (from the past 24 hour(s))  GLUCOSE, CAPILLARY     Status: Abnormal   Collection Time    08/13/13  7:55 PM      Result Value Range   Glucose-Capillary 308 (*) 70 - 99 mg/dL  GLUCOSE, CAPILLARY     Status: Abnormal   Collection Time    08/14/13 12:08 AM      Result Value Range   Glucose-Capillary 340 (*) 70 - 99 mg/dL  GLUCOSE, CAPILLARY     Status: Abnormal   Collection Time    08/14/13  4:25 AM      Result Value Range   Glucose-Capillary 303 (*) 70 - 99 mg/dL  CBC     Status: Abnormal   Collection Time    08/14/13  5:00 AM      Result Value Range   WBC 9.0  4.0 - 10.5 K/uL   RBC 3.47 (*) 4.22 - 5.81 MIL/uL   Hemoglobin 9.4 (*) 13.0 - 17.0 g/dL   HCT 09.8 (*) 11.9 - 14.7 %   MCV 84.7  78.0 - 100.0 fL   MCH 27.1  26.0 - 34.0 pg   MCHC 32.0  30.0 - 36.0 g/dL   RDW 82.9 (*) 56.2 - 13.0 %   Platelets 184  150 - 400 K/uL  BASIC METABOLIC PANEL     Status: Abnormal   Collection Time    08/14/13  5:00 AM      Result Value Range   Sodium 134 (*) 135 - 145 mEq/L   Potassium 4.4  3.5 - 5.1 mEq/L   Chloride 99  96 - 112 mEq/L   CO2 20  19 - 32 mEq/L   Glucose, Bld 314 (*) 70 - 99 mg/dL   BUN 59 (*) 6 - 23 mg/dL   Creatinine, Ser 8.65 (*) 0.50 - 1.35 mg/dL   Calcium 9.3  8.4 - 78.4 mg/dL   GFR calc non Af Amer 31 (*) >90 mL/min   GFR calc Af Amer 36 (*) >90 mL/min  PROTIME-INR     Status: Abnormal   Collection Time    08/14/13  5:00 AM      Result Value Range   Prothrombin Time 17.4  (*) 11.6 - 15.2 seconds   INR 1.47  0.00 - 1.49  GLUCOSE, CAPILLARY     Status: Abnormal   Collection Time    08/14/13  7:51 AM      Result Value Range   Glucose-Capillary 290 (*) 70 - 99 mg/dL   Comment 1 Documented in Chart     Comment 2 Notify RN    GLUCOSE, CAPILLARY     Status: Abnormal   Collection Time    08/14/13 11:02 AM      Result Value Range   Glucose-Capillary 327 (*) 70 - 99 mg/dL  Comment 1 Notify RN     Comment 2 Documented in Chart    GLUCOSE, CAPILLARY     Status: Abnormal   Collection Time    08/14/13  4:47 PM      Result Value Range   Glucose-Capillary 300 (*) 70 - 99 mg/dL   Comment 1 Documented in Chart     Comment 2 Notify RN       Assessment/Plan: 1 Day Post-Op   Principal Problem:   Femoral neck fracture Active Problems:   SSS (sick sinus syndrome)   Coronary artery disease   Chronic kidney disease stage 4   Type 2 diabetes mellitus with vascular disease   HIV disease   Anemia   PLAN: Weight Bearing: WBAT Dressings: change to dry dressing PRN VTE prophylaxis: SCD and chemical per hospitalist team Dispo: snf vs air  F/u with me in 2 wks   Damyan Corne, D 08/14/2013, 5:50 PM   Margarita Rana, MD Cell [phone]

## 2013-08-14 NOTE — Evaluation (Signed)
Occupational Therapy Evaluation Patient Details Name: [name] Proctor MRN: [id number] DOB: 1935-09-22 Today's Date: 08/14/2013 Time: 7829-5621 OT Time Calculation (min): 33 min  OT Assessment / Plan / Recommendation History of present illness Pt fell in backyard and sustained R femoral neck fx, s/p R hemiarthroplasty.  PMH: fibromyalgia, CAD, CHF, a-flutter, DM, CKD, HIV, anemia, arthritis.   Clinical Impression   Pt presents with post surgical pain, generalized weakness, and +2 dependence in mobility.  Will need post acute rehab in a SNF setting.  Will defer ADL education to SNF.   OT Assessment  All further OT needs can be met in the next venue of care    Follow Up Recommendations  SNF    Barriers to Discharge      Equipment Recommendations  None recommended by OT    Recommendations for Other Services    Frequency       Precautions / Restrictions Precautions Precautions: Posterior Hip;Fall Precaution Booklet Issued: Yes (comment) Precaution Comments: educated pt in post hip precautions Restrictions Weight Bearing Restrictions: Yes RLE Weight Bearing: Weight bearing as tolerated   Pertinent Vitals/Pain R hip, did not rate, premedicated, iced, repositioned, RN made aware.    ADL  Eating/Feeding: Independent Where Assessed - Eating/Feeding: Bed level Grooming: Wash/dry hands;Wash/dry face;Set up Where Assessed - Grooming: Unsupported sitting Upper Body Bathing: Set up Where Assessed - Upper Body Bathing: Unsupported sitting Lower Body Bathing: +1 Total assistance Where Assessed - Lower Body Bathing: Unsupported sitting;Supported sit to stand Upper Body Dressing: Set up Where Assessed - Upper Body Dressing: Unsupported sitting Lower Body Dressing: +1 Total assistance Where Assessed - Lower Body Dressing: Unsupported sitting;Supported sit to stand Toilet Transfer: +2 Total assistance Toilet Transfer: Patient Percentage: 30% Toilet Transfer Method: Sit to  stand Equipment Used: Gait belt;Rolling walker Transfers/Ambulation Related to ADLs: +2 total ass    OT Diagnosis: Generalized weakness;Acute pain  OT Problem List: Decreased strength;Decreased activity tolerance;Impaired balance (sitting and/or standing);Decreased knowledge of use of DME or AE;Impaired UE functional use;Pain OT Treatment Interventions:     OT Goals(Current goals can be found in the care plan section) Acute Rehab OT Goals Patient Stated Goal: rehab and return home  Visit Information  Last OT Received On: 08/14/13 Assistance Needed: +2 PT/OT Co-Evaluation/Treatment: Yes History of Present Illness: Pt fell in backyard and sustained R femoral neck fx, s/p R hemiarthroplasty.  PMH: fibromyalgia, CAD, CHF, a-flutter, DM, CKD, HIV, anemia, arthritis.       Prior Functioning     Home Living Family/patient expects to be discharged to:: Private residence Living Arrangements: Non-relatives/Friends Available Help at Discharge: Available 24 hours/day;Friend(s) Type of Home: House Home Access: Ramped entrance Home Layout: One level Home Equipment: Environmental consultant - 2 wheels;Walker - 4 wheels;Cane - single point;Shower seat;Wheelchair - manual Prior Function Level of Independence: Independent with assistive device(s) Communication Communication: No difficulties Dominant Hand: Right         Vision/Perception Vision - History Baseline Vision: Wears glasses only for reading Patient Visual Report: No change from baseline   Cognition  Cognition Arousal/Alertness: Awake/alert Behavior During Therapy: WFL for tasks assessed/performed Overall Cognitive Status: Within Functional Limits for tasks assessed    Extremity/Trunk Assessment Upper Extremity Assessment Upper Extremity Assessment: Generalized weakness Lower Extremity Assessment Lower Extremity Assessment: RLE deficits/detail     Mobility Bed Mobility Bed Mobility: Supine to Sit;Sitting - Scoot to Edge of Bed Supine  to Sit: 1: +2 Total assist Supine to Sit: Patient Percentage: 30% Sitting - Scoot to [place]  of Bed: 1: +2 Total assist Sitting - Scoot to Edge of Bed: Patient Percentage: 0% Details for Bed Mobility Assistance: assist for R LE and to right trunk Transfers Transfers: Sit to Stand;Stand to Sit Sit to Stand: 1: +2 Total assist;With upper extremity assist;From elevated surface;From bed Sit to Stand: Patient Percentage: 30% Stand to Sit: 1: +2 Total assist;With upper extremity assist;To chair/3-in-1 Stand to Sit: Patient Percentage: 30% Details for Transfer Assistance: verbal cues for hand placement, sequencing, and to advance R foot     Exercise     Balance Balance Balance Assessed: Yes Static Sitting Balance Static Sitting - Balance Support: Feet supported Static Sitting - Level of Assistance: 7: Independent Static Sitting - Comment/# of Minutes: 10   End of Session OT - End of Session Activity Tolerance: Patient limited by pain Patient left: in chair;with call bell/phone within reach Nurse Communication: Mobility status;Patient requests pain meds  GO     Evern Bio 08/14/2013, 11:21 AM [phone]

## 2013-08-14 NOTE — Progress Notes (Signed)
ANTICOAGULATION CONSULT NOTE - Follow Up Consult  Pharmacy Consult:  Coumadin  Indication:   Afib + VTE prophylaxis s/p THA  Allergies  Allergen Reactions  . Darvocet [Propoxyphene-Acetaminophen] Nausea And Vomiting  . Seldane [Terfenadine] Nausea And Vomiting  . Sulfonylureas Other (See Comments)    Low grade fever  . Ultracet [Tramadol-Acetaminophen] Nausea And Vomiting    Patient Measurements: Height: 6' 2.02" (188 cm) Weight: 163 lb 5.8 oz (74.1 kg) IBW/kg (Calculated) : 82.24  Vital Signs: Temp: 98.7 F (37.1 C) (08/21 0556) Temp src: Oral (08/21 0556) BP: 136/70 mmHg (08/21 0556) Pulse Rate: 66 (08/21 0556)  Labs:  Recent Labs  08/11/13 1852 08/12/13 0430 08/12/13 1612 08/13/13 0535 08/14/13 0500  HGB 9.6* 9.9*  --   --  9.4*  HCT 29.4* 31.6*  --   --  29.4*  PLT 227 206  --   --  184  APTT  --  49*  --   --   --   LABPROT 29.5* 24.0* 20.9* 18.0* 17.4*  INR 2.93* 2.23* 1.86* 1.53* 1.47  CREATININE 1.66* 1.78*  --  1.89* 1.99*    Estimated Creatinine Clearance: 33.1 ml/min (by C-G formula based on Cr of 1.99).      Assessment: [name] to continue on Coumadin for history of atrial fibrillation and for VTE prophylaxis s/p THA.  INR sub-therapeutic as expected.  No bleeding reported.  Noted patient is on Septra for PCP prophylaxis, which could increase the effect of Coumadin.   Goal of Therapy:  INR 2-3 Monitor platelets by anticoagulation protocol: Yes    Plan:  - Repeat Coumadin 6mg  PO today - Daily PT / INR - Consider resuming home NPH for better glycemic control - F/U HIV med accuracy    Mance Vallejo D. Laney Potash, PharmD, BCPS Pager:  [phone] 08/14/2013, 11:31 AM

## 2013-08-15 DIAGNOSIS — M797 Fibromyalgia: Secondary | ICD-10-CM | POA: Diagnosis present

## 2013-08-15 DIAGNOSIS — I2510 Atherosclerotic heart disease of native coronary artery without angina pectoris: Secondary | ICD-10-CM

## 2013-08-15 DIAGNOSIS — IMO0002 Reserved for concepts with insufficient information to code with codable children: Secondary | ICD-10-CM

## 2013-08-15 LAB — TYPE AND SCREEN
Antibody Screen: NEGATIVE
Unit division: 0

## 2013-08-15 LAB — CBC
Hemoglobin: 9.1 g/dL — ABNORMAL LOW (ref 13.0–17.0)
Platelets: 190 10*3/uL (ref 150–400)
RBC: 3.37 MIL/uL — ABNORMAL LOW (ref 4.22–5.81)
WBC: 9.6 10*3/uL (ref 4.0–10.5)

## 2013-08-15 LAB — BASIC METABOLIC PANEL
CO2: 23 mEq/L (ref 19–32)
Chloride: 100 mEq/L (ref 96–112)
Glucose, Bld: 178 mg/dL — ABNORMAL HIGH (ref 70–99)
Sodium: 134 mEq/L — ABNORMAL LOW (ref 135–145)

## 2013-08-15 LAB — GLUCOSE, CAPILLARY
Glucose-Capillary: 178 mg/dL — ABNORMAL HIGH (ref 70–99)
Glucose-Capillary: 227 mg/dL — ABNORMAL HIGH (ref 70–99)

## 2013-08-15 LAB — PROTIME-INR
INR: 1.74 — ABNORMAL HIGH (ref 0.00–1.49)
Prothrombin Time: 19.8 seconds — ABNORMAL HIGH (ref 11.6–15.2)

## 2013-08-15 MED ORDER — SENNOSIDES-DOCUSATE SODIUM 8.6-50 MG PO TABS
1.0000 | ORAL_TABLET | Freq: Two times a day (BID) | ORAL | Status: DC
  Administered 2013-08-15: 1 via ORAL
  Filled 2013-08-15: qty 1

## 2013-08-15 MED ORDER — WARFARIN SODIUM 5 MG PO TABS
5.0000 mg | ORAL_TABLET | Freq: Once | ORAL | Status: DC
  Filled 2013-08-15: qty 1

## 2013-08-15 MED ORDER — FUROSEMIDE 40 MG PO TABS: 40.0000 mg | ORAL_TABLET | Freq: Every day | ORAL | Status: DC

## 2013-08-15 MED ORDER — HYDROCODONE-ACETAMINOPHEN 5-325 MG PO TABS: 1.0000 | ORAL_TABLET | Freq: Four times a day (QID) | ORAL | Status: DC | PRN

## 2013-08-15 MED ORDER — POLYETHYLENE GLYCOL 3350 17 G PO PACK
17.0000 g | PACK | Freq: Once | ORAL | Status: AC
  Administered 2013-08-15: 17 g via ORAL
  Filled 2013-08-15 (×2): qty 1

## 2013-08-15 MED ORDER — PREDNISONE 10 MG PO TABS: 10.0000 mg | ORAL_TABLET | Freq: Every day | ORAL | Status: DC

## 2013-08-15 MED ORDER — INSULIN GLARGINE 100 UNIT/ML ~~LOC~~ SOLN
13.0000 [IU] | Freq: Every day | SUBCUTANEOUS | Status: DC
  Filled 2013-08-15: qty 0.13

## 2013-08-15 MED ORDER — BISACODYL 10 MG RE SUPP
10.0000 mg | Freq: Once | RECTAL | Status: AC
  Administered 2013-08-15: 10 mg via RECTAL
  Filled 2013-08-15: qty 1

## 2013-08-15 MED ORDER — PREDNISONE 10 MG PO TABS
10.0000 mg | ORAL_TABLET | Freq: Every day | ORAL | Status: DC
  Administered 2013-08-15: 10 mg via ORAL
  Filled 2013-08-15 (×2): qty 1

## 2013-08-15 NOTE — Progress Notes (Signed)
Subjective: 2 Days Post-Op Procedure(s) (LRB): Right Hip Hemiarthroplasty (Right) Patient reports pain as moderate.   Denies nausea or emesis  Objective: Vital signs in last 24 hours: Temp:  [97.5 F (36.4 C)-97.8 F (36.6 C)] 97.6 F (36.4 C) (08/22 0536) Pulse Rate:  [64-71] 65 (08/22 0536) Resp:  [18-20] 20 (08/22 0731) BP: (121-162)/(55-81) 132/81 mmHg (08/22 0536) SpO2:  [98 %-99 %] 98 % (08/22 0536) Weight:  [74.1 kg (163 lb 5.8 oz)] 74.1 kg (163 lb 5.8 oz) (08/21 1100)  Intake/Output from previous day: 08/21 0701 - 08/22 0700 In: 1700 [P.O.:960; I.V.:740] Out: 1200 [Urine:1200] Intake/Output this shift: Total I/O In: 240 [P.O.:240] Out: -    Recent Labs  08/14/13 0500 08/15/13 0530  HGB 9.4* 9.1*    Recent Labs  08/14/13 0500 08/15/13 0530  WBC 9.0 9.6  RBC 3.47* 3.37*  HCT 29.4* 28.8*  PLT 184 190    Recent Labs  08/14/13 0500 08/15/13 0530  NA 134* 134*  K 4.4 4.1  CL 99 100  CO2 20 23  BUN 59* 59*  CREATININE 1.99* 1.75*  GLUCOSE 314* 178*  CALCIUM 9.3 9.3    Recent Labs  08/14/13 0500 08/15/13 0530  INR 1.47 1.74*    Sensation intact distally Intact pulses distally Dorsiflexion/Plantar flexion intact Thigh soft  Assessment/Plan: 2 Days Post-Op Procedure(s) (LRB): Right Hip Hemiarthroplasty (Right) Advance diet Up with therapy D/C IV fluids Plans underway for SNF placement  [name] Proctor B 08/15/2013, 8:54 AM

## 2013-08-15 NOTE — Progress Notes (Signed)
RN called report [facility].  All questions answered.  Patient had a small bowel movement prior to discharge.

## 2013-08-15 NOTE — Progress Notes (Signed)
Physical Therapy Treatment Patient Details Name: [name] Proctor MRN: [id number] DOB: 01-03-1935 Today's Date: 08/15/2013 Time: 4098-1191 PT Time Calculation (min): 24 min  PT Assessment / Plan / Recommendation  History of Present Illness Pt. fell in yard, sustaining R femoral neck fx. and underwent R hip hemiarthroplasty.  He has hx. of  fibromyalgia, CAD,, CHF, a-flutter,, diabetes, CKD,, HIV, anemia, arthritis.  Recent stay at [facility] for rehab due to his fibramyalgia and associated weakness, per pt.   PT Comments   Pt. Appears weaker today and did not do as well as yesterday.  Alerted RN Maralyn Sago that pt. Will need increased assist from staff to assist pt. Back to bed.  Due to his posterior hip precautions, it is not ideal to use lift equipment due to risk of breaking precautions.  Follow Up Recommendations  SNF;Supervision/Assistance - 24 hour;Supervision for mobility/OOB     Does the patient have the potential to tolerate intense rehabilitation     Barriers to Discharge        Equipment Recommendations  None recommended by PT    Recommendations for Other Services    Frequency Min 5X/week   Progress towards PT Goals Progress towards PT goals: Not progressing toward goals - comment (pt. appears weaker today)  Plan Current plan remains appropriate    Precautions / Restrictions Precautions Precautions: Posterior Hip;Fall Precaution Comments: reviewed posterior hip precautions; pt. able to recall 1/3 Required Braces or Orthoses: Other Brace/Splint Other Brace/Splint: hip abduction pillow Restrictions Weight Bearing Restrictions: Yes RLE Weight Bearing: Weight bearing as tolerated   Pertinent Vitals/Pain See vitals tab     Mobility  Bed Mobility Bed Mobility: Supine to Sit;Sitting - Scoot to Edge of Bed Supine to Sit: 1: +2 Total assist Supine to Sit: Patient Percentage: 30% Sitting - Scoot to Edge of Bed: 1: +2 Total assist Sitting - Scoot to Edge of Bed: Patient  Percentage: 0% Sit to Supine: Not Tested (comment) Details for Bed Mobility Assistance: heavy use of bed pad to manage mobility due to pt. weakness Transfers Transfers: Sit to Stand;Stand to Sit Sit to Stand: 1: +2 Total assist Sit to Stand: Patient Percentage: 30% Stand to Sit: 1: +2 Total assist Stand to Sit: Patient Percentage: 30% Details for Transfer Assistance: vc's for technqiue, sequence, safety and to stand as erecty as possible Ambulation/Gait Ambulation/Gait Assistance: 1: +2 Total assist Ambulation/Gait: Patient Percentage: 40% Ambulation Distance (Feet): 2 Feet Assistive device: Rolling walker Ambulation/Gait Assistance Details: Py. needed manual assist for lateral weight shift to unweight and move other LE.  Heavy assist of 2 needed , pt. seemed weaker today Gait Pattern: Step-to pattern;Trunk flexed    Exercises General Exercises - Lower Extremity Ankle Circles/Pumps: AROM;Both;10 reps Quad Sets: AROM;Both;10 reps Short Arc QuadBarbaraann Boys;Right;10 reps Heel Slides: AAROM;Right;10 reps Hip ABduction/ADduction: AAROM;Right;10 reps   PT Diagnosis:    PT Problem List:   PT Treatment Interventions:     PT Goals (current goals can now be found in the care plan section)    Visit Information  Last PT Received On: 08/15/13 Assistance Needed: +2 History of Present Illness: Pt. fell in yard, sustaining R femoral neck fx. and underwent R hip hemiarthroplasty.  He has hx. of  fibromyalgia, CAD,, CHF, a-flutter,, diabetes, CKD,, HIV, anemia, arthritis.  Recent stay at [facility] for rehab due to his fibramyalgia and associated weakness, per pt.    Subjective Data  Subjective: feels weak   Cognition  Cognition Arousal/Alertness: Awake/alert Behavior During Therapy: [facility]  for tasks assessed/performed Overall Cognitive Status: Within Functional Limits for tasks assessed    Balance     End of Session PT - End of Session Equipment Utilized During Treatment: Gait belt Activity  Tolerance: Patient limited by fatigue;Patient limited by pain Patient left: in chair;with call bell/phone within reach Nurse Communication: Mobility status;Precautions;Weight bearing status;Other (comment) (pt. weaker, will need increased assist from nursing)   GP     Ferman Hamming 08/15/2013, 11:46 AM Weldon Picking PT Acute Rehab Services [phone] Beeper [phone]

## 2013-08-15 NOTE — Clinical Social Work Psychosocial (Signed)
Clinical Social Work Department  BRIEF PSYCHOSOCIAL ASSESSMENT  Patient: [name] Proctor  Account Number: [id number]  Admit date: 08/11/13 Clinical Social Worker Anniemae Haberkorn Riley Kill, MSW Date/Time:  Referred by: Physician Date Referred:  Referred for   SNF Placement   Other Referral:  Interview type: Patient  Other interview type: PSYCHOSOCIAL DATA  Living Status: Patient lives with a friend Admitted from facility:  Level of care:  Primary support name: Hunt Oris Primary support relationship to patient: Partner/ friend Degree of support available:  Strong and vested  CURRENT CONCERNS  Current Concerns   Post-Acute Placement   Other Concerns:  SOCIAL WORK ASSESSMENT / PLAN  CSW met with pt re: PT recommendation for SNF.   Pt lives with his friend/partner  CSW explained placement process and answered questions.   Pt reports [facility] as [facility] preference    CSW completed FL2 and initiated SNF search.     Assessment/plan status: Information/Referral to [facility]  Other assessment/ plan:  Information/referral to community resources:  SNF     PATIENT'S/FAMILY'S RESPONSE TO PLAN OF CARE:  Pt  reports he is agreeable to ST SNF in order to increase strength and independence with mobility prior to returning home  Pt verbalized understanding of placement process and appreciation for CSW assist.   Sabino Niemann, MSW [phone]

## 2013-08-15 NOTE — Progress Notes (Signed)
Clinical social worker assisted with patient discharge to skilled nursing facility,[facility] .  CSW addressed all family questions and concerns. CSW copied chart and added all important documents. CSW also set up patient transportation with [employer]. Clinical Social Worker will sign off for now as social work intervention is no longer needed.   Sabino Niemann, MSW,  [phone]

## 2013-08-15 NOTE — Progress Notes (Signed)
ANTICOAGULATION CONSULT NOTE - Follow Up Consult  Pharmacy Consult:  Coumadin  Indication:   Afib + VTE prophylaxis s/p THA  Allergies  Allergen Reactions  . Darvocet [Propoxyphene-Acetaminophen] Nausea And Vomiting  . Seldane [Terfenadine] Nausea And Vomiting  . Sulfonylureas Other (See Comments)    Low grade fever  . Ultracet [Tramadol-Acetaminophen] Nausea And Vomiting    Patient Measurements: Height: 6' 2.02" (188 cm) Weight: 163 lb 5.8 oz (74.1 kg) IBW/kg (Calculated) : 82.24  Vital Signs: Temp: 97.6 F (36.4 C) (08/22 0536) BP: 132/81 mmHg (08/22 0536) Pulse Rate: 65 (08/22 0536)  Labs:  Recent Labs  08/13/13 0535 08/14/13 0500 08/15/13 0530  HGB  --  9.4* 9.1*  HCT  --  29.4* 28.8*  PLT  --  184 190  LABPROT 18.0* 17.4* 19.8*  INR 1.53* 1.47 1.74*  CREATININE 1.89* 1.99* 1.75*    Estimated Creatinine Clearance: 37.6 ml/min (by C-G formula based on Cr of 1.75).  Assessment: 78 YO M continues on Coumadin for history of atrial fibrillation and for VTE prophylaxis s/p THA.  INR sub-therapeutic but trending up.  No bleeding reported.    Noted patient is also on Septra for PCP prophylaxis, which could increase the effect of Coumadin.  It appears that patient was on Septra PTA as well.  Goal of Therapy:  INR 2-3 Monitor platelets by anticoagulation protocol: Yes    Plan:  - Coumadin 5mg  PO x 1 today - Daily PT / INR  [employer], Pharm.D., BCPS Clinical Pharmacist Pager [phone] 08/15/2013 10:25 AM

## 2013-08-15 NOTE — Discharge Summary (Signed)
Physician Discharge Summary  Patient ID: [name] Proctor MRN: [id number] DOB/AGE: 1935/11/21 78 y.o.  Admit date: 08/11/2013 Discharge date: 08/15/2013  Primary Care Physician: [facility]  Discharge Diagnoses:   Right femoral neck fracture status post right hip hemiarthroplasty . Type 2 diabetes mellitus with vascular disease . SSS (sick sinus syndrome) . Chronic kidney disease stage 4 . Anemia . HIV disease . Coronary artery disease . Fibromyalgia  Consults: Orthopedics, Dr. Eulah Pont   Recommendations for Outpatient Follow-up:  1) Weight Bearing: WBAT  Dressings: change to dry dressing PRN  2) can resume lasix 40mg  daily (was on lasix 60mg  BID). Follow-up out-patient with Dr Swaziland    Allergies:   Allergies  Allergen Reactions  . Darvocet [Propoxyphene-Acetaminophen] Nausea And Vomiting  . Seldane [Terfenadine] Nausea And Vomiting  . Sulfonylureas Other (See Comments)    Low grade fever  . Ultracet [Tramadol-Acetaminophen] Nausea And Vomiting     Discharge Medications:   Medication List         abacavir 300 MG tablet  Commonly known as:  ZIAGEN  Take 600 mg by mouth daily.     acetaminophen 325 MG tablet  Commonly known as:  TYLENOL  Take 650 mg by mouth every 8 (eight) hours as needed for pain or fever.     ALCOHOL PREPS EX  Apply topically.     allopurinol 100 MG tablet  Commonly known as:  ZYLOPRIM  Take 400 mg by mouth daily.     ammonium lactate 12 % cream  Commonly known as:  AMLACTIN  Apply 1 Bottle topically 3 (three) times daily as needed for dry skin.     aspirin 81 MG tablet  Take 81 mg by mouth daily.     atorvastatin 10 MG tablet  Commonly known as:  LIPITOR  Take 10 mg by mouth daily.     calcium carbonate 1250 MG tablet  Commonly known as:  OS-CAL - dosed in mg of elemental calcium  Take 2 tablets by mouth daily.     carvedilol 25 MG tablet  Commonly known as:  COREG  Take 25 mg by mouth 2 (two) times daily with a meal.      etravirine 100 MG tablet  Commonly known as:  INTELENCE  Take 200 mg by mouth 2 (two) times daily with a meal.     furosemide 40 MG tablet  Commonly known as:  LASIX  Take 1 tablet (40 mg total) by mouth daily.     hydrALAZINE 25 MG tablet  Commonly known as:  APRESOLINE  Take 25 mg by mouth every 8 (eight) hours.     HYDROcodone-acetaminophen 5-325 MG per tablet  Commonly known as:  NORCO/VICODIN  Take 1-2 tablets by mouth every 6 (six) hours as needed for pain.     insulin NPH 100 UNIT/ML injection  Commonly known as:  HUMULIN N,NOVOLIN N  Inject 12 Units into the skin at bedtime.     insulin regular 100 units/mL injection  Commonly known as:  NOVOLIN R,HUMULIN R  Inject 15 Units into the skin 3 (three) times daily before meals.     lamiVUDine 150 MG tablet  Commonly known as:  EPIVIR  Take 150 mg by mouth daily.     levothyroxine 50 MCG tablet  Commonly known as:  SYNTHROID, LEVOTHROID  Take 50 mcg by mouth daily.     omeprazole 20 MG capsule  Commonly known as:  PRILOSEC  Take 20 mg by mouth 2 (two) times  daily.     predniSONE 10 MG tablet  Commonly known as:  DELTASONE  Take 1 tablet (10 mg total) by mouth daily.     senna-docusate 8.6-50 MG per tablet  Commonly known as:  Senokot-S  Take 1 tablet by mouth 2 (two) times daily as needed for constipation.     sulfamethoxazole-trimethoprim 800-160 MG per tablet  Commonly known as:  BACTRIM DS,SEPTRA DS  Take 1 tablet by mouth every Monday, Wednesday, and Friday.     warfarin 5 MG tablet  Commonly known as:  COUMADIN  Take 5 mg by mouth daily.         Brief H and P: For complete details please refer to admission H and P, but in brief the patient is a 78 year old male with history of CAD status post bypass,(redo bypass in 2010), sick sinus syndrome status post pacemaker placement, atrial flutter status post ablation, iron deficiency anemia, chronic kidney disease, diabetes mellitus type 2, HIV presented  to the ER because of fall. Patient denied having hit his head or loss consciousness. X-rays showed right hip fracture .   Hospital Course:    Acute right femoral neck fracture secondary to mechanical fall. Orthopedics was consulted and patient underwent right hip hemi- arthroplasty. Patient was followed by orthopedics through the hospitalization. Physical therapy evaluation was done and recommended skilled nursing facility for continued rehabilitation. Patient was restarted on Coumadin. He is on Coumadin due to history of atrial flutter.  CAD status post CABG - denies any symptoms. Continue Coreg, ASA   Sick sinus syndrome S/p pacemaker placement, remained stable on telemetry (cardiologist is Dr Thomasene Lot)   Aflutter s/p ablation, on coumadin rate remained well controlled     history of systolic CHF clinically euvolemic. Echo from 2013 with EF of 45%. Creatinine had worsened to 1.99.   -  Lasix was held during the hospitalization however patient can restart Lasix albeit at lower dose 40 mg daily. He should follow up with Dr. Swaziland in 2 weeks.    Diabetes mellitus type 2 - continue NPH insulin at bedtime and with meals. Patient is also on prednisone for fibromyalgia. May need to add correction factor if BS is uncontrolled.  Chronic kidney disease with renal artery stenosis Follow renal function closely especially with Lasix, currently at baseline   Iron def anemia H/h is stable, follow CBC   HIV follows at [place]. continue ART   Hypothyroidism continue synthroid   Day of Discharge BP 132/81  Pulse 65  Temp(Src) 97.6 F (36.4 C) (Oral)  Resp 20  Ht 6' 2.02" (1.88 m)  Wt 74.1 kg (163 lb 5.8 oz)  BMI 20.97 kg/m2  SpO2 98%  Physical Exam: General: Alert and awake oriented x3 not in any acute distress. CVS: S1-S2 clear no murmur rubs or gallops Chest: clear to auscultation bilaterally, no wheezing rales or rhonchi Abdomen: soft nontender, nondistended, normal bowel sounds Extremities:  no cyanosis, clubbing or edema noted bilaterally    The results of significant diagnostics from this hospitalization (including imaging, microbiology, ancillary and laboratory) are listed below for reference.    LAB RESULTS: Basic Metabolic Panel:  Recent Labs Lab 08/14/13 0500 08/15/13 0530  NA 134* 134*  K 4.4 4.1  CL 99 100  CO2 20 23  GLUCOSE 314* 178*  BUN 59* 59*  CREATININE 1.99* 1.75*  CALCIUM 9.3 9.3   CBC:  Recent Labs Lab 08/11/13 1852  08/14/13 0500 08/15/13 0530  WBC 8.6  < > 9.0 9.6  NEUTROABS 6.3  --   --   --   HGB 9.6*  < > 9.4* 9.1*  HCT 29.4*  < > 29.4* 28.8*  MCV 85.5  < > 84.7 85.5  PLT 227  < > 184 190  < > = values in this interval not displayed. Cardiac Enzymes: CBG:  Recent Labs Lab 08/14/13 2251 08/15/13 0716  GLUCAP 234* 178*    Significant Diagnostic Studies:  Dg Chest 1 View  08/11/2013   *RADIOLOGY REPORT*  Clinical Data: Fall, pain over  right  hip  CHEST - 1 VIEW  Comparison: Chest radiograph 07/07/2012  Findings: Left-sided pacemaker overlies enlarged cardiac silhouette.  No effusion, infiltrate, or pneumothorax.  Mild central venous congestion is present.  IMPRESSION: Cardiomegaly mild central venous congestion.   Original Report Authenticated By: Genevive Bi, M.D.   Dg Hip Complete Right  08/11/2013   *RADIOLOGY REPORT*  Clinical Data: Fall, right hip pain  RIGHT HIP - COMPLETE 2+ VIEW  Comparison: None.  Findings: There is an acute fracture of the right femoral neck with varus angulation.  The fractures is either basocervical or sub capital .  There is superior migration of the distal fracture fragment (greater trochanter).  IMPRESSION: Acute right femoral neck fracture.   Original Report Authenticated By: Genevive Bi, M.D.      Disposition and Follow-up: Discharge Orders   Future Orders Complete By Expires   Diet Carb Modified  As directed    Discharge instructions  As directed    Comments:     Weight Bearing:  WBAT Dressings: change to dry dressing PRN   Increase activity slowly  As directed        DISPOSITION: Skilled nursing facility   DIET: carb modified diet   ACTIVITY: As tolerated   TESTS THAT NEED FOLLOW-UP PT/INR   DISCHARGE FOLLOW-UP Follow-up Information   Follow up with Margarita Rana, D, MD. Schedule an appointment as soon as possible for a visit in 2 weeks. (for follow-up)    Specialty:  Orthopedic Surgery   Contact information:   [address] [place] [address] [phone]       Follow up with Peter Swaziland, MD. Schedule an appointment as soon as possible for a visit in 2 weeks. (for follow-up)    Specialty:  Cardiology   Contact information:   [address] [place] [address] [phone]       Time spent on Discharge: 40 mins Signed:   RAI,RIPUDEEP M.D. [facility] 08/15/2013, 11:21 AM Pager: [phone]

## 2013-08-18 ENCOUNTER — Other Ambulatory Visit: Payer: Self-pay

## 2013-08-18 MED ORDER — HYDROCODONE-ACETAMINOPHEN 5-325 MG PO TABS: 1.0000 | ORAL_TABLET | Freq: Four times a day (QID) | ORAL | Status: DC | PRN

## 2013-08-19 ENCOUNTER — Non-Acute Institutional Stay (SKILLED_NURSING_FACILITY): Payer: Medicare Other | Admitting: Nurse Practitioner

## 2013-08-19 DIAGNOSIS — S72001S Fracture of unspecified part of neck of right femur, sequela: Secondary | ICD-10-CM

## 2013-08-19 DIAGNOSIS — N184 Chronic kidney disease, stage 4 (severe): Secondary | ICD-10-CM

## 2013-08-19 DIAGNOSIS — E1159 Type 2 diabetes mellitus with other circulatory complications: Secondary | ICD-10-CM

## 2013-08-19 DIAGNOSIS — I798 Other disorders of arteries, arterioles and capillaries in diseases classified elsewhere: Secondary | ICD-10-CM

## 2013-08-19 DIAGNOSIS — S72009S Fracture of unspecified part of neck of unspecified femur, sequela: Secondary | ICD-10-CM

## 2013-08-19 DIAGNOSIS — B20 Human immunodeficiency virus [HIV] disease: Secondary | ICD-10-CM

## 2013-08-19 DIAGNOSIS — D649 Anemia, unspecified: Secondary | ICD-10-CM

## 2013-08-19 NOTE — Progress Notes (Signed)
Patient ID: [name] Proctor, male   DOB: July 25, 1935, 78 y.o.   MRN: [id number]   PCP: Pcp Not In System  Allergies  Allergen Reactions  . Darvocet [Propoxyphene-Acetaminophen] Nausea And Vomiting  . Seldane [Terfenadine] Nausea And Vomiting  . Sulfonylureas Other (See Comments)    Low grade fever  . Ultracet [Tramadol-Acetaminophen] Nausea And Vomiting    Chief Complaint  Patient presents with  . Hospitalization Follow-up    HPI:  78 year old male with history of CAD status post bypass,(redo bypass in 2010), sick sinus syndrome status post pacemaker placement, atrial flutter status post ablation, iron deficiency anemia, chronic kidney disease, diabetes mellitus type 2, HIV presented to the ER because of fall. Patient denied having hit his head or loss consciousness. X-rays showed right hip fracture and orthopedics was consulted and patient underwent right hip hemi- arthroplasty. Physical therapy evaluation was done and recommended skilled nursing facility for continued rehabilitation.therefor he was transferred to [facility] for STR  Review of Systems:  DATA OBTAINED: from patient, nurse, medical record GENERAL: no fevers, fatigue, appetite changes SKIN: No itching, rash or wounds NOSE: No congestion, drainage or bleeding  MOUTH/THROAT: No mouth or tooth pain, No sore throat, No difficulty chewing or swallowing  RESPIRATORY: No cough, wheezing, SOB CARDIAC: No chest pain, palpitations, lower extremity edema  GI: No abdominal pain, No N/V/D or constipation, No heartburn or reflux  GU: No dysuria, frequency or urgency MUSCULOSKELETAL: No unrelieved bone/joint pain NEUROLOGIC: Awake, alert, appropriate to situation, No change in mental status. Moves all four, no focal deficits PSYCHIATRIC: No overt anxiety or sadness. Sleeps well. No behavior issue.      Past Medical History  Diagnosis Date  . CHF (congestive heart failure)     a. h/o diastolic dysfunction. b. EF 45% by echo 06/2012.   Marland Kitchen History of diastolic dysfunction   . SSS (sick sinus syndrome)   . Coronary artery disease     s/p CABG 2010  . History of atrial flutter     s/p ablation  . Chronic renal insufficiency   . Diabetes mellitus type 2, insulin dependent   . History of GI bleed   . Gastritis   . Esophagitis   . Duodenal ulcer   . IDA (iron deficiency anemia)   . Hypertension   . Hyperlipidemia   . Hypothyroidism   . HIV positive   . RAS (renal artery stenosis)   . pacemaker-MDT     Medtronic EnRhythm generator serial [id number] 2007 Dr. Reyes Ivan. Replaced at [facility] 2012     . Atrial fibrillation     By pacemaker interrogation 06/2012 - device reprogrammed    Past Surgical History  Procedure Laterality Date  . Cardiac catheterization  02/22/2009    EF 40-45%  . Coronary angioplasty  03/06/2006    INTRACORONARY STENTING OF SAPHENOUS VEIN GRAFT TO THE PDA AND INTRACORONARY STENTING OF THE SAPHENOUS VEIN GRAFT TO THE DIAGONAL  . Insert / replace / remove pacemaker      IMPLANT  . Ablation saphenous vein w/ rfa    . Renal artery stent      LEFT  . Coronary artery bypass graft  02/2009    REDO. NEW SAPHENOUS VEIN GRAFT TO THE OBTUSE MARGINAL VESSEL AND A SEQUENTIAL SAPHENOUS VEIN GRAFT TO THE PDA AND POSTERIOR LATERAL BRANCHES OF THE RIGHT CORONARY. THE LIMA GRAFT FROM HIS ORIGINAL SURGERY WAS STILL PATENT.  Marland Kitchen Pacemaker generator change  3/12    done at [facility]  . Total hip  arthroplasty Right 08/13/2013    Procedure: Right Hip Hemiarthroplasty;  Surgeon: Sheral Apley, MD;  Location: [facility] OR;  Service: Orthopedics;  Laterality: Right;   Social History:   reports that he has never smoked. He has never used smokeless tobacco. He reports that he does not drink alcohol or use illicit drugs.  Family History  Problem Relation Age of Onset  . Kidney disease Mother   . Hypertension Father   . Crohn's disease Daughter   . Lupus Sister     Medications: Patient's Medications  New Prescriptions   No  medications on file  Previous Medications   ABACAVIR (ZIAGEN) 300 MG TABLET    Take 600 mg by mouth daily.   ACETAMINOPHEN (TYLENOL) 325 MG TABLET    Take 650 mg by mouth every 8 (eight) hours as needed for pain or fever.   ALLOPURINOL (ZYLOPRIM) 100 MG TABLET    Take 400 mg by mouth daily.   AMMONIUM LACTATE (AMLACTIN) 12 % CREAM    Apply 1 Bottle topically 3 (three) times daily as needed for dry skin.   ASPIRIN 81 MG TABLET    Take 81 mg by mouth daily.   ATORVASTATIN (LIPITOR) 10 MG TABLET    Take 10 mg by mouth daily.    CALCIUM CARBONATE (OS-CAL - DOSED IN MG OF ELEMENTAL CALCIUM) 1250 MG TABLET    Take 2 tablets by mouth daily.   CARVEDILOL (COREG) 25 MG TABLET    Take 25 mg by mouth 2 (two) times daily with a meal.     ETRAVIRINE (INTELENCE) 100 MG TABLET    Take 200 mg by mouth 2 (two) times daily with a meal.   FUROSEMIDE (LASIX) 40 MG TABLET    Take 1 tablet (40 mg total) by mouth daily.   HYDRALAZINE (APRESOLINE) 25 MG TABLET    Take 25 mg by mouth every 8 (eight) hours.   HYDROCODONE-ACETAMINOPHEN (NORCO/VICODIN) 5-325 MG PER TABLET    Take 1-2 tablets by mouth every 6 (six) hours as needed for pain.   INSULIN NPH (HUMULIN N,NOVOLIN N) 100 UNIT/ML INJECTION    Inject 12 Units into the skin at bedtime.   INSULIN REGULAR (NOVOLIN R,HUMULIN R) 100 UNITS/ML INJECTION    Inject 15 Units into the skin 3 (three) times daily before meals.   ISOPROPYL ALCOHOL (ALCOHOL PREPS EX)    Apply topically.     LAMIVUDINE (EPIVIR) 150 MG TABLET    Take 150 mg by mouth daily.   LEVOTHYROXINE (SYNTHROID, LEVOTHROID) 50 MCG TABLET    Take 50 mcg by mouth daily.   OMEPRAZOLE (PRILOSEC) 20 MG CAPSULE    Take 20 mg by mouth 2 (two) times daily.   PREDNISONE (DELTASONE) 10 MG TABLET    Take 1 tablet (10 mg total) by mouth daily.   SENNA-DOCUSATE (SENOKOT-S) 8.6-50 MG PER TABLET    Take 1 tablet by mouth 2 (two) times daily as needed for constipation.   SULFAMETHOXAZOLE-TRIMETHOPRIM (BACTRIM DS,SEPTRA DS)  800-160 MG PER TABLET    Take 1 tablet by mouth every Monday, Wednesday, and Friday.   WARFARIN (COUMADIN) 5 MG TABLET    Take 5 mg by mouth daily.  Modified Medications   No medications on file  Discontinued Medications   No medications on file     Physical Exam:  Filed Vitals:   08/19/13 1806  BP: 130/70  Pulse: 60  Temp: 98.2 F (36.8 C)  Resp: 20    GENERAL APPEARANCE: Alert, conversant. Appropriately  groomed. No acute distress.  SKIN: No diaphoresis rash,wounds-- right hip incision with steri strips  HEAD: Normocephalic, atraumatic  RESPIRATORY: Breathing is even, unlabored. Lung sounds are clear   CARDIOVASCULAR: Heart afib no murmurs, rubs or gallops. No peripheral edema.  ARTERIAL: radial pulse 2+, DP pulse 1+  VENOUS: No varicosities. No venous stasis skin changes  GASTROINTESTINAL: Abdomen is soft, non-tender, not distended w/ normal bowel sounds. No mass, hernias or organomegally GENITOURINARY: Bladder non tender, not distended  MUSCULOSKELETAL: No abnormal joints or musculature NEUROLOGIC: Oriented X3. Cranial nerves 2-12 grossly intact. Moves all extremities no tremor. PSYCHIATRIC: Mood and affect appropriate to situation, no behavioral issues   Labs reviewed: Basic Metabolic Panel:  Recent Labs  16/10/96 0535 08/14/13 0500 08/15/13 0530  NA 138 134* 134*  K 4.3 4.4 4.1  CL 102 99 100  CO2 20 20 23   GLUCOSE 218* 314* 178*  BUN 54* 59* 59*  CREATININE 1.89* 1.99* 1.75*  CALCIUM 9.3 9.3 9.3   Liver Function Tests: No results found for this basename: AST, ALT, ALKPHOS, BILITOT, PROT, ALBUMIN,  in the last 8760 hours No results found for this basename: LIPASE, AMYLASE,  in the last 8760 hours No results found for this basename: AMMONIA,  in the last 8760 hours CBC:  Recent Labs  08/11/13 1852 08/12/13 0430 08/14/13 0500 08/15/13 0530  WBC 8.6 9.3 9.0 9.6  NEUTROABS 6.3  --   --   --   HGB 9.6* 9.9* 9.4* 9.1*  HCT 29.4* 31.6* 29.4* 28.8*  MCV  85.5 86.1 84.7 85.5  PLT 227 206 184 190   Cardiac Enzymes: No results found for this basename: CKTOTAL, CKMB, CKMBINDEX, TROPONINI,  in the last 8760 hours BNP: No components found with this basename: POCBNP,  CBG:  Recent Labs  08/14/13 2251 08/15/13 0716 08/15/13 1138  GLUCAP 234* 178* 227*    Radiological Exams: Dg Chest 1 View  08/11/2013   *RADIOLOGY REPORT*  Clinical Data: Fall, pain over  right  hip  CHEST - 1 VIEW  Comparison: Chest radiograph 07/07/2012  Findings: Left-sided pacemaker overlies enlarged cardiac silhouette.  No effusion, infiltrate, or pneumothorax.  Mild central venous congestion is present.  IMPRESSION: Cardiomegaly mild central venous congestion.   Original Report Authenticated By: Genevive Bi, M.D.   Dg Hip Complete Right  08/11/2013   *RADIOLOGY REPORT*  Clinical Data: Fall, right hip pain  RIGHT HIP - COMPLETE 2+ VIEW  Comparison: None.  Findings: There is an acute fracture of the right femoral neck with varus angulation.  The fractures is either basocervical or sub capital .  There is superior migration of the distal fracture fragment (greater trochanter).  IMPRESSION: Acute right femoral neck fracture.   Original Report Authenticated By: Genevive Bi, M.D.    Assessment/Plan   CAD status post CABG  Stable denies any symptoms. Continue Coreg, ASA   Sick sinus syndrome  Stable S/p pacemaker placement-- conts with cardiology   Aflutter stable on coumadin rate controlled     Systolic CHF Echo from 2013 with EF of 45%. Lasix held during hospitalization now he has resumed this- follow up with cardiology         Diabetes mellitus type 2  with 2 episodes of hypoglycemia- will decrease NPH to 8 units q hs and cont to monitor cbg  Chronic kidney disease with renal artery stenosis  Follow up bmp  Iron def anemia  follow CBC   HIV  continue ART follows at [place].  Hypothyroidism   continue synthroid  5. Femoral neck fracture, right,  sequela Sp right hip hemi- arthroplasty pain well controlled; conts rehab for strength and gait training in hopes to return home

## 2013-08-21 ENCOUNTER — Non-Acute Institutional Stay (SKILLED_NURSING_FACILITY): Payer: Medicare Other | Admitting: Internal Medicine

## 2013-08-21 DIAGNOSIS — E039 Hypothyroidism, unspecified: Secondary | ICD-10-CM

## 2013-08-21 DIAGNOSIS — S72001D Fracture of unspecified part of neck of right femur, subsequent encounter for closed fracture with routine healing: Secondary | ICD-10-CM

## 2013-08-21 DIAGNOSIS — S72009D Fracture of unspecified part of neck of unspecified femur, subsequent encounter for closed fracture with routine healing: Secondary | ICD-10-CM

## 2013-08-21 DIAGNOSIS — I2510 Atherosclerotic heart disease of native coronary artery without angina pectoris: Secondary | ICD-10-CM

## 2013-08-21 DIAGNOSIS — I495 Sick sinus syndrome: Secondary | ICD-10-CM

## 2013-08-21 DIAGNOSIS — I4892 Unspecified atrial flutter: Secondary | ICD-10-CM

## 2013-08-21 DIAGNOSIS — I798 Other disorders of arteries, arterioles and capillaries in diseases classified elsewhere: Secondary | ICD-10-CM

## 2013-08-21 DIAGNOSIS — D649 Anemia, unspecified: Secondary | ICD-10-CM

## 2013-08-21 DIAGNOSIS — E1159 Type 2 diabetes mellitus with other circulatory complications: Secondary | ICD-10-CM

## 2013-08-21 DIAGNOSIS — B20 Human immunodeficiency virus [HIV] disease: Secondary | ICD-10-CM

## 2013-08-21 NOTE — Progress Notes (Signed)
MRN: [id number] Name: [name] Proctor  Sex: male Age: 78 y.o. DOB: 02/05/1935  PSC #: Sonny Dandy Facility/Room: 123 Level Of Care: SNF Provider: Merrilee Seashore D Emergency Contacts: Extended Emergency Contact Information Primary Emergency Contact: Oliver Hum, [place] Home Phone: [phone] Work Phone: [phone] Relation: Friend  Code Status: FULL  Allergies: Darvocet; Seldane; Sulfonylureas; and Ultracet  Chief Complaint  Patient presents with  . nursing home admission    HPI: Patient is 78 y.o. male who fell and broke his R hip. He is here for rehab.   Past Medical History  Diagnosis Date  . CHF (congestive heart failure)     a. h/o diastolic dysfunction. b. EF 45% by echo 06/2012.  Marland Kitchen History of diastolic dysfunction   . SSS (sick sinus syndrome)   . Coronary artery disease     s/p CABG 2010  . History of atrial flutter     s/p ablation  . Chronic renal insufficiency   . Diabetes mellitus type 2, insulin dependent   . History of GI bleed   . Gastritis   . Esophagitis   . Duodenal ulcer   . IDA (iron deficiency anemia)   . Hypertension   . Hyperlipidemia   . Hypothyroidism   . HIV positive   . RAS (renal artery stenosis)   . pacemaker-MDT     Medtronic EnRhythm generator serial [id number] H 2007 Dr. Reyes Ivan. Replaced at [facility] 2012     . Atrial fibrillation     By pacemaker interrogation 06/2012 - device reprogrammed     Past Surgical History  Procedure Laterality Date  . Cardiac catheterization  02/22/2009    EF 40-45%  . Coronary angioplasty  03/06/2006    INTRACORONARY STENTING OF SAPHENOUS VEIN GRAFT TO THE PDA AND INTRACORONARY STENTING OF THE SAPHENOUS VEIN GRAFT TO THE DIAGONAL  . Insert / replace / remove pacemaker      IMPLANT  . Ablation saphenous vein w/ rfa    . Renal artery stent      LEFT  . Coronary artery bypass graft  02/2009    REDO. NEW SAPHENOUS VEIN GRAFT TO THE OBTUSE MARGINAL VESSEL AND A  SEQUENTIAL SAPHENOUS VEIN GRAFT TO THE PDA AND POSTERIOR LATERAL BRANCHES OF THE RIGHT CORONARY. THE LIMA GRAFT FROM HIS ORIGINAL SURGERY WAS STILL PATENT.  Marland Kitchen Pacemaker generator change  3/12    done at [facility]  . Total hip arthroplasty Right 08/13/2013    Procedure: Right Hip Hemiarthroplasty;  Surgeon: Sheral Apley, MD;  Location: [facility] OR;  Service: Orthopedics;  Laterality: Right;      Medication List       This list is accurate as of: 08/21/13 11:59 PM.  Always use your most recent med list.               abacavir 300 MG tablet  Commonly known as:  ZIAGEN  Take 600 mg by mouth daily.     acetaminophen 325 MG tablet  Commonly known as:  TYLENOL  Take 650 mg by mouth every 8 (eight) hours as needed for pain or fever.     allopurinol 100 MG tablet  Commonly known as:  ZYLOPRIM  Take 400 mg by mouth daily.     ammonium lactate 12 % cream  Commonly known as:  AMLACTIN  Apply 1 Bottle topically 3 (three) times daily as needed for dry skin.     aspirin 81 MG tablet  Take 81 mg by mouth daily.     atorvastatin 10 MG tablet  Commonly known as:  LIPITOR  Take 10 mg by mouth daily.     calcium carbonate 1250 MG tablet  Commonly known as:  OS-CAL - dosed in mg of elemental calcium  Take 2 tablets by mouth daily.     carvedilol 25 MG tablet  Commonly known as:  COREG  Take 25 mg by mouth 2 (two) times daily with a meal.     etravirine 100 MG tablet  Commonly known as:  INTELENCE  Take 200 mg by mouth 2 (two) times daily with a meal.     furosemide 40 MG tablet  Commonly known as:  LASIX  Take 1 tablet (40 mg total) by mouth daily.     hydrALAZINE 25 MG tablet  Commonly known as:  APRESOLINE  Take 25 mg by mouth every 8 (eight) hours.     HYDROcodone-acetaminophen 5-325 MG per tablet  Commonly known as:  NORCO/VICODIN  Take 1-2 tablets by mouth every 6 (six) hours as needed for pain.     insulin NPH 100 UNIT/ML injection  Commonly known as:  HUMULIN N,NOVOLIN N   Inject 12 Units into the skin at bedtime.     insulin regular 100 units/mL injection  Commonly known as:  NOVOLIN R,HUMULIN R  Inject 15 Units into the skin 3 (three) times daily before meals.     lamiVUDine 150 MG tablet  Commonly known as:  EPIVIR  Take 150 mg by mouth daily.     levothyroxine 50 MCG tablet  Commonly known as:  SYNTHROID, LEVOTHROID  Take 50 mcg by mouth daily.     omeprazole 20 MG capsule  Commonly known as:  PRILOSEC  Take 20 mg by mouth 2 (two) times daily.     predniSONE 10 MG tablet  Commonly known as:  DELTASONE  Take 1 tablet (10 mg total) by mouth daily.     senna-docusate 8.6-50 MG per tablet  Commonly known as:  Senokot-S  Take 1 tablet by mouth 2 (two) times daily as needed for constipation.     sulfamethoxazole-trimethoprim 800-160 MG per tablet  Commonly known as:  BACTRIM DS,SEPTRA DS  Take 1 tablet by mouth every Monday, Wednesday, and Friday.     warfarin 5 MG tablet  Commonly known as:  COUMADIN  Take 5 mg by mouth daily.        No orders of the defined types were placed in this encounter.     There is no immunization history on file for this patient.  History  Substance Use Topics  . Smoking status: Never Smoker   . Smokeless tobacco: Never Used  . Alcohol Use: No    Family history is noncontributory    Review of Systems Unable to obtain, nurses report no c/o;ST report oriented to self only and knows he is in rehab  There were no vitals filed for this visit.  Physical Exam  GENERAL APPEARANCE: .  SKIN: No diaphoresis rash; dressing over R thigh wound;no redness or heat HEENT-unremarkable RESPIRATORY: Breathing is even, unlabored. Wheezing R lung   CARDIOVASCULAR: Heart RRR no murmurs, rubs or gallops. No peripheral edema.  ARTERIAL: radial pulse 2+, DP pulse 1+  GASTROINTESTINAL: Abdomen is soft, non-tender, not distended w/ normal bowel sounds. No mass, ventral or inguinal hernia. No organomegally GENITOURINARY:  Bladder non tender, not distended  MUSCULOSKELETAL: No abnormal joints or musculature NEUROLOGIC: . Cranial nerves 2-12 grossly intact.  Moves all extremities no tremor. PSYCHIATRIC: Mood and affect appropriate to situation, no behavioral issues  Patient Active Problem List   Diagnosis Date Noted  . Fibromyalgia 08/15/2013  . Femoral neck fracture 08/11/2013  . Anemia 08/11/2013  . History of atrial flutter 07/07/2012  . Pacemaker-mediated tachycardia 07/07/2012  . pacemaker-MDT   . HIV disease   . Hyperlipidemia   . Hx of renal artery stenosis   . Hypothyroidism   . Acute on chronic diastolic congestive heart failure   . SSS (sick sinus syndrome)   . Coronary artery disease   . Chronic kidney disease stage 4   . Type 2 diabetes mellitus with vascular disease   . Hypertensive heart disease without CHF     Functional assessment:   CBC    Component Value Date/Time   WBC 9.6 08/15/2013 0530   RBC 3.37* 08/15/2013 0530   RBC 3.75* 07/10/2009 2215   HGB 9.1* 08/15/2013 0530   HCT 28.8* 08/15/2013 0530   PLT 190 08/15/2013 0530   MCV 85.5 08/15/2013 0530   LYMPHSABS 1.7 08/11/2013 1852   MONOABS 0.6 08/11/2013 1852   EOSABS 0.0 08/11/2013 1852   BASOSABS 0.0 08/11/2013 1852    CMP     Component Value Date/Time   NA 134* 08/15/2013 0530   K 4.1 08/15/2013 0530   CL 100 08/15/2013 0530   CO2 23 08/15/2013 0530   GLUCOSE 178* 08/15/2013 0530   BUN 59* 08/15/2013 0530   CREATININE 1.75* 08/15/2013 0530   CALCIUM 9.3 08/15/2013 0530   PROT 7.1 07/07/2012 1946   ALBUMIN 3.0* 07/07/2012 1946   AST 17 07/07/2012 1946   ALT 16 07/07/2012 1946   ALKPHOS 92 07/07/2012 1946   BILITOT 0.4 07/07/2012 1946   GFRNONAA 36* 08/15/2013 0530   GFRAA 42* 08/15/2013 0530    Assessment and Plan  Femoral neck fracture S/p R hip arthroscopy-was on coumadin for atrial flutter so was restarted on coumadin; getting PT;f/u Dr Margarita Rana, ortho, in 2 weeks  Coronary artery disease Continue coreg and  ASA  SSS (sick sinus syndrome) Has pacemaker;stable on telemetry at hospital  History of atrial flutter stable0- on coumadin for same -pt/inr ordered for 8/29; follow up with Dr [place], cards, 2 weeks from d/c  Type 2 diabetes mellitus with vascular disease On insulin;HbA1con 8/21 was 7.9; last fasting lipid panel 4 years ago-will recheck  Hypothyroidism Continue current meds and order TSH  HIV disease Continue current meds and septra for PCP prophylaxis;pt is [facility] at [facility] for this  Anemia Last hBG 9.4 on 8/21- will recheck    Margit Hanks, MD

## 2013-08-21 NOTE — Assessment & Plan Note (Signed)
Last hBG 9.4 on 8/21- will recheck

## 2013-08-21 NOTE — Assessment & Plan Note (Signed)
S/p R hip arthroscopy-was on coumadin for atrial flutter so was restarted on coumadin; getting PT;f/u Dr Margarita Rana, ortho, in 2 weeks

## 2013-08-21 NOTE — Assessment & Plan Note (Signed)
Has pacemaker;stable on telemetry at hospital

## 2013-08-21 NOTE — Assessment & Plan Note (Signed)
Continue coreg and ASA

## 2013-08-21 NOTE — Assessment & Plan Note (Signed)
On insulin;HbA1con 8/21 was 7.9; last fasting lipid panel 4 years ago-will recheck

## 2013-08-21 NOTE — Assessment & Plan Note (Addendum)
stable0- on coumadin for same -pt/inr ordered for 8/29; follow up with Dr [place], cards, 2 weeks from d/c

## 2013-08-21 NOTE — Assessment & Plan Note (Signed)
Continue current meds and septra for PCP prophylaxis;pt is followed at [facility] for this

## 2013-08-21 NOTE — Assessment & Plan Note (Addendum)
Continue current meds and order TSH

## 2013-08-22 ENCOUNTER — Encounter: Payer: Self-pay | Admitting: Internal Medicine

## 2013-08-22 ENCOUNTER — Non-Acute Institutional Stay (SKILLED_NURSING_FACILITY): Payer: Medicare Other | Admitting: Internal Medicine

## 2013-08-22 DIAGNOSIS — E161 Other hypoglycemia: Secondary | ICD-10-CM

## 2013-08-22 DIAGNOSIS — J9600 Acute respiratory failure, unspecified whether with hypoxia or hypercapnia: Secondary | ICD-10-CM

## 2013-08-22 DIAGNOSIS — J9601 Acute respiratory failure with hypoxia: Secondary | ICD-10-CM

## 2013-08-22 NOTE — Progress Notes (Signed)
MRN: [id number] Name: [name] Proctor  Sex: male Age: 78 y.o. DOB: May 08, 1935  PSC #: Sonny Dandy Facility/Room:  123 Level Of Care: SNF Provider: Merrilee Seashore D Emergency Contacts: Extended Emergency Contact Information Primary Emergency Contact: Oliver Hum, [place] Home Phone: [phone] Work Phone: [phone] Relation: Friend  Code Status: FULL  Allergies: Darvocet; Seldane; Sulfonylureas; and Ultracet  Chief Complaint  Patient presents with  . Acute Visit    HPI: Patient is 78 y.o. male who was found by me this am unconscious with slow and erratic respirations.  Past Medical History  Diagnosis Date  . CHF (congestive heart failure)     a. h/o diastolic dysfunction. b. EF 45% by echo 06/2012.  Marland Kitchen History of diastolic dysfunction   . SSS (sick sinus syndrome)   . Coronary artery disease     s/p CABG 2010  . History of atrial flutter     s/p ablation  . Chronic renal insufficiency   . Diabetes mellitus type 2, insulin dependent   . History of GI bleed   . Gastritis   . Esophagitis   . Duodenal ulcer   . IDA (iron deficiency anemia)   . Hypertension   . Hyperlipidemia   . Hypothyroidism   . HIV positive   . RAS (renal artery stenosis)   . pacemaker-MDT     Medtronic EnRhythm generator serial [id number] H 2007 Dr. Reyes Ivan. Replaced at [facility] 2012     . Atrial fibrillation     By pacemaker interrogation 06/2012 - device reprogrammed     Past Surgical History  Procedure Laterality Date  . Cardiac catheterization  02/22/2009    EF 40-45%  . Coronary angioplasty  03/06/2006    INTRACORONARY STENTING OF SAPHENOUS VEIN GRAFT TO THE PDA AND INTRACORONARY STENTING OF THE SAPHENOUS VEIN GRAFT TO THE DIAGONAL  . Insert / replace / remove pacemaker      IMPLANT  . Ablation saphenous vein w/ rfa    . Renal artery stent      LEFT  . Coronary artery bypass graft  02/2009    REDO. NEW SAPHENOUS VEIN GRAFT TO THE OBTUSE MARGINAL  VESSEL AND A SEQUENTIAL SAPHENOUS VEIN GRAFT TO THE PDA AND POSTERIOR LATERAL BRANCHES OF THE RIGHT CORONARY. THE LIMA GRAFT FROM HIS ORIGINAL SURGERY WAS STILL PATENT.  Marland Kitchen Pacemaker generator change  3/12    done at [facility]  . Total hip arthroplasty Right 08/13/2013    Procedure: Right Hip Hemiarthroplasty;  Surgeon: Sheral Apley, MD;  Location: [facility] OR;  Service: Orthopedics;  Laterality: Right;      Medication List       This list is accurate as of: 08/22/13  9:59 AM.  Always use your most recent med list.               abacavir 300 MG tablet  Commonly known as:  ZIAGEN  Take 600 mg by mouth daily.     acetaminophen 325 MG tablet  Commonly known as:  TYLENOL  Take 650 mg by mouth every 8 (eight) hours as needed for pain or fever.     allopurinol 100 MG tablet  Commonly known as:  ZYLOPRIM  Take 400 mg by mouth daily.     ammonium lactate 12 % cream  Commonly known as:  AMLACTIN  Apply 1 Bottle topically 3 (three) times daily as needed for dry skin.     aspirin 81 MG  tablet  Take 81 mg by mouth daily.     atorvastatin 10 MG tablet  Commonly known as:  LIPITOR  Take 10 mg by mouth daily.     calcium carbonate 1250 MG tablet  Commonly known as:  OS-CAL - dosed in mg of elemental calcium  Take 2 tablets by mouth daily.     carvedilol 25 MG tablet  Commonly known as:  COREG  Take 25 mg by mouth 2 (two) times daily with a meal.     etravirine 100 MG tablet  Commonly known as:  INTELENCE  Take 200 mg by mouth 2 (two) times daily with a meal.     furosemide 40 MG tablet  Commonly known as:  LASIX  Take 1 tablet (40 mg total) by mouth daily.     hydrALAZINE 25 MG tablet  Commonly known as:  APRESOLINE  Take 25 mg by mouth every 8 (eight) hours.     HYDROcodone-acetaminophen 5-325 MG per tablet  Commonly known as:  NORCO/VICODIN  Take 1-2 tablets by mouth every 6 (six) hours as needed for pain.     lamiVUDine 150 MG tablet  Commonly known as:  EPIVIR  Take 150  mg by mouth daily.     levothyroxine 50 MCG tablet  Commonly known as:  SYNTHROID, LEVOTHROID  Take 50 mcg by mouth daily.     omeprazole 20 MG capsule  Commonly known as:  PRILOSEC  Take 20 mg by mouth 2 (two) times daily.     predniSONE 10 MG tablet  Commonly known as:  DELTASONE  Take 1 tablet (10 mg total) by mouth daily.     senna-docusate 8.6-50 MG per tablet  Commonly known as:  Senokot-S  Take 1 tablet by mouth 2 (two) times daily as needed for constipation.     sulfamethoxazole-trimethoprim 800-160 MG per tablet  Commonly known as:  BACTRIM DS,SEPTRA DS  Take 1 tablet by mouth every Monday, Wednesday, and Friday.     warfarin 5 MG tablet  Commonly known as:  COUMADIN  Take 5 mg by mouth daily.        No orders of the defined types were placed in this encounter.     There is no immunization history on file for this patient.  History  Substance Use Topics  . Smoking status: Never Smoker   . Smokeless tobacco: Never Used  . Alcohol Use: No    Family history is noncontributory    Review of Systems Unable to obtain and not pertinent anyway with [pt's condition  Filed Vitals:   08/22/13 0956  BP: 108/59  Pulse: 65  Temp: 97 F (36.1 C)  Resp: 24    Physical Exam  GENERAL APPEARANCE: unconscious SKIN: + diaphoresis and pallor HEENT- unremarkable RESPIRATORY: coarse BS, hypoventilation  ;AFTER RESUSITATION -wheezing in R lung CARDIOVASCULAR: Heart RRR no murmurs, rubs or gallops. No peripheral edema.  ARTERIAL: radial pulse 2+, DP pulse 1+  GASTROINTESTINAL: Abdomen is soft, non-tender, not distended w/ normal bowel sounds MUSCULOSKELETAL: No abnormal joints or musculature NEUROLOGIC: unobtaniable PSYCHIATRIC:unobtainable  Patient Active Problem List   Diagnosis Date Noted  . Fibromyalgia 08/15/2013  . Femoral neck fracture 08/11/2013  . Anemia 08/11/2013  . History of atrial flutter 07/07/2012  . Pacemaker-mediated tachycardia 07/07/2012   . pacemaker-MDT   . HIV disease   . Hyperlipidemia   . Hx of renal artery stenosis   . Hypothyroidism   . Acute on chronic diastolic congestive heart failure   .  SSS (sick sinus syndrome)   . Coronary artery disease   . Chronic kidney disease stage 4   . Type 2 diabetes mellitus with vascular disease   . Hypertensive heart disease without CHF      CBC    Component Value Date/Time   WBC 9.6 08/15/2013 0530   RBC 3.37* 08/15/2013 0530   RBC 3.75* 07/10/2009 2215   HGB 9.1* 08/15/2013 0530   HCT 28.8* 08/15/2013 0530   PLT 190 08/15/2013 0530   MCV 85.5 08/15/2013 0530   LYMPHSABS 1.7 08/11/2013 1852   MONOABS 0.6 08/11/2013 1852   EOSABS 0.0 08/11/2013 1852   BASOSABS 0.0 08/11/2013 1852    CMP     Component Value Date/Time   NA 134* 08/15/2013 0530   K 4.1 08/15/2013 0530   CL 100 08/15/2013 0530   CO2 23 08/15/2013 0530   GLUCOSE 178* 08/15/2013 0530   BUN 59* 08/15/2013 0530   CREATININE 1.75* 08/15/2013 0530   CALCIUM 9.3 08/15/2013 0530   PROT 7.1 07/07/2012 1946   ALBUMIN 3.0* 07/07/2012 1946   AST 17 07/07/2012 1946   ALT 16 07/07/2012 1946   ALKPHOS 92 07/07/2012 1946   BILITOT 0.4 07/07/2012 1946   GFRNONAA 36* 08/15/2013 0530   GFRAA 42* 08/15/2013 0530   I immediately sought a nurse for help. She is the nurse who give the insulin and pt had NOT had his am insulin yet; Pulse ox in the 80's; O2 was started immediately and sats improved to 96%; CBG was 25; pt could be aroused with voice and touch, then drifted back out.sugar was place under tongue while gel glucose was coming.  We had some success with these interventions and pt was able to swallow some medpass; pt was getting more responsive very slowly while an IV was being obtained;by the time it was in his resposiveness was better to the point that he could drink from a straw; D5 was hung and pt is getting a 200 cc bolus while we begin to get breakfast in him. He says he is hungry!   Assessment and Plan #1 Acute hypoglycemic  episode-  Pt gets 15 units NPH at night which was what kicked in this am;this is D/Ced as is his regular insulin in the am. Pt is reported as eating very little so will check CBG'd 4 times a day for several days and start 5 units regular insulin for BS>200 at meals only. #2 Hypoxia and wheezing R lung- its too early for PNA from our ministrations this am but pt may have aspirated or just developed PNA and that is why he got hypoglycemic to his usual dose insulin; stat CXR and continuous O2 at 3 L  Merrilee Seashore D, MD  Total time with pt> 60 minutes

## 2013-09-04 ENCOUNTER — Non-Acute Institutional Stay (SKILLED_NURSING_FACILITY): Payer: Medicare Other | Admitting: Nurse Practitioner

## 2013-09-04 DIAGNOSIS — I4892 Unspecified atrial flutter: Secondary | ICD-10-CM

## 2013-09-04 DIAGNOSIS — Z950 Presence of cardiac pacemaker: Secondary | ICD-10-CM

## 2013-09-04 DIAGNOSIS — I499 Cardiac arrhythmia, unspecified: Secondary | ICD-10-CM

## 2013-09-04 DIAGNOSIS — Z7901 Long term (current) use of anticoagulants: Secondary | ICD-10-CM

## 2013-09-04 DIAGNOSIS — I498 Other specified cardiac arrhythmias: Secondary | ICD-10-CM

## 2013-09-04 NOTE — Progress Notes (Signed)
Patient ID: [name] Proctor, male   DOB: 1935-12-07, 78 y.o.   MRN: [id number]    Nursing Home Location:  [facility]   Place of Service: SNF (31)  Chief Complaint  Patient presents with  . coumadin management    HPI:  78 year old male with history of CAD status post bypass,(redo bypass in 2010), sick sinus syndrome status post pacemaker placement, atrial flutter status post ablation, iron deficiency anemia, chronic kidney disease, diabetes mellitus type 2, HIV who is on long term coumadin and is being seen today  for coumadin management.  Denies any signs of bleeding or unusual bruising Was discharged from the hospital on 08/15/13 after right femoral neck fracture s/p right hip hemiarthroplasty INR/ COUMADIN review  08/18/13 INR was 2.5 on 5 mg pt on bactrim- pt cont'd on 5 mg 08/21/13 INR 3.2 on 5 mg pt on bactrim- pt decreased to 4 mg 08/28/13 INR 5.6 pt on bactrim and Levaquin pt given Vit K 2.5 and coumadin on hold 08/30/13 INR 3.1 pt restarted on 2 mg  08/31/13 INR 2.7-2 mg continued   09/04/13 INR 1.37 on 2 mg coumadin  Review of Systems:    DATA OBTAINED: from patient GENERAL: Feels well no fevers, fatigue, appetite changes NOSE: No  bleeding  MOUTH/THROAT: No mouth or tooth pain, no bleeding from gums RESPIRATORY: No cough, wheezing, SOB CARDIAC: No chest pain, palpitations GI: No abdominal pain, No changes of bowel pattern, color or consistency   GU: No dysuria, frequency or urgency NEUROLOGIC: Awake, alert, appropriate to situation   Medications: Patient's Medications  New Prescriptions   No medications on file  Previous Medications   ABACAVIR (ZIAGEN) 300 MG TABLET    Take 600 mg by mouth daily.   ACETAMINOPHEN (TYLENOL) 325 MG TABLET    Take 650 mg by mouth every 8 (eight) hours as needed for pain or fever.   ALLOPURINOL (ZYLOPRIM) 100 MG TABLET    Take 400 mg by mouth daily.   AMMONIUM LACTATE (AMLACTIN) 12 % CREAM    Apply 1 Bottle topically 3 (three)  times daily as needed for dry skin.   ASPIRIN 81 MG TABLET    Take 81 mg by mouth daily.   ATORVASTATIN (LIPITOR) 10 MG TABLET    Take 10 mg by mouth daily.    CALCIUM CARBONATE (OS-CAL - DOSED IN MG OF ELEMENTAL CALCIUM) 1250 MG TABLET    Take 2 tablets by mouth daily.   CARVEDILOL (COREG) 25 MG TABLET    Take 25 mg by mouth 2 (two) times daily with a meal.     ETRAVIRINE (INTELENCE) 100 MG TABLET    Take 200 mg by mouth 2 (two) times daily with a meal.   FUROSEMIDE (LASIX) 40 MG TABLET    Take 1 tablet (40 mg total) by mouth daily.   HYDRALAZINE (APRESOLINE) 25 MG TABLET    Take 25 mg by mouth every 8 (eight) hours.   HYDROCODONE-ACETAMINOPHEN (NORCO/VICODIN) 5-325 MG PER TABLET    Take 1-2 tablets by mouth every 6 (six) hours as needed for pain.   LAMIVUDINE (EPIVIR) 150 MG TABLET    Take 150 mg by mouth daily.   LEVOTHYROXINE (SYNTHROID, LEVOTHROID) 50 MCG TABLET    Take 50 mcg by mouth daily.   OMEPRAZOLE (PRILOSEC) 20 MG CAPSULE    Take 20 mg by mouth 2 (two) times daily.   PREDNISONE (DELTASONE) 10 MG TABLET    Take 1 tablet (10 mg total) by  mouth daily.   SENNA-DOCUSATE (SENOKOT-S) 8.6-50 MG PER TABLET    Take 1 tablet by mouth 2 (two) times daily as needed for constipation.   SULFAMETHOXAZOLE-TRIMETHOPRIM (BACTRIM DS,SEPTRA DS) 800-160 MG PER TABLET    Take 1 tablet by mouth every Monday, Wednesday, and Friday.   WARFARIN (COUMADIN) 2 MG TABLET    Take 2 mg by mouth daily.  Modified Medications   No medications on file  Discontinued Medications   WARFARIN (COUMADIN) 5 MG TABLET    Take 5 mg by mouth daily.     Physical Exam:  ENERAL APPEARANCE: Alert, conversant. Appropriately groomed. No acute distress.  NOSE: No deformity or discharge.  MOUTH/THROAT: Lips w/o lesions. Mouth and throat normal. Tongue moist, w/o lesion.  NECK: No thyroid tenderness, enlargement or nodule  RESPIRATORY: Breathing is even, unlabored. Lung sounds are clear   CARDIOVASCULAR: Heart RRR no murmurs,  rubs or gallops.  ARTERIAL: radial pulse 2+ GASTROINTESTINAL: Abdomen is soft, non-tender, not distended w/ normal bowel sounds. GENITOURINARY: Bladder non tender, not distended  NEUROLOGIC: Orinted X3. Cranial nerves 2-12 grossly intact. Moves all extremities no tremor.      Assessment/Plan  Aflutter s/p ablation, on coumadin rate remained well controlled     Sick sinus syndrome S/p pacemaker placement, remained stable on telemetry (cardiologist is Dr Thomasene Lot)   Long term (current) use of anticoagulants Will increase coumadin to 3 mg and follow up INR tomorrow Will have staff take INR M,W, F while on antibiotic or until INR is stable

## 2013-09-08 ENCOUNTER — Non-Acute Institutional Stay (SKILLED_NURSING_FACILITY): Payer: Medicare Other | Admitting: Nurse Practitioner

## 2013-09-08 ENCOUNTER — Encounter: Payer: Self-pay | Admitting: Nurse Practitioner

## 2013-09-08 DIAGNOSIS — Z7901 Long term (current) use of anticoagulants: Secondary | ICD-10-CM

## 2013-09-08 DIAGNOSIS — D649 Anemia, unspecified: Secondary | ICD-10-CM

## 2013-09-08 DIAGNOSIS — I4892 Unspecified atrial flutter: Secondary | ICD-10-CM

## 2013-09-08 NOTE — Progress Notes (Signed)
Patient ID: BRITTIAN RENALDO, male   DOB: 26-Nov-1935, 78 y.o.   MRN: [id number]  Nursing Home Location:  [facility]   Place of Service: SNF (31)  Chief Complaint  Patient presents with  . Acute Visit    coumadin and lab review     HPI:  78 year old male with history of CAD status post bypass,(redo bypass in 2010), sick sinus syndrome status post pacemaker placement, atrial flutter status post ablation, iron deficiency anemia, chronic kidney disease, diabetes mellitus type 2, HIV who is on long term coumadin and is being seen today for coumadin management and  lab review  Denies any signs of bleeding or unusual bruising   INR/ COUMADIN review  08/18/13 INR was 2.5 on 5 mg pt on bactrim- pt cont'd on 5 mg  08/21/13 INR 3.2 on 5 mg pt on bactrim- pt decreased to 4 mg  08/28/13 INR 5.6 pt on bactrim and Levaquin pt given Vit K 2.5 and coumadin on hold  08/30/13 INR 3.1 pt restarted on 2 mg  08/31/13 INR 2.7-2 mg continued  09/04/13 INR 1.37 on 2 mg coumadin and was increased to 3mg   09/08/13 INR 1.2   Review of Systems:   DATA OBTAINED: from patient  GENERAL: Feels well no fevers, fatigue, appetite changes  NOSE: No bleeding  MOUTH/THROAT: No mouth or tooth pain, no bleeding from gums  RESPIRATORY: No cough, wheezing, SOB  CARDIAC: No chest pain, palpitations  GI: No abdominal pain, No changes of bowel pattern, color or consistency  GU: No dysuria, frequency or urgency  NEUROLOGIC: Awake, alert, appropriate to situation    Medications: Patient's Medications  New Prescriptions   No medications on file  Previous Medications   ABACAVIR (ZIAGEN) 300 MG TABLET    Take 600 mg by mouth daily.   ACETAMINOPHEN (TYLENOL) 325 MG TABLET    Take 650 mg by mouth every 8 (eight) hours as needed for pain or fever.   ALLOPURINOL (ZYLOPRIM) 100 MG TABLET    Take 400 mg by mouth daily.   AMMONIUM LACTATE (AMLACTIN) 12 % CREAM    Apply 1 Bottle topically 3 (three) times daily as needed for  dry skin.   ASPIRIN 81 MG TABLET    Take 81 mg by mouth daily.   ATORVASTATIN (LIPITOR) 10 MG TABLET    Take 10 mg by mouth daily.    CALCIUM CARBONATE (OS-CAL - DOSED IN MG OF ELEMENTAL CALCIUM) 1250 MG TABLET    Take 2 tablets by mouth daily.   CARVEDILOL (COREG) 25 MG TABLET    Take 25 mg by mouth 2 (two) times daily with a meal.     ETRAVIRINE (INTELENCE) 100 MG TABLET    Take 200 mg by mouth 2 (two) times daily with a meal.   FUROSEMIDE (LASIX) 40 MG TABLET    Take 1 tablet (40 mg total) by mouth daily.   HYDRALAZINE (APRESOLINE) 25 MG TABLET    Take 25 mg by mouth every 8 (eight) hours.   HYDROCODONE-ACETAMINOPHEN (NORCO/VICODIN) 5-325 MG PER TABLET    Take 1-2 tablets by mouth every 6 (six) hours as needed for pain.   LAMIVUDINE (EPIVIR) 150 MG TABLET    Take 150 mg by mouth daily.   LEVOTHYROXINE (SYNTHROID, LEVOTHROID) 50 MCG TABLET    Take 50 mcg by mouth daily.   OMEPRAZOLE (PRILOSEC) 20 MG CAPSULE    Take 20 mg by mouth 2 (two) times daily.   PREDNISONE (DELTASONE) 10 MG  TABLET    Take 1 tablet (10 mg total) by mouth daily.   SENNA-DOCUSATE (SENOKOT-S) 8.6-50 MG PER TABLET    Take 1 tablet by mouth 2 (two) times daily as needed for constipation.   SULFAMETHOXAZOLE-TRIMETHOPRIM (BACTRIM DS,SEPTRA DS) 800-160 MG PER TABLET    Take 1 tablet by mouth every Monday, Wednesday, and Friday.   WARFARIN (COUMADIN) 3 MG TABLET    Take 3 mg by mouth daily.  Modified Medications   No medications on file  Discontinued Medications   WARFARIN (COUMADIN) 2 MG TABLET    Take 2 mg by mouth daily.     Physical Exam:  Filed Vitals:   09/08/13 2156  BP: 129/78  Pulse: 69  Temp: 98.6 F (37 C)  Resp: 20    GENERAL APPEARANCE: Alert, conversant. Appropriately groomed. No acute distress.  NOSE: No deformity or discharge.  MOUTH/THROAT: Lips w/o lesions. Mouth and throat normal. Tongue moist, w/o lesion.  NECK: No thyroid tenderness, enlargement or nodule  RESPIRATORY: Breathing is even,  unlabored. Lung sounds are clear  CARDIOVASCULAR: Heart RRR no murmurs, rubs or gallops.  ARTERIAL: radial pulse 2+  GASTROINTESTINAL: Abdomen is soft, non-tender, not distended w/ normal bowel sounds. GENITOURINARY: Bladder non tender, not distended  NEUROLOGIC: Orinted X3. Cranial nerves 2-12 grossly intact. Moves all extremities no tremor.   Labs reviewed/Significant Diagnostic Results: Iron and IBC    Result: 09/03/2013 1:30 PM   ( Status: F )       Iron 29   L 42-165 ug/dL SLN   UIBC 161     096-045 ug/dL SLN   TIBC 409     811-914 ug/dL SLN   %SAT 10   L 78-29 % SLN   CBC with Diff    Result: 09/03/2013 2:40 PM   ( Status: F )       WBC 6.4     4.0-10.5 K/uL SLN   RBC 3.69   L 4.22-5.81 MIL/uL SLN   Hemoglobin 9.2   L 13.0-17.0 g/dL SLN   Hematocrit 56.2   L 39.0-52.0 % SLN   MCV 81.6     78.0-100.0 fL SLN   MCH 24.9   L 26.0-34.0 pg SLN   MCHC 30.6     30.0-36.0 g/dL SLN   RDW 13.0   H 86.5-78.4 % SLN   Platelet Count 202     150-400 K/uL SLN   Granulocyte % 44     43-77 % SLN   Absolute Gran 2.8     1.7-7.7 K/uL SLN   Lymph % 46     12-46 % SLN   Absolute Lymph 3.0     0.7-4.0 K/uL SLN   Mono % 8     3-12 % SLN   Absolute Mono 0.5     0.1-1.0 K/uL SLN   Eos % 2     0-5 % SLN   Absolute Eos 0.1     0.0-0.7 K/uL SLN   Baso % 0     0-1 % SLN   Absolute Baso 0.0     0.0-0.1 K/uL SLN   Smear Review Criteria for review not met  SLN   Ferritin    Result: 09/03/2013 2:56 PM   ( Status: F )       Ferritin 102    Assessment/Plan 1. Anemia Will start ferrous sulfate 325 mg BID with meals and follow up CBC  2. Long term (current) use of anticoagulants Pt off Levaquin; INR today  was 1.2; will increase coumadin to 4 mg daily and recheck in 3 days  3. History of atrial flutter Stable; Rate controlled on current medication will cont to monitor

## 2013-09-11 ENCOUNTER — Non-Acute Institutional Stay (SKILLED_NURSING_FACILITY): Payer: Medicare Other | Admitting: Nurse Practitioner

## 2013-09-11 DIAGNOSIS — S72009S Fracture of unspecified part of neck of unspecified femur, sequela: Secondary | ICD-10-CM

## 2013-09-11 DIAGNOSIS — D649 Anemia, unspecified: Secondary | ICD-10-CM

## 2013-09-11 DIAGNOSIS — I798 Other disorders of arteries, arterioles and capillaries in diseases classified elsewhere: Secondary | ICD-10-CM

## 2013-09-11 DIAGNOSIS — Z7901 Long term (current) use of anticoagulants: Secondary | ICD-10-CM

## 2013-09-11 DIAGNOSIS — I119 Hypertensive heart disease without heart failure: Secondary | ICD-10-CM

## 2013-09-11 DIAGNOSIS — E1159 Type 2 diabetes mellitus with other circulatory complications: Secondary | ICD-10-CM

## 2013-09-11 DIAGNOSIS — I4892 Unspecified atrial flutter: Secondary | ICD-10-CM

## 2013-09-12 ENCOUNTER — Encounter: Payer: Self-pay | Admitting: Nurse Practitioner

## 2013-09-12 NOTE — Progress Notes (Signed)
Patient ID: [name] Proctor, male   DOB: 1935-10-15, 78 y.o.   MRN: [id number]    Allergies  Allergen Reactions  . Darvocet [Propoxyphene-Acetaminophen] Nausea And Vomiting  . Seldane [Terfenadine] Nausea And Vomiting  . Sulfonylureas Other (See Comments)    Low grade fever  . Ultracet [Tramadol-Acetaminophen] Nausea And Vomiting    Chief Complaint  Patient presents with  . Discharge Note    HPI:  78 year old male with history of CAD status post bypass,(redo bypass in 2010), sick sinus syndrome status post pacemaker placement, atrial flutter status post ablation, iron deficiency anemia, chronic kidney disease, diabetes mellitus type 2, HIV was admitted to the hospital and patient underwent right hip hemi- arthroplasty. Physical therapy evaluation was done and recommended skilled nursing facility for continued rehabilitation therefore he was transferred to [facility] for STR and now he is ready for be discharged home with home health.  Since he has been at [facility] his INR has been adjusted and he is currently on 4mg  daily Pt was also having low blood sugars and insulin was completely dc'd and has been added back since; pt seems unclear on insulin he was taking at home.    Review of Systems:  Review of Systems  Constitutional: Negative for fever, chills and malaise/fatigue.  HENT: Negative for nosebleeds.   Respiratory: Negative for shortness of breath.   Cardiovascular: Negative for chest pain and palpitations.  Gastrointestinal: Negative for heartburn, abdominal pain, diarrhea and constipation.  Genitourinary: Negative for dysuria and frequency.  Musculoskeletal: Negative for myalgias and joint pain.  Skin:       Well healed incision  Neurological: Positive for weakness (still with generalized weakness). Negative for dizziness and headaches.  Endo/Heme/Allergies: Does not bruise/bleed easily.     Past Medical History  Diagnosis Date  . CHF (congestive heart failure)     a.  h/o diastolic dysfunction. b. EF 45% by echo 06/2012.  Marland Kitchen History of diastolic dysfunction   . SSS (sick sinus syndrome)   . Coronary artery disease     s/p CABG 2010  . History of atrial flutter     s/p ablation  . Chronic renal insufficiency   . Diabetes mellitus type 2, insulin dependent   . History of GI bleed   . Gastritis   . Esophagitis   . Duodenal ulcer   . IDA (iron deficiency anemia)   . Hypertension   . Hyperlipidemia   . Hypothyroidism   . HIV positive   . RAS (renal artery stenosis)   . pacemaker-MDT     Medtronic EnRhythm generator serial [id number] H 2007 Dr. Reyes Ivan. Replaced at [facility] 2012     . Atrial fibrillation     By pacemaker interrogation 06/2012 - device reprogrammed    Past Surgical History  Procedure Laterality Date  . Cardiac catheterization  02/22/2009    EF 40-45%  . Coronary angioplasty  03/06/2006    INTRACORONARY STENTING OF SAPHENOUS VEIN GRAFT TO THE PDA AND INTRACORONARY STENTING OF THE SAPHENOUS VEIN GRAFT TO THE DIAGONAL  . Insert / replace / remove pacemaker      IMPLANT  . Ablation saphenous vein w/ rfa    . Renal artery stent      LEFT  . Coronary artery bypass graft  02/2009    REDO. NEW SAPHENOUS VEIN GRAFT TO THE OBTUSE MARGINAL VESSEL AND A SEQUENTIAL SAPHENOUS VEIN GRAFT TO THE PDA AND POSTERIOR LATERAL BRANCHES OF THE RIGHT CORONARY. THE LIMA GRAFT FROM HIS ORIGINAL SURGERY WAS STILL  PATENT.  Marland Kitchen Pacemaker generator change  3/12    done at [facility]  . Total hip arthroplasty Right 08/13/2013    Procedure: Right Hip Hemiarthroplasty;  Surgeon: Sheral Apley, MD;  Location: [facility] OR;  Service: Orthopedics;  Laterality: Right;   Social History:   reports that he has never smoked. He has never used smokeless tobacco. He reports that he does not drink alcohol or use illicit drugs.  Family History  Problem Relation Age of Onset  . Kidney disease Mother   . Hypertension Father   . Crohn's disease Daughter   . Lupus Sister      Medications: Patient's Medications  New Prescriptions   No medications on file  Previous Medications   ABACAVIR (ZIAGEN) 300 MG TABLET    Take 600 mg by mouth daily.   ACETAMINOPHEN (TYLENOL) 325 MG TABLET    Take 650 mg by mouth every 8 (eight) hours as needed for pain or fever.   ALLOPURINOL (ZYLOPRIM) 100 MG TABLET    Take 400 mg by mouth daily.   AMMONIUM LACTATE (AMLACTIN) 12 % CREAM    Apply 1 Bottle topically 3 (three) times daily as needed for dry skin.   ASPIRIN 81 MG TABLET    Take 81 mg by mouth daily.   ATORVASTATIN (LIPITOR) 10 MG TABLET    Take 10 mg by mouth daily.    CALCIUM CARBONATE (OS-CAL - DOSED IN MG OF ELEMENTAL CALCIUM) 1250 MG TABLET    Take 2 tablets by mouth daily.   CARVEDILOL (COREG) 25 MG TABLET    Take 25 mg by mouth 2 (two) times daily with a meal.     ETRAVIRINE (INTELENCE) 100 MG TABLET    Take 200 mg by mouth 2 (two) times daily with a meal.   FERROUS SULFATE 325 (65 FE) MG TABLET    Take 325 mg by mouth 2 (two) times daily with a meal.   FUROSEMIDE (LASIX) 40 MG TABLET    Take 1 tablet (40 mg total) by mouth daily.   HYDRALAZINE (APRESOLINE) 25 MG TABLET    Take 25 mg by mouth every 8 (eight) hours.   HYDROCODONE-ACETAMINOPHEN (NORCO/VICODIN) 5-325 MG PER TABLET    Take 1-2 tablets by mouth every 6 (six) hours as needed for pain.   LAMIVUDINE (EPIVIR) 150 MG TABLET    Take 150 mg by mouth daily.   LEVOTHYROXINE (SYNTHROID, LEVOTHROID) 50 MCG TABLET    Take 50 mcg by mouth daily.   OMEPRAZOLE (PRILOSEC) 20 MG CAPSULE    Take 20 mg by mouth 2 (two) times daily.   PREDNISONE (DELTASONE) 10 MG TABLET    Take 1 tablet (10 mg total) by mouth daily.   SENNA-DOCUSATE (SENOKOT-S) 8.6-50 MG PER TABLET    Take 1 tablet by mouth 2 (two) times daily as needed for constipation.   SULFAMETHOXAZOLE-TRIMETHOPRIM (BACTRIM DS,SEPTRA DS) 800-160 MG PER TABLET    Take 1 tablet by mouth every Monday, Wednesday, and Friday.   WARFARIN (COUMADIN) 3 MG TABLET    Take 4 mg by  mouth daily.   Modified Medications   No medications on file  Discontinued Medications   No medications on file     Physical Exam:  Filed Vitals:   09/11/13 1614  BP: 120/64  Pulse: 60  Temp: 98.3 F (36.8 C)  Resp: 20   Physical Exam  Constitutional: He is oriented to person, place, and time and well-developed, well-nourished, and in no distress. No distress.  HENT:  Head: Normocephalic and atraumatic.  Nose: Nose normal.  Mouth/Throat: Oropharynx is clear and moist. No oropharyngeal exudate.  Eyes: Conjunctivae and EOM are normal. Pupils are equal, round, and reactive to light.  Neck: Normal range of motion. Neck supple.  Cardiovascular: Normal rate and normal heart sounds.   Pulmonary/Chest: Effort normal and breath sounds normal. No respiratory distress.  Abdominal: Soft. Bowel sounds are normal. He exhibits no distension.  Musculoskeletal: He exhibits no edema and no tenderness.  Generalized weakness, uses WC   Neurological: He is alert and oriented to person, place, and time.  Skin: Skin is warm and dry. He is not diaphoretic. No erythema.     Labs reviewed: Basic Metabolic Panel:  Recent Labs  14/78/29 0535 08/14/13 0500 08/15/13 0530  NA 138 134* 134*  K 4.3 4.4 4.1  CL 102 99 100  CO2 20 20 23   GLUCOSE 218* 314* 178*  BUN 54* 59* 59*  CREATININE 1.89* 1.99* 1.75*  CALCIUM 9.3 9.3 9.3   Liver Function Tests: No results found for this basename: AST, ALT, ALKPHOS, BILITOT, PROT, ALBUMIN,  in the last 8760 hours No results found for this basename: LIPASE, AMYLASE,  in the last 8760 hours No results found for this basename: AMMONIA,  in the last 8760 hours CBC:  Recent Labs  08/11/13 1852 08/12/13 0430 08/14/13 0500 08/15/13 0530  WBC 8.6 9.3 9.0 9.6  NEUTROABS 6.3  --   --   --   HGB 9.6* 9.9* 9.4* 9.1*  HCT 29.4* 31.6* 29.4* 28.8*  MCV 85.5 86.1 84.7 85.5  PLT 227 206 184 190   Cardiac Enzymes: No results found for this basename: CKTOTAL,  CKMB, CKMBINDEX, TROPONINI,  in the last 8760 hours BNP: No components found with this basename: POCBNP,  CBG:  Recent Labs  08/14/13 2251 08/15/13 0716 08/15/13 1138  GLUCAP 234* 178* 227*    Iron and IBC  Result: 09/03/2013 1:30 PM ( Status: F )  Iron 29 L 42-165 ug/dL SLN  UIBC 562 130-865 ug/dL SLN  TIBC 784 696-295 ug/dL SLN  %SAT 10 L 28-41 % SLN  CBC with Diff  Result: 09/03/2013 2:40 PM ( Status: F )  WBC 6.4 4.0-10.5 K/uL SLN  RBC 3.69 L 4.22-5.81 MIL/uL SLN  Hemoglobin 9.2 L 13.0-17.0 g/dL SLN  Hematocrit 32.4 L 39.0-52.0 % SLN  MCV 81.6 78.0-100.0 fL SLN  MCH 24.9 L 26.0-34.0 pg SLN  MCHC 30.6 30.0-36.0 g/dL SLN  RDW 40.1 H 02.7-25.3 % SLN  Platelet Count 202 150-400 K/uL SLN  Granulocyte % 44 43-77 % SLN  Absolute Gran 2.8 1.7-7.7 K/uL SLN  Lymph % 46 12-46 % SLN  Absolute Lymph 3.0 0.7-4.0 K/uL SLN  Mono % 8 3-12 % SLN  Absolute Mono 0.5 0.1-1.0 K/uL SLN  Eos % 2 0-5 % SLN  Absolute Eos 0.1 0.0-0.7 K/uL SLN  Baso % 0 0-1 % SLN  Absolute Baso 0.0 0.0-0.1 K/uL SLN  Smear Review Criteria for review not met SLN  Ferritin  Result: 09/03/2013 2:56 PM ( Status: F )  Ferritin 102    Assessment/Plan  1. History of atrial flutter Stable  2. Hypertensive heart disease without CHF Stable on current medication   3. Type 2 diabetes mellitus with vascular disease Pt appears confused about what insulin he was taking at home; he does state he knows how to administer insulin. Will stop meal coverage and change pt to lantus 5 units qhs and have staff provide education; hh nursing  to also provide ongoing education; will need to follow up with PCP regarding adjustment for adequate glycemic control   4. Anemia Will cont ferrous sulfate 325 mg BID and to follow up CBC as outpt  5. Long term (current) use of anticoagulants Will cont 4 mg and have HH nursing check INR  6. Femoral neck fracture, unspecified laterality, sequela S/p right hip hemi- arthroplasty. pt is  stable for discharge-will need PT/OT/Nursing per home health. No DME needed. Rx written.  will need to follow up with PCP within 2 weeks.

## 2013-10-30 ENCOUNTER — Other Ambulatory Visit: Payer: Self-pay

## 2013-12-29 ENCOUNTER — Non-Acute Institutional Stay (SKILLED_NURSING_FACILITY): Payer: Medicare Other | Admitting: Internal Medicine

## 2013-12-29 DIAGNOSIS — G609 Hereditary and idiopathic neuropathy, unspecified: Secondary | ICD-10-CM

## 2013-12-29 DIAGNOSIS — G629 Polyneuropathy, unspecified: Secondary | ICD-10-CM

## 2013-12-29 DIAGNOSIS — I509 Heart failure, unspecified: Secondary | ICD-10-CM

## 2013-12-29 DIAGNOSIS — N184 Chronic kidney disease, stage 4 (severe): Secondary | ICD-10-CM
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
## 2014-01-08 ENCOUNTER — Non-Acute Institutional Stay (SKILLED_NURSING_FACILITY): Payer: Medicare Other | Admitting: Internal Medicine

## 2014-01-08 DIAGNOSIS — E1159 Type 2 diabetes mellitus with other circulatory complications: Secondary | ICD-10-CM

## 2014-01-08 DIAGNOSIS — R5381 Other malaise: Secondary | ICD-10-CM

## 2014-01-08 DIAGNOSIS — R5383 Other fatigue: Secondary | ICD-10-CM

## 2014-01-08 DIAGNOSIS — R05 Cough: Secondary | ICD-10-CM

## 2014-01-08 DIAGNOSIS — R197 Diarrhea, unspecified: Secondary | ICD-10-CM

## 2014-01-08 DIAGNOSIS — R059 Cough, unspecified: Secondary | ICD-10-CM

## 2014-01-08 DIAGNOSIS — I798 Other disorders of arteries, arterioles and capillaries in diseases classified elsewhere: Secondary | ICD-10-CM

## 2014-01-08 DIAGNOSIS — R531 Weakness: Secondary | ICD-10-CM

## 2014-01-09 ENCOUNTER — Encounter (HOSPITAL_COMMUNITY): Payer: Self-pay | Admitting: Emergency Medicine

## 2014-01-09 ENCOUNTER — Inpatient Hospital Stay (HOSPITAL_COMMUNITY)
Admission: EM | Admit: 2014-01-09 | Discharge: 2014-01-14 | DRG: 291 | Disposition: A | Discharge status: Skilled Nursing Facility | Payer: Medicare Other | Attending: Internal Medicine | Admitting: Internal Medicine

## 2014-01-09 ENCOUNTER — Emergency Department (HOSPITAL_COMMUNITY): Payer: Medicare Other

## 2014-01-09 DIAGNOSIS — M353 Polymyalgia rheumatica: Secondary | ICD-10-CM | POA: Diagnosis present

## 2014-01-09 DIAGNOSIS — F0280 Dementia in other diseases classified elsewhere without behavioral disturbance: Secondary | ICD-10-CM | POA: Diagnosis present

## 2014-01-09 DIAGNOSIS — I5033 Acute on chronic diastolic (congestive) heart failure: Secondary | ICD-10-CM

## 2014-01-09 DIAGNOSIS — I4892 Unspecified atrial flutter: Secondary | ICD-10-CM

## 2014-01-09 DIAGNOSIS — Z515 Encounter for palliative care: Secondary | ICD-10-CM

## 2014-01-09 DIAGNOSIS — Z66 Do not resuscitate: Secondary | ICD-10-CM | POA: Diagnosis present

## 2014-01-09 DIAGNOSIS — E039 Hypothyroidism, unspecified: Secondary | ICD-10-CM

## 2014-01-09 DIAGNOSIS — Z951 Presence of aortocoronary bypass graft: Secondary | ICD-10-CM

## 2014-01-09 DIAGNOSIS — I5023 Acute on chronic systolic (congestive) heart failure: Secondary | ICD-10-CM

## 2014-01-09 DIAGNOSIS — I129 Hypertensive chronic kidney disease with stage 1 through stage 4 chronic kidney disease, or unspecified chronic kidney disease: Secondary | ICD-10-CM | POA: Diagnosis present

## 2014-01-09 DIAGNOSIS — Z7901 Long term (current) use of anticoagulants: Secondary | ICD-10-CM

## 2014-01-09 DIAGNOSIS — Z794 Long term (current) use of insulin: Secondary | ICD-10-CM

## 2014-01-09 DIAGNOSIS — I798 Other disorders of arteries, arterioles and capillaries in diseases classified elsewhere: Secondary | ICD-10-CM | POA: Diagnosis present

## 2014-01-09 DIAGNOSIS — R531 Weakness: Secondary | ICD-10-CM

## 2014-01-09 DIAGNOSIS — D638 Anemia in other chronic diseases classified elsewhere: Secondary | ICD-10-CM | POA: Diagnosis present

## 2014-01-09 DIAGNOSIS — D696 Thrombocytopenia, unspecified: Secondary | ICD-10-CM | POA: Diagnosis present

## 2014-01-09 DIAGNOSIS — G9349 Other encephalopathy: Secondary | ICD-10-CM | POA: Diagnosis present

## 2014-01-09 DIAGNOSIS — D649 Anemia, unspecified: Secondary | ICD-10-CM

## 2014-01-09 DIAGNOSIS — I499 Cardiac arrhythmia, unspecified: Secondary | ICD-10-CM

## 2014-01-09 DIAGNOSIS — I2789 Other specified pulmonary heart diseases: Secondary | ICD-10-CM | POA: Diagnosis present

## 2014-01-09 DIAGNOSIS — Z96649 Presence of unspecified artificial hip joint: Secondary | ICD-10-CM

## 2014-01-09 DIAGNOSIS — Z7982 Long term (current) use of aspirin: Secondary | ICD-10-CM

## 2014-01-09 DIAGNOSIS — B20 Human immunodeficiency virus [HIV] disease: Secondary | ICD-10-CM

## 2014-01-09 DIAGNOSIS — E1159 Type 2 diabetes mellitus with other circulatory complications: Secondary | ICD-10-CM

## 2014-01-09 DIAGNOSIS — I2510 Atherosclerotic heart disease of native coronary artery without angina pectoris: Secondary | ICD-10-CM

## 2014-01-09 DIAGNOSIS — M797 Fibromyalgia: Secondary | ICD-10-CM

## 2014-01-09 DIAGNOSIS — Z79899 Other long term (current) drug therapy: Secondary | ICD-10-CM

## 2014-01-09 DIAGNOSIS — I4891 Unspecified atrial fibrillation: Secondary | ICD-10-CM | POA: Diagnosis present

## 2014-01-09 DIAGNOSIS — I5043 Acute on chronic combined systolic (congestive) and diastolic (congestive) heart failure: Principal | ICD-10-CM

## 2014-01-09 DIAGNOSIS — Z8679 Personal history of other diseases of the circulatory system: Secondary | ICD-10-CM

## 2014-01-09 DIAGNOSIS — R627 Adult failure to thrive: Secondary | ICD-10-CM | POA: Diagnosis present

## 2014-01-09 DIAGNOSIS — Z841 Family history of disorders of kidney and ureter: Secondary | ICD-10-CM

## 2014-01-09 DIAGNOSIS — R059 Cough, unspecified: Secondary | ICD-10-CM

## 2014-01-09 DIAGNOSIS — IMO0002 Reserved for concepts with insufficient information to code with codable children: Secondary | ICD-10-CM

## 2014-01-09 DIAGNOSIS — E43 Unspecified severe protein-calorie malnutrition: Secondary | ICD-10-CM

## 2014-01-09 DIAGNOSIS — E785 Hyperlipidemia, unspecified: Secondary | ICD-10-CM

## 2014-01-09 DIAGNOSIS — I509 Heart failure, unspecified: Secondary | ICD-10-CM | POA: Insufficient documentation

## 2014-01-09 DIAGNOSIS — Z9861 Coronary angioplasty status: Secondary | ICD-10-CM

## 2014-01-09 DIAGNOSIS — I119 Hypertensive heart disease without heart failure: Secondary | ICD-10-CM

## 2014-01-09 DIAGNOSIS — Z888 Allergy status to other drugs, medicaments and biological substances status: Secondary | ICD-10-CM

## 2014-01-09 DIAGNOSIS — Z8249 Family history of ischemic heart disease and other diseases of the circulatory system: Secondary | ICD-10-CM

## 2014-01-09 DIAGNOSIS — E871 Hypo-osmolality and hyponatremia: Secondary | ICD-10-CM | POA: Diagnosis not present

## 2014-01-09 DIAGNOSIS — Z950 Presence of cardiac pacemaker: Secondary | ICD-10-CM

## 2014-01-09 DIAGNOSIS — R197 Diarrhea, unspecified: Secondary | ICD-10-CM

## 2014-01-09 DIAGNOSIS — R4182 Altered mental status, unspecified: Secondary | ICD-10-CM

## 2014-01-09 DIAGNOSIS — E870 Hyperosmolality and hypernatremia: Secondary | ICD-10-CM

## 2014-01-09 DIAGNOSIS — I495 Sick sinus syndrome: Secondary | ICD-10-CM

## 2014-01-09 DIAGNOSIS — R05 Cough: Secondary | ICD-10-CM

## 2014-01-09 DIAGNOSIS — I701 Atherosclerosis of renal artery: Secondary | ICD-10-CM | POA: Diagnosis present

## 2014-01-09 DIAGNOSIS — N184 Chronic kidney disease, stage 4 (severe): Secondary | ICD-10-CM

## 2014-01-09 DIAGNOSIS — S72009A Fracture of unspecified part of neck of unspecified femur, initial encounter for closed fracture: Secondary | ICD-10-CM

## 2014-01-09 DIAGNOSIS — R64 Cachexia: Secondary | ICD-10-CM | POA: Diagnosis present

## 2014-01-09 LAB — CBC WITH DIFFERENTIAL/PLATELET
BASOS PCT: 0 % (ref 0–1)
Basophils Absolute: 0 10*3/uL (ref 0.0–0.1)
EOS ABS: 0.1 10*3/uL (ref 0.0–0.7)
EOS PCT: 1 % (ref 0–5)
HEMATOCRIT: 31.1 % — AB (ref 39.0–52.0)
HEMOGLOBIN: 9 g/dL — AB (ref 13.0–17.0)
LYMPHS PCT: 38 % (ref 12–46)
Lymphs Abs: 2.4 10*3/uL (ref 0.7–4.0)
MCH: 23.6 pg — ABNORMAL LOW (ref 26.0–34.0)
MCHC: 28.9 g/dL — ABNORMAL LOW (ref 30.0–36.0)
MCV: 81.6 fL (ref 78.0–100.0)
MONOS PCT: 11 % (ref 3–12)
Monocytes Absolute: 0.7 10*3/uL (ref 0.1–1.0)
NEUTROS PCT: 50 % (ref 43–77)
Neutro Abs: 3.2 10*3/uL (ref 1.7–7.7)
Platelets: 96 10*3/uL — ABNORMAL LOW (ref 150–400)
RBC: 3.81 MIL/uL — AB (ref 4.22–5.81)
RDW: 23.5 % — ABNORMAL HIGH (ref 11.5–15.5)
WBC: 6.4 10*3/uL (ref 4.0–10.5)

## 2014-01-09 LAB — COMPREHENSIVE METABOLIC PANEL
ALK PHOS: 283 U/L — AB (ref 39–117)
ALT: 10 U/L (ref 0–53)
AST: 20 U/L (ref 0–37)
Albumin: 2.3 g/dL — ABNORMAL LOW (ref 3.5–5.2)
BUN: 44 mg/dL — ABNORMAL HIGH (ref 6–23)
CALCIUM: 8.7 mg/dL (ref 8.4–10.5)
CHLORIDE: 112 meq/L (ref 96–112)
CO2: 26 meq/L (ref 19–32)
Creatinine, Ser: 1.98 mg/dL — ABNORMAL HIGH (ref 0.50–1.35)
GFR calc Af Amer: 36 mL/min — ABNORMAL LOW (ref >90)
GFR, EST NON AFRICAN AMERICAN: 31 mL/min — AB (ref >90)
Glucose, Bld: 157 mg/dL — ABNORMAL HIGH (ref 70–99)
POTASSIUM: 4.1 meq/L (ref 3.7–5.3)
SODIUM: 150 meq/L — AB (ref 137–147)
Total Bilirubin: 1.4 mg/dL — ABNORMAL HIGH (ref 0.3–1.2)
Total Protein: 6.3 g/dL (ref 6.0–8.3)

## 2014-01-09 LAB — URINE MICROSCOPIC-ADD ON

## 2014-01-09 LAB — TROPONIN I: Troponin I: <0.3 ng/mL (ref <0.30)

## 2014-01-09 LAB — URINALYSIS, ROUTINE W REFLEX MICROSCOPIC
Bilirubin Urine: NEGATIVE
Bilirubin Urine: NEGATIVE
GLUCOSE, UA: NEGATIVE mg/dL
GLUCOSE, UA: NEGATIVE mg/dL
HGB URINE DIPSTICK: NEGATIVE
Ketones, ur: NEGATIVE mg/dL
Ketones, ur: NEGATIVE mg/dL
Nitrite: NEGATIVE
Nitrite: NEGATIVE
PH: 5.5 (ref 5.0–8.0)
Protein, ur: NEGATIVE mg/dL
Protein, ur: NEGATIVE mg/dL
SPECIFIC GRAVITY, URINE: 1.015 (ref 1.005–1.030)
SPECIFIC GRAVITY, URINE: 1.008 (ref 1.005–1.030)
UROBILINOGEN UA: 0.2 mg/dL (ref 0.0–1.0)
Urobilinogen, UA: 1 mg/dL (ref 0.0–1.0)
pH: 5.5 (ref 5.0–8.0)

## 2014-01-09 LAB — PHOSPHORUS: Phosphorus: 2.7 mg/dL (ref 2.3–4.6)

## 2014-01-09 LAB — PROTIME-INR
INR: 1.8 — ABNORMAL HIGH (ref 0.00–1.49)
Prothrombin Time: 20.4 seconds — ABNORMAL HIGH (ref 11.6–15.2)

## 2014-01-09 LAB — PRO B NATRIURETIC PEPTIDE
Pro B Natriuretic peptide (BNP): 41214 pg/mL — ABNORMAL HIGH (ref 0–450)
Pro B Natriuretic peptide (BNP): 38688 pg/mL — ABNORMAL HIGH (ref 0–450)

## 2014-01-09 LAB — MAGNESIUM: Magnesium: 2.2 mg/dL (ref 1.5–2.5)

## 2014-01-09 LAB — GLUCOSE, CAPILLARY: Glucose-Capillary: 126 mg/dL — ABNORMAL HIGH (ref 70–99)

## 2014-01-09 MED ORDER — LEVOTHYROXINE SODIUM 75 MCG PO TABS
75.0000 ug | ORAL_TABLET | Freq: Every day | ORAL | Status: DC
  Administered 2014-01-10 – 2014-01-14 (×5): 75 ug via ORAL
  Filled 2014-01-09 (×6): qty 1

## 2014-01-09 MED ORDER — CARVEDILOL 12.5 MG PO TABS
12.5000 mg | ORAL_TABLET | Freq: Two times a day (BID) | ORAL | Status: DC
  Administered 2014-01-10 – 2014-01-14 (×9): 12.5 mg via ORAL
  Filled 2014-01-09 (×11): qty 1

## 2014-01-09 MED ORDER — OXYCODONE HCL 5 MG PO TABS
5.0000 mg | ORAL_TABLET | ORAL | Status: DC | PRN
  Filled 2014-01-09: qty 1

## 2014-01-09 MED ORDER — SODIUM CHLORIDE 0.9 % IJ SOLN
3.0000 mL | Freq: Two times a day (BID) | INTRAMUSCULAR | Status: DC
  Administered 2014-01-10 (×2): 3 mL via INTRAVENOUS

## 2014-01-09 MED ORDER — SENNOSIDES-DOCUSATE SODIUM 8.6-50 MG PO TABS
2.0000 | ORAL_TABLET | Freq: Two times a day (BID) | ORAL | Status: DC
  Administered 2014-01-09 – 2014-01-14 (×8): 2 via ORAL
  Filled 2014-01-09 (×11): qty 2

## 2014-01-09 MED ORDER — RITONAVIR 100 MG PO TABS
100.0000 mg | ORAL_TABLET | Freq: Two times a day (BID) | ORAL | Status: DC
  Administered 2014-01-09 – 2014-01-14 (×10): 100 mg via ORAL
  Filled 2014-01-09 (×12): qty 1

## 2014-01-09 MED ORDER — ENOXAPARIN SODIUM 80 MG/0.8ML ~~LOC~~ SOLN
1.0000 mg/kg | Freq: Every day | SUBCUTANEOUS | Status: DC
  Administered 2014-01-09 – 2014-01-13 (×5): 70 mg via SUBCUTANEOUS
  Filled 2014-01-09 (×6): qty 0.8

## 2014-01-09 MED ORDER — ASPIRIN EC 81 MG PO TBEC
81.0000 mg | DELAYED_RELEASE_TABLET | Freq: Every day | ORAL | Status: DC
  Administered 2014-01-10 – 2014-01-14 (×5): 81 mg via ORAL
  Filled 2014-01-09 (×5): qty 1

## 2014-01-09 MED ORDER — ONDANSETRON HCL 4 MG/2ML IJ SOLN: 4.0000 mg | Freq: Four times a day (QID) | INTRAMUSCULAR | Status: DC | PRN

## 2014-01-09 MED ORDER — SODIUM CHLORIDE 0.9 % IJ SOLN
3.0000 mL | Freq: Two times a day (BID) | INTRAMUSCULAR | Status: DC
  Administered 2014-01-09: 3 mL via INTRAVENOUS
  Administered 2014-01-10: 11:00:00 via INTRAVENOUS
  Administered 2014-01-10 – 2014-01-14 (×8): 3 mL via INTRAVENOUS

## 2014-01-09 MED ORDER — SODIUM CHLORIDE 0.9 % IV SOLN: INTRAVENOUS | Status: DC

## 2014-01-09 MED ORDER — PREDNISONE 5 MG PO TABS
7.5000 mg | ORAL_TABLET | Freq: Every day | ORAL | Status: DC
  Administered 2014-01-10 – 2014-01-14 (×5): 7.5 mg via ORAL
  Filled 2014-01-09 (×6): qty 1

## 2014-01-09 MED ORDER — ETRAVIRINE 100 MG PO TABS
200.0000 mg | ORAL_TABLET | Freq: Two times a day (BID) | ORAL | Status: DC
  Administered 2014-01-09: 200 mg via ORAL
  Filled 2014-01-09 (×3): qty 2

## 2014-01-09 MED ORDER — SULFAMETHOXAZOLE-TMP DS 800-160 MG PO TABS
1.0000 | ORAL_TABLET | ORAL | Status: DC
  Administered 2014-01-09 – 2014-01-14 (×3): 1 via ORAL
  Filled 2014-01-09 (×3): qty 1

## 2014-01-09 MED ORDER — LAMIVUDINE 150 MG PO TABS: 150.0000 mg | ORAL_TABLET | Freq: Every day | ORAL | Status: DC

## 2014-01-09 MED ORDER — FUROSEMIDE 10 MG/ML IJ SOLN
40.0000 mg | Freq: Every day | INTRAMUSCULAR | Status: DC
  Administered 2014-01-09 – 2014-01-10 (×2): 40 mg via INTRAVENOUS
  Filled 2014-01-09 (×3): qty 4

## 2014-01-09 MED ORDER — ONDANSETRON HCL 4 MG PO TABS: 4.0000 mg | ORAL_TABLET | Freq: Four times a day (QID) | ORAL | Status: DC | PRN

## 2014-01-09 MED ORDER — SODIUM CHLORIDE 0.9 % IV SOLN: 250.0000 mL | INTRAVENOUS | Status: DC | PRN

## 2014-01-09 MED ORDER — SODIUM CHLORIDE 0.9 % IJ SOLN: 3.0000 mL | INTRAMUSCULAR | Status: DC | PRN

## 2014-01-09 MED ORDER — FERROUS SULFATE 325 (65 FE) MG PO TABS
325.0000 mg | ORAL_TABLET | Freq: Two times a day (BID) | ORAL | Status: DC
  Administered 2014-01-10 – 2014-01-11 (×3): 325 mg via ORAL
  Filled 2014-01-09 (×5): qty 1

## 2014-01-09 MED ORDER — PANTOPRAZOLE SODIUM 40 MG PO TBEC
40.0000 mg | DELAYED_RELEASE_TABLET | Freq: Every day | ORAL | Status: DC
  Administered 2014-01-10 – 2014-01-14 (×5): 40 mg via ORAL
  Filled 2014-01-09 (×5): qty 1

## 2014-01-09 MED ORDER — DARUNAVIR ETHANOLATE 600 MG PO TABS: 600.0000 mg | ORAL_TABLET | Freq: Two times a day (BID) | ORAL | Status: DC

## 2014-01-09 MED ORDER — FUROSEMIDE 10 MG/ML IJ SOLN
80.0000 mg | Freq: Once | INTRAMUSCULAR | Status: AC
  Administered 2014-01-09: 80 mg via INTRAVENOUS
  Filled 2014-01-09: qty 8

## 2014-01-09 MED ORDER — INSULIN NPH (HUMAN) (ISOPHANE) 100 UNIT/ML ~~LOC~~ SUSP
5.0000 [IU] | Freq: Every day | SUBCUTANEOUS | Status: DC
  Administered 2014-01-09 – 2014-01-13 (×3): 5 [IU] via SUBCUTANEOUS
  Filled 2014-01-09: qty 10

## 2014-01-09 MED ORDER — ALLOPURINOL 150 MG HALF TABLET
250.0000 mg | ORAL_TABLET | Freq: Every day | ORAL | Status: DC
  Administered 2014-01-10 – 2014-01-14 (×5): 250 mg via ORAL
  Filled 2014-01-09 (×5): qty 1

## 2014-01-09 MED ORDER — ENOXAPARIN SODIUM 80 MG/0.8ML ~~LOC~~ SOLN
1.0000 mg/kg | Freq: Once | SUBCUTANEOUS | Status: DC
  Filled 2014-01-09: qty 0.8

## 2014-01-09 MED ORDER — ATORVASTATIN CALCIUM 10 MG PO TABS
10.0000 mg | ORAL_TABLET | Freq: Every day | ORAL | Status: DC
  Administered 2014-01-09 – 2014-01-13 (×5): 10 mg via ORAL
  Filled 2014-01-09 (×6): qty 1

## 2014-01-09 MED ORDER — INSULIN ASPART 100 UNIT/ML ~~LOC~~ SOLN
0.0000 [IU] | Freq: Three times a day (TID) | SUBCUTANEOUS | Status: DC
Start: 2014-01-10 — End: 2014-01-14
  Administered 2014-01-10 – 2014-01-11 (×4): 1 [IU] via SUBCUTANEOUS
  Administered 2014-01-12 (×2): 2 [IU] via SUBCUTANEOUS
  Administered 2014-01-13 (×2): 3 [IU] via SUBCUTANEOUS
  Administered 2014-01-14: 2 [IU] via SUBCUTANEOUS

## 2014-01-09 MED ORDER — DARUNAVIR ETHANOLATE 600 MG PO TABS
600.0000 mg | ORAL_TABLET | Freq: Two times a day (BID) | ORAL | Status: DC
  Administered 2014-01-09 – 2014-01-14 (×10): 600 mg via ORAL
  Filled 2014-01-09 (×14): qty 1

## 2014-01-09 MED ORDER — HYDRALAZINE HCL 10 MG PO TABS
10.0000 mg | ORAL_TABLET | Freq: Three times a day (TID) | ORAL | Status: DC
  Administered 2014-01-09 – 2014-01-14 (×13): 10 mg via ORAL
  Filled 2014-01-09 (×17): qty 1

## 2014-01-09 NOTE — Consult Note (Signed)
CARDIOLOGY CONSULT NOTE  Patient ID: [name] Proctor MRN: [id number] DOB/AGE: [birth year] 79 y.o.  Admit date: 01/09/2014 Primary Physician Dr. Leanord Hawking Primary Cardiologist Dr. [place] Chief Complaint  Cough and SOB  HPI:  The patient has a history of CAD/CABG, atrial flutter, pacemaker and diastolic HF.   He presents to the ER today with SOB.  I am unable to get any history from the patient because of dementia.  There is no family.  I contacted the nursing home.  The nurse did not see him today but the report that the patient has been very weak.  He apparently was SOB and there was an abnormal CXR.  However, she could not give me further details.  Apparently, per the ER MD, he was sent because of dyspnea and an abnormal CXR.  He was apparently dyspneic when he presented and was treated with IV lasix.  He is now breathing better.  He denies any pain or SOB but he [place] unable to give me any history.   In the ER he was found to have [place] proBNP of 69629.   CXR shows pulmonary edema.   Na is elevated at 150.  BUN and creatinine are elevated above the baseline.    All other information is obtained from previous notes available in Epic.   Past Medical History  Diagnosis Date  . CHF (congestive heart failure)     a. h/o diastolic dysfunction. b. EF 45% by echo 06/2012.  Marland Kitchen History of diastolic dysfunction   . SSS (sick sinus syndrome)   . Coronary artery disease     s/p CABG 2010  . History of atrial flutter     s/p ablation  . Chronic renal insufficiency   . Diabetes mellitus type 2, insulin dependent   . History of GI bleed   . Gastritis   . Esophagitis   . Duodenal ulcer   . IDA (iron deficiency anemia)   . Hypertension   . Hyperlipidemia   . Hypothyroidism   . HIV positive   . RAS (renal artery stenosis)   . pacemaker-MDT     Medtronic EnRhythm generator serial [id number] H 2007 Dr. Reyes Ivan. Replaced at [facility] 2012     . Atrial fibrillation     By pacemaker interrogation 06/2012 - device  reprogrammed     Past Surgical History  Procedure Laterality Date  . Cardiac catheterization  02/22/2009    EF 40-45%  . Coronary angioplasty  03/06/2006    INTRACORONARY STENTING OF SAPHENOUS VEIN GRAFT TO THE PDA AND INTRACORONARY STENTING OF THE SAPHENOUS VEIN GRAFT TO THE DIAGONAL  . Insert / replace / remove pacemaker      IMPLANT  . Ablation saphenous vein w/ rfa    . Renal artery stent      LEFT  . Coronary artery bypass graft  02/2009    REDO. NEW SAPHENOUS VEIN GRAFT TO THE OBTUSE MARGINAL VESSEL AND A SEQUENTIAL SAPHENOUS VEIN GRAFT TO THE PDA AND POSTERIOR LATERAL BRANCHES OF THE RIGHT CORONARY. THE LIMA GRAFT FROM HIS ORIGINAL SURGERY WAS STILL PATENT.  Marland Kitchen Pacemaker generator change  3/12    done at [facility]  . Total hip arthroplasty Right 08/13/2013    Procedure: Right Hip Hemiarthroplasty;  Surgeon: Sheral Apley, MD;  Location: [facility] OR;  Service: Orthopedics;  Laterality: Right;    Allergies  Allergen Reactions  . Darvocet [Propoxyphene N-Acetaminophen] Nausea And Vomiting  . Seldane [Terfenadine] Nausea And Vomiting  . Sulfonylureas Other (See Comments)  Low grade fever  . Ultracet [Tramadol-Acetaminophen] Nausea And Vomiting   Prior to Admission medications   Medication Sig Start Date End Date Taking? Authorizing Provider  allopurinol (ZYLOPRIM) 100 MG tablet Take 250 mg by mouth daily.    Yes Historical Provider, MD  aspirin EC 81 MG tablet Take 81 mg by mouth daily.   Yes Historical Provider, MD  atorvastatin (LIPITOR) 10 MG tablet Take 10 mg by mouth at bedtime.    Yes Historical Provider, MD  Calcium Carbonate-Vitamin D (CALCIUM 600-D) 600-400 MG-UNIT per tablet Take 2 tablets by mouth every morning.   Yes Historical Provider, MD  carvedilol (COREG) 25 MG tablet Take 12.5 mg by mouth 2 (two) times daily with a meal.    Yes Historical Provider, MD  darunavir (PREZISTA) 600 MG tablet Take 600 mg by mouth 2 (two) times daily.   Yes Historical Provider, MD  ENOXAPARIN  SODIUM IJ Inject 0.7 mg as directed.   Yes Historical Provider, MD  etravirine (INTELENCE) 100 MG tablet Take 100 mg by mouth 2 (two) times daily.   Yes Historical Provider, MD  ferrous sulfate 325 (65 FE) MG tablet Take 325 mg by mouth 2 (two) times daily with a meal.   Yes Historical Provider, MD  furosemide (LASIX) 40 MG tablet Take 1 tablet (40 mg total) by mouth daily. 08/15/13  Yes Ripudeep Jenna Luo, MD  hydrALAZINE (APRESOLINE) 10 MG tablet Take 10 mg by mouth every 8 (eight) hours.   Yes Historical Provider, MD  insulin NPH Human (HUMULIN N,NOVOLIN N) 100 UNIT/ML injection Inject 5 Units into the skin at bedtime.   Yes Historical Provider, MD  insulin regular (NOVOLIN R,HUMULIN R) 100 units/mL injection Inject 3 Units into the skin 3 (three) times daily before meals.   Yes Historical Provider, MD  levothyroxine (SYNTHROID, LEVOTHROID) 75 MCG tablet Take 75 mcg by mouth daily before breakfast.   Yes Historical Provider, MD  omeprazole (PRILOSEC) 20 MG capsule Take 20 mg by mouth 2 (two) times daily before a meal.   Yes Historical Provider, MD  PREDNISONE PO Take 7.5 mg by mouth daily.   Yes Historical Provider, MD  ritonavir (NORVIR) 100 MG TABS tablet Take 100 mg by mouth 2 (two) times daily.   Yes Historical Provider, MD  senna-docusate (SENOKOT-S) 8.6-50 MG per tablet Take 2 tablets by mouth 2 (two) times daily.   Yes Historical Provider, MD  sulfamethoxazole-trimethoprim (BACTRIM DS) 800-160 MG per tablet Take 1 tablet by mouth every Monday, Wednesday, and Friday.   Yes Historical Provider, MD  lamiVUDine (EPIVIR) 150 MG tablet Take 150 mg by mouth daily.    Historical Provider, MD  omeprazole (PRILOSEC) 20 MG capsule Take 20 mg by mouth 2 (two) times daily.    Historical Provider, MD     (Not in a hospital admission) Family History  Problem Relation Age of Onset  . Kidney disease Mother   . Hypertension Father   . Crohn's disease Daughter   . Lupus Sister     History   Social  History  . Marital Status: Divorced    Spouse Name: N/A    Number of Children: N/A  . Years of Education: N/A   Occupational History  . Not on file.   Social History Main Topics  . Smoking status: Never Smoker   . Smokeless tobacco: Never Used  . Alcohol Use: No  . Drug Use: No  . Sexual Activity: Not Currently   Other Topics Concern  .  Not on file   Social History Narrative   One daughter.  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 rate 20, SpO2 95.00%.  GENERAL:  Chronically ill appearing and cachectic.  HEENT:  Pupils equal round and reactive, fundi not visualized, oral mucosa dry NECK:  Waveform within normal limits, JVD of 10 cm at 45 degrees, carotid upstroke brisk and symmetric, no bruits, no thyromegaly LYMPHATICS:  No cervical, inguinal adenopathy LUNGS:  Decreased breath sounds BACK:  No CVA tenderness CHEST:  Unremarkable HEART:  PMI not displaced or sustained,S1 and S2 within normal limits, no S3, no S4, no clicks, no rubs, distant heart sounds no obvious murmurs ABD:  Flat, positive bowel sounds normal in frequency in pitch, no bruits, no rebound, no guarding, no midline pulsatile mass, no hepatomegaly, no splenomegaly EXT:  Severe muscle wasting.  SKIN:  No rashes no nodules NEURO:  Cranial nerves II through XII grossly intact, motor grossly intact throughout PSYCH:  Aware of his name and little else   Labs: Lab Results  Component Value Date   BUN 44* 01/09/2014   Lab Results  Component Value Date   CREATININE 1.98* 01/09/2014   Lab Results  Component Value Date   NA 150* 01/09/2014   K 4.1 01/09/2014   CL 112 01/09/2014   CO2 26 01/09/2014   Lab Results  Component Value Date   TROPONINI <0.30 01/09/2014   Lab Results  Component Value Date   WBC 6.4 01/09/2014   HGB 9.0* 01/09/2014   HCT 31.1* 01/09/2014   MCV 81.6  01/09/2014   PLT 96* 01/09/2014    Lab Results  Component Value Date   ALT 10 01/09/2014   AST 20 01/09/2014   ALKPHOS 283* 01/09/2014   BILITOT 1.4* 01/09/2014      Radiology:   CXR:  Congestive heart failure with moderately severe pulmonary edema  EKG:   RRR, rate 70, LBBB (no pacing spikes seen.)  ASSESSMENT AND PLAN:   ACUTE ON CHRONIC SYSTOLIC HF:  Overall the patient appears to be be volume contracted despite the BNP and CXR.  He is quite comfortable now.  I would reassess the oxygenation and labs in the AM before further dosing the diuretic.  It is reasonable to get an echocardiogram to further assess the EF since the picture is somewhat confused.  At this point I have no further suggestion for change in therapy.    SignedRollene Rotunda: Sarh Kirschenbaum 01/09/2014, 6:01 PM

## 2014-01-09 NOTE — ED Notes (Signed)
[facility]. EMS reports they were called out for an abnormal chest x-ray yesterday. On exam, pt with wet cough.

## 2014-01-09 NOTE — ED Notes (Signed)
MD at bedside. Dr. Zackowski. 

## 2014-01-09 NOTE — Progress Notes (Signed)
Patient received from OSH, transported via stretcher, alert but disoriented.  No signs of shortness of breath and chest pains.  Not in any O2 support.  Foley catheter draining to urine bag noted. Placed comfortably in bed, instructed to call for assistance and concerns.  Patient able to follow commands.  Bed alarm turned on.  Will monitor and evaluate as needed.

## 2014-01-09 NOTE — Progress Notes (Addendum)
ANTICOAGULATION CONSULT NOTE - Initial Consult  Pharmacy Consult for lovenox Indication: atrial fibrillation  Allergies  Allergen Reactions  . Darvocet [Propoxyphene N-Acetaminophen] Nausea And Vomiting  . Seldane [Terfenadine] Nausea And Vomiting  . Sulfonylureas Other (See Comments)    Low grade fever  . Ultracet [Tramadol-Acetaminophen] Nausea And Vomiting    Patient Measurements: Height: 6' 2.02" (188 cm) Weight: 163 lb 2.3 oz (74 kg) IBW/kg (Calculated) : 82.24 Heparin Dosing Weight:   Vital Signs: Temp: 98 F (36.7 C) (01/16 1415) BP: 139/71 mmHg (01/16 1900) Pulse Rate: 71 (01/16 1900)  Labs:  Recent Labs  01/09/14 1407 01/09/14 1424  HGB 9.0*  --   HCT 31.1*  --   PLT 96*  --   CREATININE 1.98*  --   TROPONINI  --  <0.30    Estimated Creatinine Clearance: 33.2 ml/min (by C-G formula based on Cr of 1.98).   Medical History: Past Medical History  Diagnosis Date  . CHF (congestive heart failure)     a. h/o diastolic dysfunction. b. EF 45% by echo 06/2012.  Marland Kitchen. History of diastolic dysfunction   . SSS (sick sinus syndrome)   . Coronary artery disease     s/p CABG 2010  . History of atrial flutter     s/p ablation  . Chronic renal insufficiency   . Diabetes mellitus type 2, insulin dependent   . History of GI bleed   . Gastritis   . Esophagitis   . Duodenal ulcer   . IDA (iron deficiency anemia)   . Hypertension   . Hyperlipidemia   . Hypothyroidism   . HIV positive   . RAS (renal artery stenosis)   . pacemaker-MDT     Medtronic EnRhythm generator serial [id number] H 2007 Dr. Reyes IvanKersey. [facility]     . Atrial fibrillation     By pacemaker interrogation 06/2012 - device reprogrammed     Medications:  Lovenox injections pta  Assessment: 79 year [facility] weakness, increasing shortness of breath, and abnormal CXR. Patient is on chronic coumadin, per records patient has been receiving sq lovenox injections. INR low at  1.8, will continue lovenox.  Goal of Therapy:  INR 2-3 Anti-Xa level 0.6-1 units/ml 4hrs after LMWH dose given Monitor platelets by anticoagulation protocol: Yes   Plan:  INR now = 1.8, Continue lovenox 70mg  qhs CBC q 72 hours while on lovenox Follow up transition back to coumadin   Severiano GilbertWilson, Frank Rhea 01/09/2014,7:37 PM

## 2014-01-09 NOTE — ED Notes (Signed)
Patient transported to X-ray 

## 2014-01-09 NOTE — H&P (Addendum)
[facility] History and Physical  [name] BirchwoodWilliam E Proctor WUJ:[id number] DOB: 30-Oct-1935 DOA: 01/09/2014  Referring physician: ED physician PCP: Dr. Leanord Hawkingobson    Chief Complaint: shortness of breath   HPI:  Pt is 79 yo male, [facility], with multiple and very complex medical problems including systolic CHF per last 2 D ECHO 06/2012 with EF 45%, atrial fibrillation on chronic Coumadin, diabetes mellitus requiring insulin, HTN, HIV on HAART, who [facility] due to concern of abnormal CXR obtained at the facility 01/08/2013. Please note that pt is unable to provide much history and no family at bedside. Most of the information obtained from available records and ED physician. There has been report of productive cough, progressive deconditioning, poor oral intake, failure to thrive. No reported abdominal or urinary concerns.   In ED, pt hemodynamically stable, per ED doctor, crackles and rhonchi on lung exam noted, CXR consistent with pulmonary vascular congestion, proBNP > 40000. TR[facility] asked to admit to telemetry bed and cardiology was consulted for assistance.   Assessment and Plan:  Principal Problem:   Acute on chronic congestive heart failure, systolic and diastolic  - per last 2 D ECHO 95/621307/2013 --> EF 45% with indeterminant diastolic function, moderate pulmonary hypertension - will admit pt to telemetry bed - place on Lasix 40 mg IV - monitor daily weights, I's and O's, weight on admission 163 lbs - order 2 D ECHO, daily BMP - continue Hydralazine 10 mg TID and Coreg 12.5 mg BID - follow u pon cardiology recommendations   Active Problems:   Hypernatremia - unclear etiology - lasix may help with this but this may simply be secondary to dehydration and pre renal etiology - saline lock for now and check BMP in AM   Altered mental state - this is likely multifactorial and secondary to progressive failure to  thrive, deconditioning, hypernatremia, acute illness as noted above - management as noted above - will need PT once able to tolerate    Chronic kidney disease stage 4 - last Cr (07/2013) 1.75, so now slightly above the baseline - close monitoring of renal function as pt placed on Lasix  - check BMP in AM   Type 2 diabetes mellitus with vascular disease - will check A1C - continue home medical regimen and place on SSI as well    Anemia of chronic disease  - review of records indicate baseline hg ~9, so currently Hg/Hct stable - CBC in AM   Thrombocytopenia - per record review Plt in august 2014 WNL and now down - etiology of this is unclear but some antiretroviral medications can cause low platelet count - no signs of active bleeding - since pt is on Lovenox for atrial fibrillation will have to closely monitor Plt counts - CBC in AM   HIV disease - will check CD4#  - continue HAART: Prezista, Intelence, Ritonavir, Epivir    Hypothyroidism - will check TSH and continue home medical regimen with Synthroid    Long term (current) use of anticoagulants for atrial fibrillation - place on Lovenox and plan on transitioning to Coumadin    ? Polymyalgia rheumatica - on 7.5 mg tablet PO QD that was apparently transitioned to 5 mg PO QD - will ask for the records and will readjust the regimen as indicated - will place on 7.5 mg for now as we have it in the file   Radiological Exams on Admission: CXR  01/09/2014   Congestive heart failure with moderately severe pulmonary edema    Code Status: Full Family Communication: no family at bedside  Disposition Plan: Admit to telemetry bed   Review of Systems:  Unable to obtain due to altered mental status     Past Medical History  Diagnosis Date  . CHF (congestive heart failure)     a. h/o diastolic dysfunction. b. EF 45% by echo 06/2012.  Marland Kitchen History of diastolic dysfunction   . SSS (sick sinus syndrome)   . Coronary artery disease     s/p  CABG 2010  . History of atrial flutter     s/p ablation  . Chronic renal insufficiency   . Diabetes mellitus type 2, insulin dependent   . History of GI bleed   . Gastritis   . Esophagitis   . Duodenal ulcer   . IDA (iron deficiency anemia)   . Hypertension   . Hyperlipidemia   . Hypothyroidism   . HIV positive   . RAS (renal artery stenosis)   . pacemaker-MDT     Medtronic EnRhythm generator serial [id number] H 2007 Dr. Reyes Ivan. Replaced at [facility] 2012     . Atrial fibrillation     By pacemaker interrogation 06/2012 - device reprogrammed     Past Surgical History  Procedure Laterality Date  . Cardiac catheterization  02/22/2009    EF 40-45%  . Coronary angioplasty  03/06/2006    INTRACORONARY STENTING OF SAPHENOUS VEIN GRAFT TO THE PDA AND INTRACORONARY STENTING OF THE SAPHENOUS VEIN GRAFT TO THE DIAGONAL  . Insert / replace / remove pacemaker      IMPLANT  . Ablation saphenous vein w/ rfa    . Renal artery stent      LEFT  . Coronary artery bypass graft  02/2009    REDO. NEW SAPHENOUS VEIN GRAFT TO THE OBTUSE MARGINAL VESSEL AND A SEQUENTIAL SAPHENOUS VEIN GRAFT TO THE PDA AND POSTERIOR LATERAL BRANCHES OF THE RIGHT CORONARY. THE LIMA GRAFT FROM HIS ORIGINAL SURGERY WAS STILL PATENT.  Marland Kitchen Pacemaker generator change  3/12    done at [facility]  . Total hip arthroplasty Right 08/13/2013    Procedure: Right Hip Hemiarthroplasty;  Surgeon: Sheral Apley, MD;  Location: [facility] OR;  Service: Orthopedics;  Laterality: Right;    Social History:  reports that he has never smoked. He has never used smokeless tobacco. He reports that he does not drink alcohol or use illicit drugs.  Allergies  Allergen Reactions  . Darvocet [Propoxyphene N-Acetaminophen] Nausea And Vomiting  . Seldane [Terfenadine] Nausea And Vomiting  . Sulfonylureas Other (See Comments)    Low grade fever  . Ultracet [Tramadol-Acetaminophen] Nausea And Vomiting    Family History  Problem Relation Age of Onset  . Kidney  disease Mother   . Hypertension Father   . Crohn's disease Daughter   . Lupus Sister     Medication Sig  allopurinol (ZYLOPRIM) 100 MG tablet Take 250 mg by mouth daily.   aspirin EC 81 MG tablet Take 81 mg by mouth daily.  atorvastatin (LIPITOR) 10 MG tablet Take 10 mg by mouth at bedtime.   carvedilol (COREG) 25 MG tablet Take 12.5 mg 2 (two) times daily with a meal.   darunavir (PREZISTA) 600 MG tablet Take 600 mg by mouth 2 (two) times daily.  ENOXAPARIN SODIUM IJ Inject 0.7 mg as directed.  etravirine (INTELENCE) 100 MG tablet Take 100 mg by mouth 2 (two) times daily.  ferrous sulfate 325 (65  FE) MG tablet Take 325 mg 2 (two) times daily with a meal.  furosemide (LASIX) 40 MG tablet Take 1 tablet (40 mg total) by mouth daily.  hydrALAZINE 10 MG tablet Take 10 mg by mouth every 8 (eight) hours.  insulin NPH injection Inject 5 Units into the skin at bedtime.  insulin regular  Inject 3 Units  3  times daily before meals.  levothyroxine 75 MCG tablet Take 75 mcg by mouth daily before breakfast.  omeprazole  20 MG capsule Take 20 mg 2 (two) times daily before a meal.  PREDNISONE PO Take 7.5 mg by mouth daily.  ritonavir  100 MG TABS tablet Take 100 mg by mouth 2 (two) times daily.  senna-docusate  8.6-50 MG per tablet Take 2 tablets by mouth 2 (two) times daily.  Bactrim  Take 1 tab every Mon, Wed, and Friday.  lamiVUDine (EPIVIR) 150 MG tablet Take 150 mg by mouth daily.  omeprazole  20 MG capsule Take 20 mg by mouth 2 (two) times daily.    Physical Exam: Filed Vitals:   01/09/14 1530 01/09/14 1600 01/09/14 1630 01/09/14 1700  BP: 119/62 116/58 121/57 128/70  Pulse: 61 65 126 65  Temp:      Resp: 19  23   SpO2: 97% 97% 99% 98%    Physical Exam  Constitutional: Appears chronically ill, alert and oriented to name, cachectic  HENT: Normocephalic. External right and left ear normal. Dry MM Eyes: Conjunctivae and EOM are normal. PERRLA, no scleral icterus.  Neck: Normal ROM.  Neck supple. No tracheal deviation. No thyromegaly.  CVS: S1/S2, JVD + Pulmonary: Bibasilar crackles with scattered rhonchi, no wheezing  Abdominal: Soft. BS +,  no distension, tenderness, rebound or guarding.  Musculoskeletal: No tenderness, muscles waisting  Lymphadenopathy: No lymphadenopathy noted, cervical, inguinal. Neuro: No gross focal deficits noted  Skin: Skin is warm and dry. No rash noted. Not diaphoretic. No erythema. No pallor.  Psychiatric: unable to assess as pt not able to participate in conversation.   Labs on Admission:  Basic Metabolic Panel:  Recent Labs Lab 01/09/14 1407  NA 150*  K 4.1  CL 112  CO2 26  GLUCOSE 157*  BUN 44*  CREATININE 1.98*  CALCIUM 8.7   Liver Function Tests:  Recent Labs Lab 01/09/14 1407  AST 20  ALT 10  ALKPHOS 283*  BILITOT 1.4*  PROT 6.3  ALBUMIN 2.3*   CBC:  Recent Labs Lab 01/09/14 1407  WBC 6.4  NEUTROABS 3.2  HGB 9.0*  HCT 31.1*  MCV 81.6  PLT 96*   Cardiac Enzymes:  Recent Labs Lab 01/09/14 1424  TROPONINI <0.30   EKG: Normal sinus rhythm, no ST/T wave changes  Debbora Presto, MD  [facility] Pager [phone]  If 7PM-7AM, please contact night-coverage [email or web address] Password [facility] 01/09/2014, 5:51 PM

## 2014-01-10 DIAGNOSIS — B20 Human immunodeficiency virus [HIV] disease: Secondary | ICD-10-CM

## 2014-01-10 DIAGNOSIS — I5043 Acute on chronic combined systolic (congestive) and diastolic (congestive) heart failure: Principal | ICD-10-CM

## 2014-01-10 DIAGNOSIS — R531 Weakness: Secondary | ICD-10-CM | POA: Insufficient documentation

## 2014-01-10 DIAGNOSIS — R059 Cough, unspecified: Secondary | ICD-10-CM | POA: Insufficient documentation

## 2014-01-10 DIAGNOSIS — N184 Chronic kidney disease, stage 4 (severe): Secondary | ICD-10-CM

## 2014-01-10 DIAGNOSIS — E870 Hyperosmolality and hypernatremia: Secondary | ICD-10-CM

## 2014-01-10 DIAGNOSIS — R197 Diarrhea, unspecified: Secondary | ICD-10-CM | POA: Insufficient documentation

## 2014-01-10 DIAGNOSIS — Z7901 Long term (current) use of anticoagulants: Secondary | ICD-10-CM

## 2014-01-10 DIAGNOSIS — R05 Cough: Secondary | ICD-10-CM | POA: Insufficient documentation

## 2014-01-10 LAB — PROTIME-INR
INR: 1.89 — AB (ref 0.00–1.49)
Prothrombin Time: 21.1 seconds — ABNORMAL HIGH (ref 11.6–15.2)

## 2014-01-10 LAB — BASIC METABOLIC PANEL
BUN: 43 mg/dL — ABNORMAL HIGH (ref 6–23)
CALCIUM: 9 mg/dL (ref 8.4–10.5)
CO2: 25 mEq/L (ref 19–32)
Chloride: 114 mEq/L — ABNORMAL HIGH (ref 96–112)
Creatinine, Ser: 1.93 mg/dL — ABNORMAL HIGH (ref 0.50–1.35)
GFR calc Af Amer: 37 mL/min — ABNORMAL LOW (ref >90)
GFR, EST NON AFRICAN AMERICAN: 32 mL/min — AB (ref >90)
GLUCOSE: 87 mg/dL (ref 70–99)
POTASSIUM: 4.1 meq/L (ref 3.7–5.3)
SODIUM: 151 meq/L — AB (ref 137–147)

## 2014-01-10 LAB — CBC
HCT: 30.8 % — ABNORMAL LOW (ref 39.0–52.0)
HEMOGLOBIN: 9 g/dL — AB (ref 13.0–17.0)
MCH: 23.4 pg — AB (ref 26.0–34.0)
MCHC: 29.2 g/dL — ABNORMAL LOW (ref 30.0–36.0)
MCV: 80 fL (ref 78.0–100.0)
PLATELETS: 101 10*3/uL — AB (ref 150–400)
RBC: 3.85 MIL/uL — AB (ref 4.22–5.81)
RDW: 23.9 % — ABNORMAL HIGH (ref 11.5–15.5)
WBC: 7.7 10*3/uL (ref 4.0–10.5)

## 2014-01-10 LAB — GLUCOSE, CAPILLARY
GLUCOSE-CAPILLARY: 76 mg/dL (ref 70–99)
GLUCOSE-CAPILLARY: 129 mg/dL — AB (ref 70–99)
GLUCOSE-CAPILLARY: 86 mg/dL (ref 70–99)

## 2014-01-10 LAB — IRON AND TIBC
IRON: 16 ug/dL — AB (ref 42–135)
SATURATION RATIOS: 7 % — AB (ref 20–55)
TIBC: 220 ug/dL (ref 215–435)
UIBC: 204 ug/dL (ref 125–400)

## 2014-01-10 LAB — RETICULOCYTES
RBC.: 3.56 MIL/uL — ABNORMAL LOW (ref 4.22–5.81)
Retic Count, Absolute: 78.3 10*3/uL (ref 19.0–186.0)
Retic Ct Pct: 2.2 % (ref 0.4–3.1)

## 2014-01-10 LAB — MRSA PCR SCREENING: MRSA BY PCR: NEGATIVE

## 2014-01-10 LAB — VITAMIN B12: VITAMIN B 12: 684 pg/mL (ref 211–911)

## 2014-01-10 LAB — HEMOGLOBIN A1C
HEMOGLOBIN A1C: 7.6 % — AB (ref <5.7)
MEAN PLASMA GLUCOSE: 171 mg/dL — AB (ref <117)

## 2014-01-10 LAB — TSH: TSH: 5.927 u[IU]/mL — AB (ref 0.350–4.500)

## 2014-01-10 LAB — FERRITIN: Ferritin: 131 ng/mL (ref 22–322)

## 2014-01-10 MED ORDER — ETRAVIRINE 100 MG PO TABS
200.0000 mg | ORAL_TABLET | Freq: Two times a day (BID) | ORAL | Status: DC
  Administered 2014-01-10 – 2014-01-14 (×9): 200 mg via ORAL
  Filled 2014-01-10 (×10): qty 2

## 2014-01-10 NOTE — Progress Notes (Addendum)
[facility] PROGRESS NOTE Interim History: 79 yo male, resident of [facility], with multiple and very complex medical problems including systolic CHF per last 2 D ECHO 06/2012 with EF 45%, atrial fibrillation on chronic Coumadin, diabetes mellitus requiring insulin, HTN, HIV on HAART, who was transferred to [facility] ED from [facility] by EMS due to concern of abnormal CXR obtained at the facility 01/08/2013. Please note that pt is unable to provide much history and no family at bedside. Most of the information obtained from available records and ED physician.   Filed Weights   01/09/14 1900 01/09/14 2005 01/10/14 0420  Weight: 74 kg (163 lb 2.3 oz) 69.3 kg (152 lb 12.5 oz) 67.3 kg (148 lb 5.9 oz)        Intake/Output Summary (Last 24 hours) at 01/10/14 0951 Last data filed at 01/10/14 0500  Gross per 24 hour  Intake    350 ml  Output   1200 ml  Net   -850 ml     Assessment/Plan: Acute on chronic combined systolic and diastolic congestive heart failure - Weight has improved from 163->148. Cr is stable( baseline cr around 1.7-1.9). - Still has JVD, poor urine output. Cont lasix, and fluid restriction. - I agree with cardiology that he is fluid overloaded. Echo is pending. - Sodium cont to be high. Check b-met in am.  Hypernatremia: - due to heart failure, cont diuresis. - monitor urine output check a b-met in am.  Acute Thrombocytopenia/  Anemia: - MCV 80, RDW 23.9. He is on antiretroviral medications. - No sign of bleeding. Hbg at baseline. - Check RBC folate, b12 and ferritin.  Altered mental state - Multifactorial due to hyponatremia and acute illness, unclear baseline will have to contact family. - have tried to contact family and friend with no success.  Long term (current) use of anticoagulants - INR sub-therapeutic  Hypothyroidism - mild high, repeat in 6 weeks.  HIV disease - cont meds. - continue HAART: Prezista, Intelence,  Ritonavir, Epivir   Chronic kidney disease stage 4 -  Close to basleine (1.7-1.9).  Type 2 diabetes mellitus with vascular disease - moderate control.  Code Status: Full  Family Communication: no family at bedside  Disposition Plan: Admit to telemetry bed     Consultants:  cardiology  Procedures: ECHO: pending  Antibiotics: none  Prezista, Intelence, Ritonavir, Epivir   HPI/Subjective: Still confused.  Objective: Filed Vitals:   01/09/14 1915 01/09/14 1959 01/09/14 2005 01/10/14 0420  BP: 150/86  128/64 139/55  Pulse: 66  72 73  Temp:   97.7 F (36.5 C) 98.3 F (36.8 C)  TempSrc:   Oral Axillary  Resp:   18 18  Height:   6' (1.829 m)   Weight:   69.3 kg (152 lb 12.5 oz) 67.3 kg (148 lb 5.9 oz)  SpO2: 97% 99% 99% 98%     Exam:  General: in no acute distress.  HEENT: No bruits, no goiter. + JVD Heart: Regular rate and rhythm, without murmurs, rubs, gallops.  Lungs: Good air movement, crackles at bases. Abdomen: Soft, nontender, nondistended, positive bowel sounds.     Data Reviewed: Basic Metabolic Panel:  Recent Labs Lab 01/09/14 1407 01/09/14 2040 01/10/14 0400  NA 150*  --  151*  K 4.1  --  4.1  CL 112  --  114*  CO2 26  --  25  GLUCOSE 157*  --  87  BUN 44*  --  43*  CREATININE  1.98*  --  1.93*  CALCIUM 8.7  --  9.0  MG  --  2.2  --   PHOS  --  2.7  --    Liver Function Tests:  Recent Labs Lab 01/09/14 1407  AST 20  ALT 10  ALKPHOS 283*  BILITOT 1.4*  PROT 6.3  ALBUMIN 2.3*   No results found for this basename: LIPASE, AMYLASE,  in the last 168 hours No results found for this basename: AMMONIA,  in the last 168 hours CBC:  Recent Labs Lab 01/09/14 1407 01/10/14 0400  WBC 6.4 7.7  NEUTROABS 3.2  --   HGB 9.0* 9.0*  HCT 31.1* 30.8*  MCV 81.6 80.0  PLT 96* 101*   Cardiac Enzymes:  Recent Labs Lab 01/09/14 1424  TROPONINI <0.30   BNP (last 3 results)  Recent Labs  01/09/14 1424 01/09/14 2040  PROBNP  41214.0* 38688.0*   CBG:  Recent Labs Lab 01/09/14 2227 01/10/14 0541  GLUCAP 126* 76    Recent Results (from the past 240 hour(s))  MRSA PCR SCREENING     Status: None   Collection Time    01/09/14 10:15 PM      Result Value Range Status   MRSA by PCR NEGATIVE  NEGATIVE Final   Comment:            The GeneXpert MRSA Assay (FDA     approved for NASAL specimens     only), is one component of a     comprehensive MRSA colonization     surveillance program. It is not     intended to diagnose MRSA     infection nor to guide or     monitor treatment for     MRSA infections.     Studies: Dg Chest 2 View  01/09/2014   CLINICAL DATA:  Cough, congestion, weakness  EXAM: CHEST  2 VIEW  COMPARISON:  08/11/2013  FINDINGS: There is moderate enlargement of cardiac silhouette. Cardiac pacer is in unchanged position. There is moderate vascular congestion. There is peribronchial wall thickening. There is mild diffuse interstitial prominence. There are small bilateral pleural effusions.  IMPRESSION: Congestive heart failure with moderately severe pulmonary edema   Electronically Signed   By: Esperanza Heir M.D.   On: 01/09/2014 14:58    Scheduled Meds: . allopurinol  250 mg Oral Daily  . aspirin EC  81 mg Oral Daily  . atorvastatin  10 mg Oral QHS  . carvedilol  12.5 mg Oral BID WC  . darunavir  600 mg Oral BID WC  . enoxaparin (LOVENOX) injection  1 mg/kg Subcutaneous QHS  . etravirine  200 mg Oral BID WC  . ferrous sulfate  325 mg Oral BID WC  . furosemide  40 mg Intravenous Daily  . hydrALAZINE  10 mg Oral Q8H  . insulin aspart  0-9 Units Subcutaneous TID WC  . insulin NPH Human  5 Units Subcutaneous QHS  . levothyroxine  75 mcg Oral QAC breakfast  . pantoprazole  40 mg Oral Daily  . predniSONE  7.5 mg Oral Q breakfast  . ritonavir  100 mg Oral BID WC  . senna-docusate  2 tablet Oral BID  . sodium chloride  3 mL Intravenous Q12H  . sodium chloride  3 mL Intravenous Q12H  .  sulfamethoxazole-trimethoprim  1 tablet Oral Q M,W,F   Continuous Infusions:    Marinda Elk  [facility] Pager [phone] If 8PM-8AM, please contact night-coverage at [email or web address], password [facility]  01/10/2014, 9:51 AM  LOS: 1 day

## 2014-01-10 NOTE — Evaluation (Signed)
Physical Therapy Evaluation/ DIscharge Patient Details Name: [name] Proctor MRN: [id number] DOB: November 06, 1935 Today's Date: 01/10/2014 Time: 9563-8756 PT Time Calculation (min): 14 min  PT Assessment / Plan / Recommendation History of Present Illness  Pt is 79 yo male, resident of [facility] center, with multiple and very complex medical problems including systolic CHF per last 2 D ECHO 06/2012 with EF 45%, atrial fibrillation on chronic Coumadin, diabetes mellitus requiring insulin, HTN, HIV on HAART, who was transferred to [facility] ED from [facility] by EMS due to concern of abnormal CXR obtained at the facility 01/08/2013  Clinical Impression  Pt from [facility] SNF with progressive decline in function over the last 4 months. Pt was at home prior to fall and hip surgery and today is unable to communicate, follow commands or assist with mobility. Pt with preference for left sidelying in fetal position with left neck rotation and flexion. Able to achieve midline head with PROM and propped head in midline with pillow and blanket roll end of session. Also able to achieve bil knee extension passively and positioned with heel roll end of session to prevent contracture as well as pt shifted to right semisidely for pressure relief. Pt not appropriate at this time for acute services due to deficits and cognition and will defer further therapy back to SNF. Recommend daily ROM and positioning with nursing acutely. Signing off.     PT Assessment  All further PT needs can be met in the next venue of care    Follow Up Recommendations  SNF    Does the patient have the potential to tolerate intense rehabilitation      Barriers to Discharge        Equipment Recommendations  None recommended by PT    Recommendations for Other Services     Frequency      Precautions / Restrictions Precautions Precautions: Fall   Pertinent Vitals/Pain Pt without signs of pain VSS       Mobility  Bed Mobility Overal bed mobility: Needs Assistance;+2 for physical assistance Bed Mobility: Supine to Sit;Sit to Supine;Rolling Rolling: Total assist Supine to sit: Total assist Sit to supine: Total assist General bed mobility comments: +2 to change pads under pt and scoot him to [facility]. Pt left sidelying in fetal position on arrival. Able to tranfer sit<> sidely with total assist. Pt not assisting with any transfers. However, once EOB pt able to prop on Left elbow to maintain sitting balance 2 min    Exercises     PT Diagnosis: Generalized weakness;Altered mental status  PT Problem List: Decreased strength;Decreased cognition;Decreased range of motion;Decreased activity tolerance;Decreased mobility;Decreased balance;Decreased skin integrity PT Treatment Interventions:       PT Goals(Current goals can be found in the care plan section) Acute Rehab PT Goals PT Goal Formulation: No goals set, d/c therapy  Visit Information  Last PT Received On: 01/10/14 Assistance Needed: +2 History of Present Illness: Pt is 79 yo male, resident of [facility] center, with multiple and very complex medical problems including systolic CHF per last 2 D ECHO 06/2012 with EF 45%, atrial fibrillation on chronic Coumadin, diabetes mellitus requiring insulin, HTN, HIV on HAART, who was transferred to [facility] ED from [facility] by EMS due to concern of abnormal CXR obtained at the facility 01/08/2013       Prior [place] expects to be discharged to:: Skilled nursing facility Prior Function Level of Independence: Needs  assistance Comments: pt was independent with AD until 4 mo ago when he fell and had a THA. since that time pt at [facility] with continued weakness. Pt unable to provide any history today and based on pt presentation presume total assist.    Cognition  Cognition Arousal/Alertness: Lethargic Behavior During Therapy: Flat  affect Overall Cognitive Status: Difficult to assess Difficult to assess due to: Level of arousal    Extremity/Trunk Assessment Upper Extremity Assessment Upper Extremity Assessment: Generalized weakness Lower Extremity Assessment Lower Extremity Assessment: Generalized weakness Cervical / Trunk Assessment Cervical / Trunk Assessment: Kyphotic   Balance Balance Overall balance assessment: History of Falls;Needs assistance Sitting balance-Leahy Scale: Poor Sitting balance - Comments: 2 Postural control: Left lateral lean  End of Session PT - End of Session Activity Tolerance: Patient limited by lethargy Patient left: in bed;with call bell/phone within reach;with bed alarm set Nurse Communication: Mobility status  GP     Lanetta Inch Beth 01/10/2014, 10:00 AM Elwyn Reach, [place]

## 2014-01-10 NOTE — Progress Notes (Signed)
         PROGRESS NOTE  DATE: 01/08/2014  FACILITY:  [facility]  LEVEL OF CARE: SNF (31)  Acute Visit  CHIEF COMPLAINT:  Manage weakness, cough and diabetes mellitus  HISTORY OF PRESENT ILLNESS: I was requested by the staff to assess the patient regarding above problem(s):  COUGH: Problem. Staff reports a new onset cough for several days. Cough is (non)productive. Precipitating or alleviating factors cannot be identified. There are no other associated signs and symptoms such as shortness of breath, fever, chills or night sweats. There is no respiratory insufficiency. There is no temporal relationship.  WEAKNESS: Family is concerned that patient is much weaker since admission, po intake is sometimes very poor& he also had loose stools multiple times. Patient is a poor historian. Staff cannot identify precipitating or alleviating factors. There is no temporal relationship.  DM:pt's DM remains stable.  Staff denies polyuria, polydipsia, polyphagia, changes in vision or hypoglycemic episodes.  No complications noted from the medication presently being used.  Last hemoglobin A1c is: Not available. I was requested by the staff to assess the patient's CBGs due to poor by mouth intake at sometimes.  PAST MEDICAL HISTORY : Reviewed.  No changes.  [facility]  REVIEW OF SYSTEMS: Unobtainable due to patient being a poor historian  PHYSICAL EXAMINATION  VS:  T 98.2        P 78      RR 20       BP 140/80      POX % 95         GENERAL: no acute distress, normal body habitus EYES: conjunctivae normal, sclerae normal, normal eye lids NECK: supple, trachea midline, no neck masses, no thyroid tenderness, no thyromegaly LYMPHATICS: no LAN in the neck, no supraclavicular LAN RESPIRATORY: breathing is even & unlabored, BS decreased CARDIAC: RRR, no murmur,no extra heart sounds, no edema GI: abdomen soft, normal BS, no masses, no tenderness, no hepatomegaly,  no splenomegaly PSYCHIATRIC: the patient is alert & disoriened , affect depressed, & behavior appropriate   ASSESSMENT/PLAN:  Spero CurbWeakness-new onset significant problem. Etiology unclear. Check CMP and TSH. Cough-new problem. Check CBC with differential and check chest x-ray. Diarrhea-new problem. Check C. Diff toxin x3. Diabetes mellitus-CBGs are stable at this point. No need to decrease medications. Check hemoglobin A1c.  CPT CODE: [id number]

## 2014-01-10 NOTE — Progress Notes (Signed)
  Echocardiogram 2D Echocardiogram has been performed.  [name] Proctor, [name] Proctor 01/10/2014, 3:14 PM

## 2014-01-10 NOTE — Progress Notes (Signed)
Noted to have difficulty swallowing.  Hospitalist on  Call made aware of need for swallow evaluation.  Provider on call aware, call back appreciated, advised that concern will be passed along to day shift.

## 2014-01-10 NOTE — Progress Notes (Addendum)
Turned q2h, client ate some for lunch when fed.  Client pockets food and has to monitored closely when fed.  ECHO completed.  Med given later than scheduled to allow client to tolerate them with coughing them back up.  Appetite poor.

## 2014-01-11 ENCOUNTER — Inpatient Hospital Stay (HOSPITAL_COMMUNITY): Payer: Medicare Other

## 2014-01-11 LAB — BASIC METABOLIC PANEL
BUN: 42 mg/dL — ABNORMAL HIGH (ref 6–23)
CALCIUM: 8.9 mg/dL (ref 8.4–10.5)
CO2: 26 mEq/L (ref 19–32)
Chloride: 111 mEq/L (ref 96–112)
Creatinine, Ser: 2.16 mg/dL — ABNORMAL HIGH (ref 0.50–1.35)
GFR, EST AFRICAN AMERICAN: 32 mL/min — AB (ref >90)
GFR, EST NON AFRICAN AMERICAN: 28 mL/min — AB (ref >90)
Glucose, Bld: 87 mg/dL (ref 70–99)
Potassium: 3.8 mEq/L (ref 3.7–5.3)
SODIUM: 153 meq/L — AB (ref 137–147)

## 2014-01-11 LAB — GLUCOSE, CAPILLARY
GLUCOSE-CAPILLARY: 123 mg/dL — AB (ref 70–99)
GLUCOSE-CAPILLARY: 96 mg/dL (ref 70–99)
Glucose-Capillary: 88 mg/dL (ref 70–99)
Glucose-Capillary: 169 mg/dL — ABNORMAL HIGH (ref 70–99)
Glucose-Capillary: 121 mg/dL — ABNORMAL HIGH (ref 70–99)

## 2014-01-11 LAB — PROTIME-INR
INR: 1.8 — AB (ref 0.00–1.49)
PROTHROMBIN TIME: 20.4 s — AB (ref 11.6–15.2)

## 2014-01-11 MED ORDER — WARFARIN SODIUM 2.5 MG PO TABS
2.5000 mg | ORAL_TABLET | Freq: Once | ORAL | Status: AC
  Administered 2014-01-11: 2.5 mg via ORAL
  Filled 2014-01-11: qty 1

## 2014-01-11 MED ORDER — WARFARIN - PHARMACIST DOSING INPATIENT: Freq: Every day | Status: DC

## 2014-01-11 NOTE — Progress Notes (Signed)
Palliative Medicine consult received. We are pleased to assist in the care of this patient and will schedule a goals of care meeting at the earliest possible time we have a provider available.   Elizabeth Golding, DO Palliative Medicine  

## 2014-01-11 NOTE — Progress Notes (Addendum)
More alert today and talkative.  Reposition.  Scabbed area right shoulder and lateral side with some bruising.  Small skin shearing right buttock, barrier cream applied.

## 2014-01-11 NOTE — Progress Notes (Signed)
SUBJECTIVE:  Awake and answering questions.  Denies pain or SOB   PHYSICAL EXAM Filed Vitals:   01/10/14 1951 01/10/14 2352 01/11/14 0458 01/11/14 0921  BP: 117/54 115/60 125/52 106/44  Pulse: 68  79 72  Temp: 97.9 F (36.6 C)  99.4 F (37.4 C) 97.7 F (36.5 C)  TempSrc: Oral  Oral Oral  Resp: 18  18 18   Height:      Weight:   146 lb 6.2 oz (66.4 kg)   SpO2: 95%  98% 94%   General:  Cachectic. Lungs:  Decreased breath sounds with crackles Heart:  Distant heart sounds Abdomen:  Positive bowel sounds, no rebound no guarding Extremities:  Wasted muscles  LABS:  Results for orders placed during the hospital encounter of 01/09/14 (from the past 24 hour(s))  GLUCOSE, CAPILLARY     Status: Abnormal   Collection Time    01/10/14 11:04 AM      Result Value Range   Glucose-Capillary 129 (*) 70 - 99 mg/dL   Comment 1 Documented in Chart     Comment 2 Notify RN    VITAMIN B12     Status: None   Collection Time    01/10/14 11:59 AM      Result Value Range   Vitamin B-12 684  211 - 911 pg/mL  IRON AND TIBC     Status: Abnormal   Collection Time    01/10/14 11:59 AM      Result Value Range   Iron 16 (*) 42 - 135 ug/dL   TIBC 161220  096215 - 045435 ug/dL   Saturation Ratios 7 (*) 20 - 55 %   UIBC 204  125 - 400 ug/dL  FERRITIN     Status: None   Collection Time    01/10/14 11:59 AM      Result Value Range   Ferritin 131  22 - 322 ng/mL  RETICULOCYTES     Status: Abnormal   Collection Time    01/10/14 11:59 AM      Result Value Range   Retic Ct Pct 2.2  0.4 - 3.1 %   RBC. 3.56 (*) 4.22 - 5.81 MIL/uL   Retic Count, Manual 78.3  19.0 - 186.0 K/uL  GLUCOSE, CAPILLARY     Status: Abnormal   Collection Time    01/10/14  4:49 PM      Result Value Range   Glucose-Capillary 123 (*) 70 - 99 mg/dL   Comment 1 Documented in Chart     Comment 2 Notify RN    GLUCOSE, CAPILLARY     Status: None   Collection Time    01/10/14  9:40 PM      Result Value Range   Glucose-Capillary 86   70 - 99 mg/dL  PROTIME-INR     Status: Abnormal   Collection Time    01/11/14  5:58 AM      Result Value Range   Prothrombin Time 20.4 (*) 11.6 - 15.2 seconds   INR 1.80 (*) 0.00 - 1.49  BASIC METABOLIC PANEL     Status: Abnormal   Collection Time    01/11/14  5:58 AM      Result Value Range   Sodium 153 (*) 137 - 147 mEq/L   Potassium 3.8  3.7 - 5.3 mEq/L   Chloride 111  96 - 112 mEq/L   CO2 26  19 - 32 mEq/L   Glucose, Bld 87  70 - 99 mg/dL   BUN  42 (*) 6 - 23 mg/dL   Creatinine, Ser 6.96 (*) 0.50 - 1.35 mg/dL   Calcium 8.9  8.4 - 29.5 mg/dL   GFR calc non Af Amer 28 (*) >90 mL/min   GFR calc Af Amer 32 (*) >90 mL/min  GLUCOSE, CAPILLARY     Status: None   Collection Time    01/11/14  5:59 AM      Result Value Range   Glucose-Capillary 88  70 - 99 mg/dL    Intake/Output Summary (Last 24 hours) at 01/11/14 0939 Last data filed at 01/11/14 0900  Gross per 24 hour  Intake    246 ml  Output   1500 ml  Net  -1254 ml    ASSESSMENT AND PLAN:  CKD:  Progressive renal insufficiency with diuresis.  Also, Na increased.  I would stop Lasix.  Our options here are very limited.  Code status needs to be addressed.    CHRONIC SYSTOLIC HF:   EF is slightly lower than previous.  Mild mod MR and AI.  I do not think that treatment with inotropes would help this situation long term.     Rollene Rotunda 01/11/2014 9:39 AM

## 2014-01-11 NOTE — ED Provider Notes (Addendum)
CSN: [id number]     Arrival date & time 01/09/14  1357 History   First MD Initiated Contact with Patient 01/09/14 1358     Chief Complaint  Patient presents with  . Cough   (Consider location/radiation/quality/duration/timing/severity/associated sxs/prior Treatment) Patient is a 79 y.o. male presenting with cough. The history is provided by the patient, the EMS personnel and the nursing home. The history is limited by the condition of the patient.  Cough  level V caveat applies due to the patient says confusion.  Patient sent in from [facility]. Brought in by EMS they were called out for an abnormal chest x-ray that was taken yesterday but the results came in today. Nursing home stay the patient has had a persistent wet cough. Patient has a history of congestive heart failure. Patient is also positive HIV.  Past Medical History  Diagnosis Date  . CHF (congestive heart failure)     a. h/o diastolic dysfunction. b. EF 45% by echo 06/2012.  Marland Kitchen History of diastolic dysfunction   . SSS (sick sinus syndrome)   . Coronary artery disease     s/p CABG 2010  . History of atrial flutter     s/p ablation  . Chronic renal insufficiency   . Diabetes mellitus type 2, insulin dependent   . History of GI bleed   . Gastritis   . Esophagitis   . Duodenal ulcer   . IDA (iron deficiency anemia)   . Hypertension   . Hyperlipidemia   . Hypothyroidism   . HIV positive   . RAS (renal artery stenosis)   . pacemaker-MDT     Medtronic EnRhythm generator serial [id number] H 2007 Dr. Reyes Ivan. Replaced at [facility] 2012     . Atrial fibrillation     By pacemaker interrogation 06/2012 - device reprogrammed    Past Surgical History  Procedure Laterality Date  . Cardiac catheterization  02/22/2009    EF 40-45%  . Coronary angioplasty  03/06/2006    INTRACORONARY STENTING OF SAPHENOUS VEIN GRAFT TO THE PDA AND INTRACORONARY STENTING OF THE SAPHENOUS VEIN GRAFT TO THE DIAGONAL  . Insert  / replace / remove pacemaker      IMPLANT  . Ablation saphenous vein w/ rfa    . Renal artery stent      LEFT  . Coronary artery bypass graft  02/2009    REDO. NEW SAPHENOUS VEIN GRAFT TO THE OBTUSE MARGINAL VESSEL AND A SEQUENTIAL SAPHENOUS VEIN GRAFT TO THE PDA AND POSTERIOR LATERAL BRANCHES OF THE RIGHT CORONARY. THE LIMA GRAFT FROM HIS ORIGINAL SURGERY WAS STILL PATENT.  Marland Kitchen Pacemaker generator change  3/12    done at [facility]  . Total hip arthroplasty Right 08/13/2013    Procedure: Right Hip Hemiarthroplasty;  Surgeon: Sheral Apley, MD;  Location: [facility] OR;  Service: Orthopedics;  Laterality: Right;   Family History  Problem Relation Age of Onset  . Kidney disease Mother   . Hypertension Father   . Crohn's disease Daughter   . Lupus Sister    History  Substance Use Topics  . Smoking status: Never Smoker   . Smokeless tobacco: Never Used  . Alcohol Use: No    Review of Systems  Unable to perform ROS Respiratory: Positive for cough.    level V caveat applies due to to altered mental status. Patient is awake but appears confused.  Allergies  Darvocet; Seldane; Sulfonylureas; and Ultracet  Home Medications  No current outpatient prescriptions on file.  BP 116/57  Pulse 68  Temp(Src) 98 F (36.7 C) (Oral)  Resp 18  Ht 6' (1.829 m)  Wt 146 lb 6.2 oz (66.4 kg)  BMI 19.85 kg/m2  SpO2 98% Physical Exam  Nursing note and vitals reviewed. Constitutional: He appears well-developed and well-nourished. No distress.  HENT:  Head: Normocephalic and atraumatic.  Mucous membranes are dry  Eyes: Conjunctivae and EOM are normal. Pupils are equal, round, and reactive to light.  Neck: Normal range of motion.  Cardiovascular: Normal rate and regular rhythm.   Pulmonary/Chest: Effort normal. He has rales.  Very wet sounding cough  Abdominal: Bowel sounds are normal. There is no tenderness.  Musculoskeletal: Normal range of motion. He exhibits no edema.  Neurological: He is alert. No  cranial nerve deficit. He exhibits normal muscle tone. Coordination normal.  Skin: Skin is warm. No rash noted.    ED Course  Procedures (including critical care time) Labs Review Labs Reviewed  CBC WITH DIFFERENTIAL - Abnormal; Notable for the following:    RBC 3.81 (*)    Hemoglobin 9.0 (*)    HCT 31.1 (*)    MCH 23.6 (*)    MCHC 28.9 (*)    RDW 23.5 (*)    Platelets 96 (*)    All other components within normal limits  COMPREHENSIVE METABOLIC PANEL - Abnormal; Notable for the following:    Sodium 150 (*)    Glucose, Bld 157 (*)    BUN 44 (*)    Creatinine, Ser 1.98 (*)    Albumin 2.3 (*)    Alkaline Phosphatase 283 (*)    Total Bilirubin 1.4 (*)    GFR calc non Af Amer 31 (*)    GFR calc Af Amer 36 (*)    All other components within normal limits  PRO B NATRIURETIC PEPTIDE - Abnormal; Notable for the following:    Pro B Natriuretic peptide (BNP) 41214.0 (*)    All other components within normal limits  URINALYSIS, ROUTINE W REFLEX MICROSCOPIC - Abnormal; Notable for the following:    Leukocytes, UA TRACE (*)    All other components within normal limits  URINE MICROSCOPIC-ADD ON - Abnormal; Notable for the following:    Casts HYALINE CASTS (*)    All other components within normal limits  PROTIME-INR - Abnormal; Notable for the following:    Prothrombin Time 21.1 (*)    INR 1.89 (*)    All other components within normal limits  PROTIME-INR - Abnormal; Notable for the following:    Prothrombin Time 20.4 (*)    INR 1.80 (*)    All other components within normal limits  TSH - Abnormal; Notable for the following:    TSH 5.927 (*)    All other components within normal limits  PRO B NATRIURETIC PEPTIDE - Abnormal; Notable for the following:    Pro B Natriuretic peptide (BNP) 16109.638688.0 (*)    All other components within normal limits  URINALYSIS, ROUTINE W REFLEX MICROSCOPIC - Abnormal; Notable for the following:    Hgb urine dipstick TRACE (*)    Leukocytes, UA SMALL (*)     All other components within normal limits  BASIC METABOLIC PANEL - Abnormal; Notable for the following:    Sodium 151 (*)    Chloride 114 (*)    BUN 43 (*)    Creatinine, Ser 1.93 (*)    GFR calc non Af Amer 32 (*)    GFR calc Af Amer 37 (*)  All other components within normal limits  CBC - Abnormal; Notable for the following:    RBC 3.85 (*)    Hemoglobin 9.0 (*)    HCT 30.8 (*)    MCH 23.4 (*)    MCHC 29.2 (*)    RDW 23.9 (*)    Platelets 101 (*)    All other components within normal limits  HEMOGLOBIN A1C - Abnormal; Notable for the following:    Hemoglobin A1C 7.6 (*)    Mean Plasma Glucose 171 (*)    All other components within normal limits  GLUCOSE, CAPILLARY - Abnormal; Notable for the following:    Glucose-Capillary 126 (*)    All other components within normal limits  IRON AND TIBC - Abnormal; Notable for the following:    Iron 16 (*)    Saturation Ratios 7 (*)    All other components within normal limits  RETICULOCYTES - Abnormal; Notable for the following:    RBC. 3.56 (*)    All other components within normal limits  GLUCOSE, CAPILLARY - Abnormal; Notable for the following:    Glucose-Capillary 129 (*)    All other components within normal limits  PROTIME-INR - Abnormal; Notable for the following:    Prothrombin Time 20.4 (*)    INR 1.80 (*)    All other components within normal limits  BASIC METABOLIC PANEL - Abnormal; Notable for the following:    Sodium 153 (*)    BUN 42 (*)    Creatinine, Ser 2.16 (*)    GFR calc non Af Amer 28 (*)    GFR calc Af Amer 32 (*)    All other components within normal limits  GLUCOSE, CAPILLARY - Abnormal; Notable for the following:    Glucose-Capillary 123 (*)    All other components within normal limits  GLUCOSE, CAPILLARY - Abnormal; Notable for the following:    Glucose-Capillary 169 (*)    All other components within normal limits  GLUCOSE, CAPILLARY - Abnormal; Notable for the following:    Glucose-Capillary  121 (*)    All other components within normal limits  URINE CULTURE  MRSA PCR SCREENING  TROPONIN I  MAGNESIUM  PHOSPHORUS  URINE MICROSCOPIC-ADD ON  GLUCOSE, CAPILLARY  VITAMIN B12  FERRITIN  GLUCOSE, CAPILLARY  GLUCOSE, CAPILLARY  T-HELPER CELLS (CD4) COUNT  FOLATE RBC  PROTIME-INR  CBC  BASIC METABOLIC PANEL   Results for orders placed during the hospital encounter of 01/09/14  URINE CULTURE      Result Value Range   Specimen Description URINE, CATHETERIZED     Special Requests NONE     Culture  Setup Time       Value: 01/10/2014 11:35     Performed at [facility] Count PENDING     Culture       Value: Culture reincubated for better growth     Performed at [facility]   Report Status PENDING    MRSA PCR SCREENING      Result Value Range   MRSA by PCR NEGATIVE  NEGATIVE  CBC WITH DIFFERENTIAL      Result Value Range   WBC 6.4  4.0 - 10.5 K/uL   RBC 3.81 (*) 4.22 - 5.81 MIL/uL   Hemoglobin 9.0 (*) 13.0 - 17.0 g/dL   HCT 92.1 (*) 19.4 - 17.4 %   MCV 81.6  78.0 - 100.0 fL   MCH 23.6 (*) 26.0 - 34.0 pg   MCHC 28.9 (*)  30.0 - 36.0 g/dL   RDW 40.9 (*) 81.1 - 91.4 %   Platelets 96 (*) 150 - 400 K/uL   Neutrophils Relative % 50  43 - 77 %   Lymphocytes Relative 38  12 - 46 %   Monocytes Relative 11  3 - 12 %   Eosinophils Relative 1  0 - 5 %   Basophils Relative 0  0 - 1 %   Neutro Abs 3.2  1.7 - 7.7 K/uL   Lymphs Abs 2.4  0.7 - 4.0 K/uL   Monocytes Absolute 0.7  0.1 - 1.0 K/uL   Eosinophils Absolute 0.1  0.0 - 0.7 K/uL   Basophils Absolute 0.0  0.0 - 0.1 K/uL   RBC Morphology POLYCHROMASIA PRESENT     WBC Morphology TOXIC GRANULATION    COMPREHENSIVE METABOLIC PANEL      Result Value Range   Sodium 150 (*) 137 - 147 mEq/L   Potassium 4.1  3.7 - 5.3 mEq/L   Chloride 112  96 - 112 mEq/L   CO2 26  19 - 32 mEq/L   Glucose, Bld 157 (*) 70 - 99 mg/dL   BUN 44 (*) 6 - 23 mg/dL   Creatinine, Ser 7.82 (*) 0.50 - 1.35 mg/dL   Calcium 8.7   8.4 - 95.6 mg/dL   Total Protein 6.3  6.0 - 8.3 g/dL   Albumin 2.3 (*) 3.5 - 5.2 g/dL   AST 20  0 - 37 U/L   ALT 10  0 - 53 U/L   Alkaline Phosphatase 283 (*) 39 - 117 U/L   Total Bilirubin 1.4 (*) 0.3 - 1.2 mg/dL   GFR calc non Af Amer 31 (*) >90 mL/min   GFR calc Af Amer 36 (*) >90 mL/min  PRO B NATRIURETIC PEPTIDE      Result Value Range   Pro B Natriuretic peptide (BNP) 41214.0 (*) 0 - 450 pg/mL  TROPONIN I      Result Value Range   Troponin I <0.30  <0.30 ng/mL  URINALYSIS, ROUTINE W REFLEX MICROSCOPIC      Result Value Range   Color, Urine YELLOW  YELLOW   APPearance CLEAR  CLEAR   Specific Gravity, Urine 1.015  1.005 - 1.030   pH 5.5  5.0 - 8.0   Glucose, UA NEGATIVE  NEGATIVE mg/dL   Hgb urine dipstick NEGATIVE  NEGATIVE   Bilirubin Urine NEGATIVE  NEGATIVE   Ketones, ur NEGATIVE  NEGATIVE mg/dL   Protein, ur NEGATIVE  NEGATIVE mg/dL   Urobilinogen, UA 1.0  0.0 - 1.0 mg/dL   Nitrite NEGATIVE  NEGATIVE   Leukocytes, UA TRACE (*) NEGATIVE  URINE MICROSCOPIC-ADD ON      Result Value Range   Squamous Epithelial / LPF RARE  RARE   WBC, UA 3-6  <3 WBC/hpf   Bacteria, UA RARE  RARE   Casts HYALINE CASTS (*) NEGATIVE   Urine-Other MUCOUS PRESENT    PROTIME-INR      Result Value Range   Prothrombin Time 21.1 (*) 11.6 - 15.2 seconds   INR 1.89 (*) 0.00 - 1.49  PROTIME-INR      Result Value Range   Prothrombin Time 20.4 (*) 11.6 - 15.2 seconds   INR 1.80 (*) 0.00 - 1.49  MAGNESIUM      Result Value Range   Magnesium 2.2  1.5 - 2.5 mg/dL  PHOSPHORUS      Result Value Range   Phosphorus 2.7  2.3 - 4.6 mg/dL  TSH      Result Value Range   TSH 5.927 (*) 0.350 - 4.500 uIU/mL  PRO B NATRIURETIC PEPTIDE      Result Value Range   Pro B Natriuretic peptide (BNP) 38688.0 (*) 0 - 450 pg/mL  URINALYSIS, ROUTINE W REFLEX MICROSCOPIC      Result Value Range   Color, Urine YELLOW  YELLOW   APPearance CLEAR  CLEAR   Specific Gravity, Urine 1.008  1.005 - 1.030   pH 5.5  5.0  - 8.0   Glucose, UA NEGATIVE  NEGATIVE mg/dL   Hgb urine dipstick TRACE (*) NEGATIVE   Bilirubin Urine NEGATIVE  NEGATIVE   Ketones, ur NEGATIVE  NEGATIVE mg/dL   Protein, ur NEGATIVE  NEGATIVE mg/dL   Urobilinogen, UA 0.2  0.0 - 1.0 mg/dL   Nitrite NEGATIVE  NEGATIVE   Leukocytes, UA SMALL (*) NEGATIVE  BASIC METABOLIC PANEL      Result Value Range   Sodium 151 (*) 137 - 147 mEq/L   Potassium 4.1  3.7 - 5.3 mEq/L   Chloride 114 (*) 96 - 112 mEq/L   CO2 25  19 - 32 mEq/L   Glucose, Bld 87  70 - 99 mg/dL   BUN 43 (*) 6 - 23 mg/dL   Creatinine, Ser 1.61 (*) 0.50 - 1.35 mg/dL   Calcium 9.0  8.4 - 09.6 mg/dL   GFR calc non Af Amer 32 (*) >90 mL/min   GFR calc Af Amer 37 (*) >90 mL/min  CBC      Result Value Range   WBC 7.7  4.0 - 10.5 K/uL   RBC 3.85 (*) 4.22 - 5.81 MIL/uL   Hemoglobin 9.0 (*) 13.0 - 17.0 g/dL   HCT 04.5 (*) 40.9 - 81.1 %   MCV 80.0  78.0 - 100.0 fL   MCH 23.4 (*) 26.0 - 34.0 pg   MCHC 29.2 (*) 30.0 - 36.0 g/dL   RDW 91.4 (*) 78.2 - 95.6 %   Platelets 101 (*) 150 - 400 K/uL  HEMOGLOBIN A1C      Result Value Range   Hemoglobin A1C 7.6 (*) <5.7 %   Mean Plasma Glucose 171 (*) <117 mg/dL  GLUCOSE, CAPILLARY      Result Value Range   Glucose-Capillary 126 (*) 70 - 99 mg/dL  URINE MICROSCOPIC-ADD ON      Result Value Range   Squamous Epithelial / LPF RARE  RARE   WBC, UA 3-6  <3 WBC/hpf   RBC / HPF 3-6  <3 RBC/hpf   Bacteria, UA RARE  RARE  GLUCOSE, CAPILLARY      Result Value Range   Glucose-Capillary 76  70 - 99 mg/dL  VITAMIN O13      Result Value Range   Vitamin B-12 684  211 - 911 pg/mL  IRON AND TIBC      Result Value Range   Iron 16 (*) 42 - 135 ug/dL   TIBC 086  578 - 469 ug/dL   Saturation Ratios 7 (*) 20 - 55 %   UIBC 204  125 - 400 ug/dL  FERRITIN      Result Value Range   Ferritin 131  22 - 322 ng/mL  RETICULOCYTES      Result Value Range   Retic Ct Pct 2.2  0.4 - 3.1 %   RBC. 3.56 (*) 4.22 - 5.81 MIL/uL   Retic Count, Manual 78.3   19.0 - 186.0 K/uL  GLUCOSE, CAPILLARY  Result Value Range   Glucose-Capillary 129 (*) 70 - 99 mg/dL   Comment 1 Documented in Chart     Comment 2 Notify RN    PROTIME-INR      Result Value Range   Prothrombin Time 20.4 (*) 11.6 - 15.2 seconds   INR 1.80 (*) 0.00 - 1.49  BASIC METABOLIC PANEL      Result Value Range   Sodium 153 (*) 137 - 147 mEq/L   Potassium 3.8  3.7 - 5.3 mEq/L   Chloride 111  96 - 112 mEq/L   CO2 26  19 - 32 mEq/L   Glucose, Bld 87  70 - 99 mg/dL   BUN 42 (*) 6 - 23 mg/dL   Creatinine, Ser 1.61 (*) 0.50 - 1.35 mg/dL   Calcium 8.9  8.4 - 09.6 mg/dL   GFR calc non Af Amer 28 (*) >90 mL/min   GFR calc Af Amer 32 (*) >90 mL/min  GLUCOSE, CAPILLARY      Result Value Range   Glucose-Capillary 86  70 - 99 mg/dL  GLUCOSE, CAPILLARY      Result Value Range   Glucose-Capillary 123 (*) 70 - 99 mg/dL   Comment 1 Documented in Chart     Comment 2 Notify RN    GLUCOSE, CAPILLARY      Result Value Range   Glucose-Capillary 88  70 - 99 mg/dL  GLUCOSE, CAPILLARY      Result Value Range   Glucose-Capillary 169 (*) 70 - 99 mg/dL   Comment 1 Documented in Chart     Comment 2 Notify RN    GLUCOSE, CAPILLARY      Result Value Range   Glucose-Capillary 121 (*) 70 - 99 mg/dL   Comment 1 Documented in Chart     Comment 2 Notify RN      Imaging Review Ct Head Wo Contrast  01/11/2014   CLINICAL DATA:  Altered mental status.  EXAM: CT HEAD WITHOUT CONTRAST  TECHNIQUE: Contiguous axial images were obtained from the base of the skull through the vertex without intravenous contrast.  COMPARISON:  06/09/2009  FINDINGS: Right occipital encephalomalacia appears mildly progressed from the prior study with slightly increased surrounding hypoattenuation likely reflective of gliosis. There is no definite evidence of acute cortical infarct. Moderate cerebral atrophy is again seen. Remote lacunar infarcts are again noted in the bilateral thalamus. Periventricular white matter  hypodensities are compatible with mild to moderate chronic small vessel ischemic disease. There is no evidence of mass, midline shift, intracranial hemorrhage, or extra-axial fluid collection. There is a small left mastoid effusion. There is complete opacification of the left frontal sinus. There is near complete opacification of the right frontal sinus, bilateral ethmoid air cells, and bilateral maxillary sinuses. There is also near complete opacification of the left sphenoid sinus with possible fluid level.  IMPRESSION: 1. Progressive right occipital encephalomalacia. No evidence of acute intracranial abnormality. 2. New, near complete diffuse paranasal sinus opacification, suggestive of acute sinusitis.   Electronically Signed   By: Sebastian Ache   On: 01/11/2014 12:13    EKG Interpretation    Date/Time:  Friday January 09 2014 14:19:37 EST Ventricular Rate:  70 PR Interval:    QRS Duration: 162 QT Interval:  499 QTC Calculation: 538 R Axis:   130 Text Interpretation:  Accelerated junctional rhythm Nonspecific intraventricular conduction delay Probable lateral infarct, age indeterminate Probable anteroseptal infarct, recent Questionable paced rhythm Confirmed by Hansel Devan  MD, Illa Enlow ([id number]) on 01/09/2014 2:38:19  PM            MDM   1. CHF (congestive heart failure)   2. Hypernatremia   3. Acute on chronic diastolic congestive heart failure   4. Altered mental state   5. Anemia   6. Acute on chronic combined systolic and diastolic congestive heart failure   7. Chronic kidney disease, stage 4, severely decreased GFR   8. HIV disease   9. Long term (current) use of anticoagulants    Patient with known history of CHF. Today presents with clear CHF. Patient's blood pressure not elevated so treated with Lasix. Patient is already on 40 mg of Lasix daily so given 80 mg IV. Patient will require admission is followed by [facility] senior care therefore will be a triad admission. Admitting team  was contacted. Patient in no acute distress sats on room air have been in the 90s despite the significant CHF. Patient also with marked hyper nature anemia mouth does appear little dry gentle fluid hydration will be appropriate. Patient's mentation in the emergency department did improve some with hydration. Patient also with renal insufficiency acute change compared to his previous.    Shelda Jakes, MD 01/11/14 2211   Addendum: Patient was initially and briefly seen by the mid-level. Patient never formally evaluated the patient. I took over care of the patient because he was more complicated. The mid-level started a note. The mid-level's note should be deleted. Patient was seen entirely by me.  Shelda Jakes, MD 01/11/14 2212

## 2014-01-11 NOTE — Progress Notes (Signed)
ANTICOAGULATION CONSULT NOTE - Initial Consult  Pharmacy Consult for Coumadin + Lovenox Indication: atrial fibrillation  Allergies  Allergen Reactions  . Darvocet [Propoxyphene N-Acetaminophen] Nausea And Vomiting  . Seldane [Terfenadine] Nausea And Vomiting  . Sulfonylureas Other (See Comments)    Low grade fever  . Ultracet [Tramadol-Acetaminophen] Nausea And Vomiting    Patient Measurements: Height: 6' (182.9 cm) Weight: 146 lb 6.2 oz (66.4 kg) IBW/kg (Calculated) : 77.6   Vital Signs: Temp: 97.7 F (36.5 C) (01/18 0921) Temp src: Oral (01/18 0921) BP: 106/44 mmHg (01/18 0921) Pulse Rate: 72 (01/18 0921)  Labs:  Recent Labs  01/09/14 1407 01/09/14 1424 01/09/14 2040 01/10/14 0400 01/11/14 0558  HGB 9.0*  --   --  9.0*  --   HCT 31.1*  --   --  30.8*  --   PLT 96*  --   --  101*  --   LABPROT  --   --  20.4* 21.1* 20.4*  INR  --   --  1.80* 1.89* 1.80*  CREATININE 1.98*  --   --  1.93* 2.16*  TROPONINI  --  <0.30  --   --   --     Estimated Creatinine Clearance: 27.3 ml/min (by C-G formula based on Cr of 2.16).   Medical History: Past Medical History  Diagnosis Date  . CHF (congestive heart failure)     a. h/o diastolic dysfunction. b. EF 45% by echo 06/2012.  Marland Kitchen. History of diastolic dysfunction   . SSS (sick sinus syndrome)   . Coronary artery disease     s/p CABG 2010  . History of atrial flutter     s/p ablation  . Chronic renal insufficiency   . Diabetes mellitus type 2, insulin dependent   . History of GI bleed   . Gastritis   . Esophagitis   . Duodenal ulcer   . IDA (iron deficiency anemia)   . Hypertension   . Hyperlipidemia   . Hypothyroidism   . HIV positive   . RAS (renal artery stenosis)   . pacemaker-MDT     Medtronic EnRhythm generator serial [id number] H 2007 Dr. Reyes IvanKersey. [facility]     . Atrial fibrillation     By pacemaker interrogation 06/2012 - device reprogrammed     Medications:  Scheduled:  . allopurinol  250 mg  Oral Daily  . aspirin EC  81 mg Oral Daily  . atorvastatin  10 mg Oral QHS  . carvedilol  12.5 mg Oral BID WC  . darunavir  600 mg Oral BID WC  . enoxaparin (LOVENOX) injection  1 mg/kg Subcutaneous QHS  . etravirine  200 mg Oral BID WC  . hydrALAZINE  10 mg Oral Q8H  . insulin aspart  0-9 Units Subcutaneous TID WC  . insulin NPH Human  5 Units Subcutaneous QHS  . levothyroxine  75 mcg Oral QAC breakfast  . pantoprazole  40 mg Oral Daily  . predniSONE  7.5 mg Oral Q breakfast  . ritonavir  100 mg Oral BID WC  . senna-docusate  2 tablet Oral BID  . sodium chloride  3 mL Intravenous Q12H  . sodium chloride  3 mL Intravenous Q12H  . sulfamethoxazole-trimethoprim  1 tablet Oral Q M,W,F    Assessment: 79 year old [facility] weakness, increasing shortness of breath, and abnormal CXR. Patient is on chronic coumadin, per records patient has been receiving sq lovenox injections. INR has been subtherapeutic and on lovenox.  Will start coumadin per MD.  INR today is 1.80, h/h/plt are low stable.  Scr inc to 2.16, and crcl ~27; will continue to monitor. No bleeding reported in notes.   Goal of Therapy:  INR 2-3 Monitor platelets by anticoagulation protocol: Yes   Plan:  Continue Lovenox 70mg  qhs Start coumadin 2.5 mg x1 Daily INR Monitor CBC  Anabel Bene, PharmD Clinical Pharmacist Resident Pager: [phone]   Anabel Bene 01/11/2014,10:25 AM

## 2014-01-11 NOTE — Progress Notes (Signed)
[facility] PROGRESS NOTE Interim History: 79 yo male, resident of [facility] center, with multiple and very complex medical problems including systolic CHF per last 2 D ECHO 06/2012 with EF 45%, atrial fibrillation on chronic Coumadin, diabetes mellitus requiring insulin, HTN, HIV on HAART, who was transferred to [facility] ED from [facility] by EMS due to concern of abnormal CXR obtained at the facility 01/08/2013. Please note that pt is unable to provide much history and no family at bedside. Most of the information obtained from available records and ED physician.   Filed Weights   01/09/14 2005 01/10/14 0420 01/11/14 0458  Weight: 69.3 kg (152 lb 12.5 oz) 67.3 kg (148 lb 5.9 oz) 66.4 kg (146 lb 6.2 oz)        Intake/Output Summary (Last 24 hours) at 01/11/14 1128 Last data filed at 01/11/14 0900  Gross per 24 hour  Intake    246 ml  Output   1500 ml  Net  -1254 ml     Assessment/Plan: Acute on chronic combined systolic and diastolic congestive heart failure - Weight has improved from 74->67>66 Kg. ( baseline cr around 1.7-1.9). Negative 2 L.  -Appreciate cardiology help.  - Echo decrease in Ef from 45 to 35 %.  - Sodium increase today to 153. Lasix discontinue 01-11-2014. Received 40 mg IV on 1-17.  -limited treatment for heart failure, see cardiology evaluation. Multiple co morbilities. Will consult palliative for goals of care, CODE status. Will try to contact family again later today.    Hypernatremia: - Unclear etiology.  - lasix held today.  -Repeat B-met in am.  -Will avoid IV fluids due to heart failure.   Acute Thrombocytopenia/  Anemia: - MCV 80, RDW 23.9. He is on antiretroviral medications. - No sign of bleeding. Hbg at baseline. -RBC folate pending, B-12 684  and ferritin 131. -Repeat labs in am.   Altered mental state/ encephalopathy - Multifactorial due to hyponatremia and acute illness, unclear baseline. Unable to contact family.   -Check CT head. Unable to get MRI, patient has pace-maker.  - have tried to contact family and friend with no success.  Long term (current) use of anticoagulants - INR sub-therapeutic. Coumadin per pharmacy.   Hypothyroidism - mild high, repeat in 6 weeks.  HIV disease - cont meds. - continue HAART: Prezista, Intelence, Ritonavir, Epivir   Chronic kidney disease stage 4 - basleine (1.7-1.9). Cr increase to 2. Lasix stopped.   Type 2 diabetes mellitus with vascular disease - moderate control.  Code Status: Full  Family Communication: no family at bedside  Disposition Plan: remain inpatient.     Consultants:  cardiology  Procedures: ECHO: Left ventricle: The cavity size was normal. Systolic function was moderately to severely reduced. The estimated ejection fraction was in the range of 30% to 35%. Moderate diffuse hypokinesis. Doppler parameters are consistent with a reversible restrictive pattern, indicative of decreased left ventricular diastolic compliance and/or increased left atrial pressure (grade 3 diastolic dysfunction). - Aortic valve: Mild regurgitation. - Mitral valve: Mild to moderate regurgitation. - Left atrium: The atrium was severely dilated. - Right ventricle: The cavity size was dilated. Wall thickness could not be accurately determined. - Right atrium: The atrium was moderately dilated. - Pulmonary arteries: Systolic pressure was moderately increased. PA peak pressure: 27mm Hg (S).   Antibiotics: none  Prezista, Intelence, Ritonavir, Epivir   HPI/Subjective: Still confused. More alert today. Staff has notice difficulty with swallowing.   Objective: Filed Vitals:  01/10/14 1951 01/10/14 2352 01/11/14 0458 01/11/14 0921  BP: 117/54 115/60 125/52 106/44  Pulse: 68  79 72  Temp: 97.9 F (36.6 C)  99.4 F (37.4 C) 97.7 F (36.5 C)  TempSrc: Oral  Oral Oral  Resp: _0 Height:      Weight:   66.4 kg (146 lb 6.2 oz)   SpO2: 95%   98% 94%     Exam:  General: in no acute distress.  HEENT: No bruits, no goiter. + JVD Heart: Regular rate and rhythm, without murmurs, rubs, gallops.  Lungs: Good air movement, crackles at bases. Abdomen: Soft, nontender, nondistended, positive bowel sounds.     Data Reviewed: Basic Metabolic Panel:  Recent Labs Lab 01/09/14 1407 01/09/14 2040 01/10/14 0400 01/11/14 0558  NA 150*  --  151* 153*  K 4.1  --  4.1 3.8  CL 112  --  114* 111  CO2 26  --  25 26  GLUCOSE 157*  --  87 87  BUN 44*  --  43* 42*  CREATININE 1.98*  --  1.93* 2.16*  CALCIUM 8.7  --  9.0 8.9  MG  --  2.2  --   --   PHOS  --  2.7  --   --    Liver Function Tests:  Recent Labs Lab 01/09/14 1407  AST 20  ALT 10  ALKPHOS 283*  BILITOT 1.4*  PROT 6.3  ALBUMIN 2.3*   No results found for this basename: LIPASE, AMYLASE,  in the last 168 hours No results found for this basename: AMMONIA,  in the last 168 hours CBC:  Recent Labs Lab 01/09/14 1407 01/10/14 0400  WBC 6.4 7.7  NEUTROABS 3.2  --   HGB 9.0* 9.0*  HCT 31.1* 30.8*  MCV 81.6 80.0  PLT 96* 101*   Cardiac Enzymes:  Recent Labs Lab 01/09/14 1424  TROPONINI <0.30   BNP (last 3 results)  Recent Labs  01/09/14 1424 01/09/14 2040  PROBNP 41214.0* 38688.0*   CBG:  Recent Labs Lab 01/10/14 0541 01/10/14 1104 01/10/14 1649 01/10/14 2140 01/11/14 0559  GLUCAP 76 129* 123* 86 88    Recent Results (from the past 240 hour(s))  MRSA PCR SCREENING     Status: None   Collection Time    01/09/14 10:15 PM      Result Value Range Status   MRSA by PCR NEGATIVE  NEGATIVE Final   Comment:            The GeneXpert MRSA Assay (FDA     approved for NASAL specimens     only), is one component of a     comprehensive MRSA colonization     surveillance program. It is not     intended to diagnose MRSA     infection nor to guide or     monitor treatment for     MRSA infections.     Studies: Dg Chest 2 View  01/09/2014    CLINICAL DATA:  Cough, congestion, weakness  EXAM: CHEST  2 VIEW  COMPARISON:  08/11/2013  FINDINGS: There is moderate enlargement of cardiac silhouette. Cardiac pacer is in unchanged position. There is moderate vascular congestion. There is peribronchial wall thickening. There is mild diffuse interstitial prominence. There are small bilateral pleural effusions.  IMPRESSION: Congestive heart failure with moderately severe pulmonary edema   Electronically Signed   By: Skipper Cliche M.D.   On: 01/09/2014 14:58    Scheduled Meds: .  allopurinol  250 mg Oral Daily  . aspirin EC  81 mg Oral Daily  . atorvastatin  10 mg Oral QHS  . carvedilol  12.5 mg Oral BID WC  . darunavir  600 mg Oral BID WC  . enoxaparin (LOVENOX) injection  1 mg/kg Subcutaneous QHS  . etravirine  200 mg Oral BID WC  . hydrALAZINE  10 mg Oral Q8H  . insulin aspart  0-9 Units Subcutaneous TID WC  . insulin NPH Human  5 Units Subcutaneous QHS  . levothyroxine  75 mcg Oral QAC breakfast  . pantoprazole  40 mg Oral Daily  . predniSONE  7.5 mg Oral Q breakfast  . ritonavir  100 mg Oral BID WC  . senna-docusate  2 tablet Oral BID  . sodium chloride  3 mL Intravenous Q12H  . sodium chloride  3 mL Intravenous Q12H  . sulfamethoxazole-trimethoprim  1 tablet Oral Q M,W,F  . warfarin  2.5 mg Oral ONCE-1800  . Warfarin - Pharmacist Dosing Inpatient   Does not apply q1800   Continuous Infusions:    Sanvika Cuttino  [facility] Pager [phone] If 8PM-8AM, please contact night-coverage at [email or web address], password [facility] 01/11/2014, 11:28 AM  LOS: 2 days

## 2014-01-12 DIAGNOSIS — Z950 Presence of cardiac pacemaker: Secondary | ICD-10-CM

## 2014-01-12 DIAGNOSIS — I495 Sick sinus syndrome: Secondary | ICD-10-CM

## 2014-01-12 DIAGNOSIS — Z515 Encounter for palliative care: Secondary | ICD-10-CM

## 2014-01-12 LAB — GLUCOSE, CAPILLARY
GLUCOSE-CAPILLARY: 83 mg/dL (ref 70–99)
Glucose-Capillary: 151 mg/dL — ABNORMAL HIGH (ref 70–99)
Glucose-Capillary: 195 mg/dL — ABNORMAL HIGH (ref 70–99)
Glucose-Capillary: 132 mg/dL — ABNORMAL HIGH (ref 70–99)

## 2014-01-12 LAB — BASIC METABOLIC PANEL
BUN: 43 mg/dL — ABNORMAL HIGH (ref 6–23)
CHLORIDE: 114 meq/L — AB (ref 96–112)
CO2: 24 meq/L (ref 19–32)
CREATININE: 2.16 mg/dL — AB (ref 0.50–1.35)
Calcium: 8.8 mg/dL (ref 8.4–10.5)
GFR calc Af Amer: 32 mL/min — ABNORMAL LOW (ref >90)
GFR calc non Af Amer: 28 mL/min — ABNORMAL LOW (ref >90)
Glucose, Bld: 83 mg/dL (ref 70–99)
Potassium: 3.9 mEq/L (ref 3.7–5.3)
Sodium: 154 mEq/L — ABNORMAL HIGH (ref 137–147)

## 2014-01-12 LAB — CBC
HCT: 31.8 % — ABNORMAL LOW (ref 39.0–52.0)
Hemoglobin: 9.2 g/dL — ABNORMAL LOW (ref 13.0–17.0)
MCH: 23.5 pg — AB (ref 26.0–34.0)
MCHC: 28.9 g/dL — ABNORMAL LOW (ref 30.0–36.0)
MCV: 81.1 fL (ref 78.0–100.0)
Platelets: 107 10*3/uL — ABNORMAL LOW (ref 150–400)
RBC: 3.92 MIL/uL — AB (ref 4.22–5.81)
RDW: 24 % — AB (ref 11.5–15.5)
WBC: 6.9 10*3/uL (ref 4.0–10.5)

## 2014-01-12 LAB — FOLATE RBC: RBC Folate: 1065 ng/mL — ABNORMAL HIGH (ref >280)

## 2014-01-12 LAB — PROTIME-INR
INR: 1.78 — ABNORMAL HIGH (ref 0.00–1.49)
Prothrombin Time: 20.2 seconds — ABNORMAL HIGH (ref 11.6–15.2)

## 2014-01-12 LAB — T-HELPER CELLS (CD4) COUNT (NOT AT ARMC)
CD4 % Helper T Cell: 13 % — ABNORMAL LOW (ref 33–55)
CD4 T CELL ABS: 410 /uL (ref 400–2700)

## 2014-01-12 MED ORDER — RESOURCE THICKENUP CLEAR PO POWD
ORAL | Status: DC | PRN
Start: 2014-01-12 — End: 2014-01-14
  Filled 2014-01-12 (×2): qty 125

## 2014-01-12 MED ORDER — WARFARIN SODIUM 4 MG PO TABS
4.0000 mg | ORAL_TABLET | Freq: Once | ORAL | Status: AC
  Administered 2014-01-12: 4 mg via ORAL
  Filled 2014-01-12: qty 1

## 2014-01-12 MED ORDER — ENSURE PUDDING PO PUDG
1.0000 | Freq: Three times a day (TID) | ORAL | Status: DC
  Administered 2014-01-12 – 2014-01-14 (×4): 1 via ORAL

## 2014-01-12 NOTE — Progress Notes (Addendum)
Agree with intern note. Kymberley Raz Barnett RD, LDN Inpatient Clinical Dietitian Pager: [phone] After Hours Pager: [phone]  

## 2014-01-12 NOTE — Progress Notes (Signed)
ANTICOAGULATION CONSULT NOTE - Follow Up Consult  Pharmacy Consult:  Lovenox / Coumadin Indication: atrial fibrillation  Allergies  Allergen Reactions  . Darvocet [Propoxyphene N-Acetaminophen] Nausea And Vomiting  . Seldane [Terfenadine] Nausea And Vomiting  . Sulfonylureas Other (See Comments)    Low grade fever  . Ultracet [Tramadol-Acetaminophen] Nausea And Vomiting    Patient Measurements: Height: 6' (182.9 cm) Weight: 143 lb 1.3 oz (64.9 kg) IBW/kg (Calculated) : 77.6  Vital Signs: Temp: 99.3 F (37.4 C) (01/19 0600) Temp src: Oral (01/19 0600) BP: 138/67 mmHg (01/19 0600) Pulse Rate: 82 (01/19 0600)  Labs:  Recent Labs  01/09/14 1407 01/09/14 1424  01/10/14 0400 01/11/14 0558 01/12/14 0400  HGB 9.0*  --   --  9.0*  --  9.2*  HCT 31.1*  --   --  30.8*  --  31.8*  PLT 96*  --   --  101*  --  107*  LABPROT  --   --   < > 21.1* 20.4* 20.2*  INR  --   --   < > 1.89* 1.80* 1.78*  CREATININE 1.98*  --   --  1.93* 2.16* 2.16*  TROPONINI  --  <0.30  --   --   --   --   < > = values in this interval not displayed.  Estimated Creatinine Clearance: 26.7 ml/min (by C-G formula based on Cr of 2.16).     Assessment: 79 [facility], increasing shortness of breath, and abnormal CXR. Patient is on chronic Coumadin for Afib, per records patient has been receiving SQ Lovenox injections.  INR remains sub-therapeutic; no bleeding reported.  Patient has CKD and her renal function is stable.  He continues on Septra, which could significantly increase patient's INR.   Goal of Therapy:  INR 2-3 Anti-Xa level 0.6-1 units/ml 4hrs after LMWH dose given Monitor platelets by anticoagulation protocol: Yes    Plan:  - Coumadin 4mg  PO today - Continue Lovenox 70mg  SQ Q24H - Daily PT / INR - CBC Q72H while on Lovenox     Aul Mangieri D. Laney Potashang, PharmD, BCPS Pager:  [phone] 01/12/2014, 12:23 PM

## 2014-01-12 NOTE — Care Management Note (Signed)
    Page 1 of 1   01/12/2014     3:39:25 PM   CARE MANAGEMENT NOTE 01/12/2014  Patient:  [name] Proctor,[name] Proctor   Account Number:  [id number]  Date Initiated:  01/12/2014  Documentation initiated by:  [facility],Jashay Roddy  Subjective/Objective Assessment:   79 yo male, [facility] in with complex medical problems including systolic CHF, AFIB on chronic Coumadin, DM, HTN, HIV on HAART//[place]     Action/Plan:   will admit pt to telemetry bed  - place on Lasix 40 mg IV  - monitor daily weights, I's and O's//[facility]   Anticipated DC Date:  01/15/2014   Anticipated DC Plan:  SKILLED NURSING FACILITY  In-house referral  Clinical Social Worker      DC Planning Services  CM consult      Choice offered to / List presented to:             Status of service:   Medicare Important Message given?   (If response is "NO", the following Medicare IM given date fields will be blank) Date Medicare IM given:   Date Additional Medicare IM given:    Discharge Disposition:    Per UR Regulation:    If discussed at Long Length of Stay Meetings, dates discussed:    Comments:

## 2014-01-12 NOTE — Consult Note (Signed)
Patient ZO:XWRUEAV:[name] Proctor      DOB: 1935-10-14      WUJ:[id number]     Consult Note from the Palliative Medicine Team at [facility]    Consult Requested by: Dr. Sunnie Nielsenegalado     PCP: Pcp Not In System Reason for Consultation: Clarification GOC and options.  Phone Number:None  Assessment of patients Current state: I had a long discussion with [name] Proctor (he says he is HCPOA [phone]) about [name] Proctor and his goals of care. [name] Proctor has been a friend which he described as being more like brothers for over 30 years. [name] Proctor said he became [name] Proctor's HCPOA when he became estranged from his only daughter. He says that [name] Proctor is not close with his family and they may call but do not intervene/aide in his care. [name] Proctor also says that [name] Proctor doesn't like not being at home and that he has not been eating well and has been sleeping a lot for a while now. This decline began when [name] Proctor fractured his hip in August 2014 and had to [facility] extra [facility] and [facility]. [name] Proctor never completely recovered since August and has been in health decline ever since. [name] Proctor is concerned with Mr. Delorse LekFore's quality of life and believes he would not want to live this way. We had a long discussion about his natural disease trajectory with his heart failure and concern over his renal function with Lasix required for his heart.   [name] Proctor brought up his renal function and dialysis was mentioned but [name] Proctor does NOT believe that [name] Proctor would benefit from dialysis. [name] Proctor also decided on DNR and does not wish for [name] Proctor to go through any type of CPR and [name] Proctor says "he has said he would never want to be put on a respirator." MOST form was completed and [name] Proctor is also opting for more comfort approach. [name] Proctor would like to consider antibiotics and IV fluids when situations arise but has decided NO feeding tube. We talked about the body's natural dehydration at end of life and how we eat  less and less and sleep more and more. [name] Proctor says that he watched his mother die of cancer and that he recognizes these signs with [name] Proctor. We did speak briefly of hospice and [name] Proctor believes that would be a good idea for [name] Proctor.   I will continue to follow and elicit goals of care such as lab draws and continuing medications.    Goals of Care: 1.  Code Status: DNR   2. Scope of Treatment: 1. Vital Signs: yes-routine 2. Respiratory/Oxygen: yes 3. Nutritional Support/Tube Feeds: no artificial feeding 4. Antibiotics: yes 5. Blood Products: yes 6. IVF: yes 7. Review of Medications to be discontinued: continue for now 8. Labs: continue for now 9. Telemetry: continue for now 10. Consults: spiritual care   4. Disposition: To be determined on outcomes.    3. Symptom Management:   1. Pain: Oxycodone IR prn.  2. Bowel Regimen: Senna S daily.  3. Nausea/Vomiting: Ondansetron prn.   4. Psychosocial: Emotional support [facility] to [name] as he made difficult decisions and was tearful throughout our conversation.   5. Spiritual: Spiritual care consult placed.     Patient Documents Completed or Given: Document Given Completed  Advanced Directives Pkt    MOST  yes  DNR    Gone from [facility]    Hard Choices yes  Brief HPI: 79 yo male with EF 35%.   ROS: Unable to elicit - AMS.    PMH:  Past Medical History  Diagnosis Date  . CHF (congestive heart failure)     a. h/o diastolic dysfunction. b. EF 45% by echo 06/2012.  Marland Kitchen History of diastolic dysfunction   . SSS (sick sinus syndrome)   . Coronary artery disease     s/p CABG 2010  . History of atrial flutter     s/p ablation  . Chronic renal insufficiency   . Diabetes mellitus type 2, insulin dependent   . History of GI bleed   . Gastritis   . Esophagitis   . Duodenal ulcer   . IDA (iron deficiency anemia)   . Hypertension   . Hyperlipidemia   . Hypothyroidism   . HIV positive   . RAS (renal artery  stenosis)   . pacemaker-MDT     Medtronic EnRhythm generator serial [id number] H 2007 Dr. Reyes Ivan. Replaced at [facility] 2012     . Atrial fibrillation     By pacemaker interrogation 06/2012 - device reprogrammed      PSH: Past Surgical History  Procedure Laterality Date  . Cardiac catheterization  02/22/2009    EF 40-45%  . Coronary angioplasty  03/06/2006    INTRACORONARY STENTING OF SAPHENOUS VEIN GRAFT TO THE PDA AND INTRACORONARY STENTING OF THE SAPHENOUS VEIN GRAFT TO THE DIAGONAL  . Insert / replace / remove pacemaker      IMPLANT  . Ablation saphenous vein w/ rfa    . Renal artery stent      LEFT  . Coronary artery bypass graft  02/2009    REDO. NEW SAPHENOUS VEIN GRAFT TO THE OBTUSE MARGINAL VESSEL AND A SEQUENTIAL SAPHENOUS VEIN GRAFT TO THE PDA AND POSTERIOR LATERAL BRANCHES OF THE RIGHT CORONARY. THE LIMA GRAFT FROM HIS ORIGINAL SURGERY WAS STILL PATENT.  Marland Kitchen Pacemaker generator change  3/12    done at [facility]  . Total hip arthroplasty Right 08/13/2013    Procedure: Right Hip Hemiarthroplasty;  Surgeon: Sheral Apley, MD;  Location: [facility] OR;  Service: Orthopedics;  Laterality: Right;   I have reviewed the FH and SH and  If appropriate update it with new information. Allergies  Allergen Reactions  . Darvocet [Propoxyphene N-Acetaminophen] Nausea And Vomiting  . Seldane [Terfenadine] Nausea And Vomiting  . Sulfonylureas Other (See Comments)    Low grade fever  . Ultracet [Tramadol-Acetaminophen] Nausea And Vomiting   Scheduled Meds: . allopurinol  250 mg Oral Daily  . aspirin EC  81 mg Oral Daily  . atorvastatin  10 mg Oral QHS  . carvedilol  12.5 mg Oral BID WC  . darunavir  600 mg Oral BID WC  . enoxaparin (LOVENOX) injection  1 mg/kg Subcutaneous QHS  . etravirine  200 mg Oral BID WC  . hydrALAZINE  10 mg Oral Q8H  . insulin aspart  0-9 Units Subcutaneous TID WC  . insulin NPH Human  5 Units Subcutaneous QHS  . levothyroxine  75 mcg Oral QAC breakfast  . pantoprazole  40 mg  Oral Daily  . predniSONE  7.5 mg Oral Q breakfast  . ritonavir  100 mg Oral BID WC  . senna-docusate  2 tablet Oral BID  . sodium chloride  3 mL Intravenous Q12H  . sodium chloride  3 mL Intravenous Q12H  . sulfamethoxazole-trimethoprim  1 tablet Oral Q M,W,F  . Warfarin - Pharmacist Dosing Inpatient   Does not apply [phone]  Continuous Infusions:  PRN Meds:.sodium chloride, ondansetron (ZOFRAN) IV, ondansetron, oxyCODONE, sodium chloride    BP 138/67  Pulse 82  Temp(Src) 99.3 F (37.4 C) (Oral)  Resp 17  Ht 6' (1.829 m)  Wt 64.9 kg (143 lb 1.3 oz)  BMI 19.40 kg/m2  SpO2 100%   PPS: 30% at best   Intake/Output Summary (Last 24 hours) at 01/12/14 1012 Last data filed at 01/11/14 2200  Gross per 24 hour  Intake    633 ml  Output    503 ml  Net    130 ml   LBM: 01/12/14               Physical Exam:  General: NAD, lethargic HEENT:  [place]/AT, mucous membranes dry, +JVD Chest: CTA with scattered crackles in bases CVS: RRR, S1 S2 Abdomen: Soft, NT, ND, +BS Ext: MAE, no edema, skin very dry Neuro: Lethargic, unable to follow commands  Labs: CBC    Component Value Date/Time   WBC 6.9 01/12/2014 0400   RBC 3.92* 01/12/2014 0400   RBC 3.56* 01/10/2014 1159   HGB 9.2* 01/12/2014 0400   HCT 31.8* 01/12/2014 0400   PLT 107* 01/12/2014 0400   MCV 81.1 01/12/2014 0400   MCH 23.5* 01/12/2014 0400   MCHC 28.9* 01/12/2014 0400   RDW 24.0* 01/12/2014 0400   LYMPHSABS 2.4 01/09/2014 1407   MONOABS 0.7 01/09/2014 1407   EOSABS 0.1 01/09/2014 1407   BASOSABS 0.0 01/09/2014 1407    BMET    Component Value Date/Time   NA 154* 01/12/2014 0400   K 3.9 01/12/2014 0400   CL 114* 01/12/2014 0400   CO2 24 01/12/2014 0400   GLUCOSE 83 01/12/2014 0400   BUN 43* 01/12/2014 0400   CREATININE 2.16* 01/12/2014 0400   CALCIUM 8.8 01/12/2014 0400   GFRNONAA 28* 01/12/2014 0400   GFRAA 32* 01/12/2014 0400    CMP     Component Value Date/Time   NA 154* 01/12/2014 0400   K 3.9 01/12/2014 0400   CL 114*  01/12/2014 0400   CO2 24 01/12/2014 0400   GLUCOSE 83 01/12/2014 0400   BUN 43* 01/12/2014 0400   CREATININE 2.16* 01/12/2014 0400   CALCIUM 8.8 01/12/2014 0400   PROT 6.3 01/09/2014 1407   ALBUMIN 2.3* 01/09/2014 1407   AST 20 01/09/2014 1407   ALT 10 01/09/2014 1407   ALKPHOS 283* 01/09/2014 1407   BILITOT 1.4* 01/09/2014 1407   GFRNONAA 28* 01/12/2014 0400   GFRAA 32* 01/12/2014 0400     Time In Time Out Total Time Spent with Patient Total Overall Time  0915 1020     Greater than 50%  of this time was spent counseling and coordinating care related to the above assessment and plan.  Yong Channel, NP Palliative Medicine Team Team Phone # [phone]

## 2014-01-12 NOTE — Progress Notes (Signed)
Pt alert to self, no c/o pain, pt has poor po intake, ensure milkshake offered, family friend at bedside, vss, pt stable

## 2014-01-12 NOTE — Progress Notes (Signed)
INITIAL NUTRITION ASSESSMENT  DOCUMENTATION CODES Per approved criteria  -Severe malnutrition in the context of chronic illness   INTERVENTION: 1.  Ensure pudding TID, each pudding contains 170 kcals and 4 grams protein  2.  If patient's diet is upgraded to thin liquids, change PO supplement to Ensure Complete TID, each supplement contains 350 kcals and 13 grams protein.  NUTRITION DIAGNOSIS: Inadequate oral intake related to dysphagia as evidenced by weight loss and PO intake 10-20%.   Goal: Patient to meet >/= 90% of estimated nutrition needs  Monitor:  Weight, labs, I/Os, PO intake and diet advancement, PO supplement acceptance  Reason for Assessment: Malnutrition Screening Tool Risk   79 y.o. male  Admitting Dx: Acute on chronic combined systolic and diastolic congestive heart failure  ASSESSMENT: Patient is a 79 yo male, resident of [facility], with multiple and very complex medical problems including systolic CHF per last 2 D ECHO 06/2012 with EF 45%, atrial fibrillation on chronic Coumadin, diabetes mellitus requiring insulin, HTN, HIV on HAART, who was transferred to [facility] ED from [facility] by EMS due to concern of abnormal CXR obtained at the facility 01/08/2013.  Upon dietetic intern visit, patient was working with speech therapy. Patient was unable to provide any dietary or weight history. Per medical records, patient has lost 20 pounds since August. Also per medical records, patient is consuming 10-20% PO meals. No PO supplements were noted in paper chart from [facility] rehab center. However, during dietetic intern visit, patient had an empty Ensure Complete at bedside. Speech reported that patient was tolerating thin liquids and he might be able to upgrade from nectar thick liquids to thin liquids.   Nutrition Focused Physical Exam:  Subcutaneous Fat:  Orbital Region: severe depletion Upper Arm Region: N/A Thoracic and Lumbar  Region: mild depletion  Muscle:  Temple Region: moderate depletion Clavicle Bone Region: moderate depletion Clavicle and Acromion Bone Region: moderate depletion Scapular Bone Region: N/A Dorsal Hand: moderate depletion Patellar Region: severe depletion Anterior Thigh Region: severe depletion Posterior Calf Region: severe depletion  Edema: absent  Patient meets criteria for severe malnutrition in the context of chronic illness as evidenced by >10% weight loss in 6 months and severe muscle mass depletion.  Height: Ht Readings from Last 1 Encounters:  01/09/14 6' (1.829 m)    Weight: Wt Readings from Last 1 Encounters:  01/12/14 143 lb 1.3 oz (64.9 kg)    Ideal Body Weight: 178 lb  % Ideal Body Weight: 80%  Wt Readings from Last 10 Encounters:  01/12/14 143 lb 1.3 oz (64.9 kg)  08/14/13 163 lb 5.8 oz (74.1 kg)  08/14/13 163 lb 5.8 oz (74.1 kg)  08/14/13 163 lb 5.8 oz (74.1 kg)  07/09/12 163 lb 5.8 oz (74.1 kg)  10/04/11 172 lb (78.019 kg)    Usual Body Weight: 163 lb  % Usual Body Weight: 88%  BMI:  Body mass index is 19.4 kg/(m^2).  Estimated Nutritional Needs: Kcal: 1900-2100 Protein: 100-110 grams Fluid: 1.5L  Skin: no wounds  Diet Order: Dysphagia 1 with nectar thick liquids  EDUCATION NEEDS: -No education needs identified at this time   Intake/Output Summary (Last 24 hours) at 01/12/14 1540 Last data filed at 01/12/14 1030  Gross per 24 hour  Intake    493 ml  Output    351 ml  Net    142 ml    Last BM: 1/19   Labs:   Recent Labs Lab 01/09/14 2040  01/10/14 0400 01/11/14 0558 01/12/14 0400  NA  --  151* 153* 154*  K  --  4.1 3.8 3.9  CL  --  114* 111 114*  CO2  --  25 26 24   BUN  --  43* 42* 43*  CREATININE  --  1.93* 2.16* 2.16*  CALCIUM  --  9.0 8.9 8.8  MG 2.2  --   --   --   PHOS 2.7  --   --   --   GLUCOSE  --  87 87 83    CBG (last 3)   Recent Labs  01/11/14 2253 01/12/14 0611 01/12/14 1108  GLUCAP 96 83 151*     Scheduled Meds: . allopurinol  250 mg Oral Daily  . aspirin EC  81 mg Oral Daily  . atorvastatin  10 mg Oral QHS  . carvedilol  12.5 mg Oral BID WC  . darunavir  600 mg Oral BID WC  . enoxaparin (LOVENOX) injection  1 mg/kg Subcutaneous QHS  . etravirine  200 mg Oral BID WC  . hydrALAZINE  10 mg Oral Q8H  . insulin aspart  0-9 Units Subcutaneous TID WC  . insulin NPH Human  5 Units Subcutaneous QHS  . levothyroxine  75 mcg Oral QAC breakfast  . pantoprazole  40 mg Oral Daily  . predniSONE  7.5 mg Oral Q breakfast  . ritonavir  100 mg Oral BID WC  . senna-docusate  2 tablet Oral BID  . sodium chloride  3 mL Intravenous Q12H  . sulfamethoxazole-trimethoprim  1 tablet Oral Q M,W,F  . warfarin  4 mg Oral ONCE-1800  . Warfarin - Pharmacist Dosing Inpatient   Does not apply q1800    Continuous Infusions:   Past Medical History  Diagnosis Date  . CHF (congestive heart failure)     a. h/o diastolic dysfunction. b. EF 45% by echo 06/2012.  Marland Kitchen. History of diastolic dysfunction   . SSS (sick sinus syndrome)   . Coronary artery disease     s/p CABG 2010  . History of atrial flutter     s/p ablation  . Chronic renal insufficiency   . Diabetes mellitus type 2, insulin dependent   . History of GI bleed   . Gastritis   . Esophagitis   . Duodenal ulcer   . IDA (iron deficiency anemia)   . Hypertension   . Hyperlipidemia   . Hypothyroidism   . HIV positive   . RAS (renal artery stenosis)   . pacemaker-MDT     Medtronic EnRhythm generator serial [id number] H 2007 Dr. Reyes IvanKersey. [facility]     . Atrial fibrillation     By pacemaker interrogation 06/2012 - device reprogrammed     Past Surgical History  Procedure Laterality Date  . Cardiac catheterization  02/22/2009    EF 40-45%  . Coronary angioplasty  03/06/2006    INTRACORONARY STENTING OF SAPHENOUS VEIN GRAFT TO THE PDA AND INTRACORONARY STENTING OF THE SAPHENOUS VEIN GRAFT TO THE DIAGONAL  . Insert / replace / remove  pacemaker      IMPLANT  . Ablation saphenous vein w/ rfa    . Renal artery stent      LEFT  . Coronary artery bypass graft  02/2009    REDO. NEW SAPHENOUS VEIN GRAFT TO THE OBTUSE MARGINAL VESSEL AND A SEQUENTIAL SAPHENOUS VEIN GRAFT TO THE PDA AND POSTERIOR LATERAL BRANCHES OF THE RIGHT CORONARY. THE LIMA GRAFT FROM HIS ORIGINAL SURGERY WAS STILL  PATENT.  Marland Kitchen Pacemaker generator change  3/12    done at [facility]  . Total hip arthroplasty Right 08/13/2013    Procedure: Right Hip Hemiarthroplasty;  Surgeon: Sheral Apley, MD;  Location: [facility] OR;  Service: Orthopedics;  Laterality: Right;    Marlane Mingle, Dietetic Intern Pager: [phone]

## 2014-01-12 NOTE — Progress Notes (Signed)
Pt alert to self, no c/o pain, pt has skin tear to right elbow mepilex applied, skin tear still bleeding, dressing changed and wrapped with Kerlix, pt has poor po intake, ensures shakes given, vss, pt stable

## 2014-01-12 NOTE — Progress Notes (Signed)
[facility] PROGRESS NOTE Interim History: 79 yo male, resident of [facility], with multiple and very complex medical problems including systolic CHF per last 2 D ECHO 06/2012 with EF 45%, atrial fibrillation on chronic Coumadin, diabetes mellitus requiring insulin, HTN, HIV on HAART, who was transferred to [facility] ED from [facility] by EMS due to concern of abnormal CXR obtained at the facility 01/08/2013. Please note that pt is unable to provide much history and no family at bedside. Most of the information obtained from available records and ED physician.   Filed Weights   01/10/14 0420 01/11/14 0458 01/12/14 0600  Weight: 67.3 kg (148 lb 5.9 oz) 66.4 kg (146 lb 6.2 oz) 64.9 kg (143 lb 1.3 oz)        Intake/Output Summary (Last 24 hours) at 01/12/14 1126 Last data filed at 01/12/14 1030  Gross per 24 hour  Intake    493 ml  Output    852 ml  Net   -359 ml     Assessment/Plan: Acute on chronic combined systolic and diastolic congestive heart failure - Weight has improved from 74->67>66 Kg. ( baseline cr around 1.7-1.9). Negative 2 L.  -Appreciate cardiology help.  - Echo decrease in Ef from 45 to 35 %.  - Sodium increase today to 153. Lasix discontinue 01-11-2014. Received 40 mg IV on 1-17.  -limited treatment for heart failure, see cardiology evaluation. Multiple co morbilities.  -Appreciate palliative Care evaluation. Patient now DNR. Further discussion for goals of care to follow.   Hypernatremia: - Unclear etiology.  - lasix held today.  -Repeat B-met in am.  -Will avoid IV fluids due to heart failure.  -Encourage oral intake.   Acute Thrombocytopenia/  Anemia: - MCV 80, RDW 23.9. He is on antiretroviral medications. - No sign of bleeding. Hbg at baseline. -RBC folate pending, B-12 684  and ferritin 131. -Stable.  Altered mental state/ encephalopathy - Multifactorial due to hyponatremia and acute illness, unclear baseline. Unable to  contact family.  - CT head: Progressive right occipital encephalomalacia. No evidence of acute intracranial abnormality. -Unable to get MRI, patient has pace-maker.    Long term (current) use of anticoagulants - INR sub-therapeutic. Coumadin per pharmacy.   Hypothyroidism - mild high, repeat in 6 weeks.  HIV disease - cont meds. - continue HAART: Prezista, Intelence, Ritonavir, Epivir   Chronic kidney disease stage 4 - basleine (1.7-1.9). Cr increase to 2. Lasix stopped 1-18.   Type 2 diabetes mellitus with vascular disease - moderate control.  Code Status: DNR Family Communication: no family at bedside  Disposition Plan: remain inpatient.     Consultants:  Cardiology  Palliative care  Procedures: ECHO: Left ventricle: The cavity size was normal. Systolic function was moderately to severely reduced. The estimated ejection fraction was in the range of 30% to 35%. Moderate diffuse hypokinesis. Doppler parameters are consistent with a reversible restrictive pattern, indicative of decreased left ventricular diastolic compliance and/or increased left atrial pressure (grade 3 diastolic dysfunction). - Aortic valve: Mild regurgitation. - Mitral valve: Mild to moderate regurgitation. - Left atrium: The atrium was severely dilated. - Right ventricle: The cavity size was dilated. Wall thickness could not be accurately determined. - Right atrium: The atrium was moderately dilated. - Pulmonary arteries: Systolic pressure was moderately increased. PA peak pressure: 19mm Hg (S).   Antibiotics: none  Prezista, Intelence, Ritonavir, Epivir   HPI/Subjective: Still confused. Alert, speech not clear.   Objective: Filed Vitals:   01/11/14  1300 01/11/14 1622 01/11/14 2051 01/12/14 0600  BP: 123/57 136/74 116/57 138/67  Pulse: 67  68 82  Temp: 97.5 F (36.4 C)  98 F (36.7 C) 99.3 F (37.4 C)  TempSrc: Oral  Oral Oral  Resp: $Remo'18  18 17  'BIbxd$ Height:      Weight:    64.9 kg  (143 lb 1.3 oz)  SpO2: 97%  98% 100%     Exam:  General: in no acute distress.  HEENT: No bruits, no goiter. + JVD Heart: Regular rate and rhythm, without murmurs, rubs, gallops.  Lungs: Good air movement, crackles at bases. Abdomen: Soft, nontender, nondistended, positive bowel sounds.     Data Reviewed: Basic Metabolic Panel:  Recent Labs Lab 01/09/14 1407 01/09/14 2040 01/10/14 0400 01/11/14 0558 01/12/14 0400  NA 150*  --  151* 153* 154*  K 4.1  --  4.1 3.8 3.9  CL 112  --  114* 111 114*  CO2 26  --  $R'25 26 24  'WH$ GLUCOSE 157*  --  87 87 83  BUN 44*  --  43* 42* 43*  CREATININE 1.98*  --  1.93* 2.16* 2.16*  CALCIUM 8.7  --  9.0 8.9 8.8  MG  --  2.2  --   --   --   PHOS  --  2.7  --   --   --    Liver Function Tests:  Recent Labs Lab 01/09/14 1407  AST 20  ALT 10  ALKPHOS 283*  BILITOT 1.4*  PROT 6.3  ALBUMIN 2.3*   No results found for this basename: LIPASE, AMYLASE,  in the last 168 hours No results found for this basename: AMMONIA,  in the last 168 hours CBC:  Recent Labs Lab 01/09/14 1407 01/10/14 0400 01/12/14 0400  WBC 6.4 7.7 6.9  NEUTROABS 3.2  --   --   HGB 9.0* 9.0* 9.2*  HCT 31.1* 30.8* 31.8*  MCV 81.6 80.0 81.1  PLT 96* 101* 107*   Cardiac Enzymes:  Recent Labs Lab 01/09/14 1424  TROPONINI <0.30   BNP (last 3 results)  Recent Labs  01/09/14 1424 01/09/14 2040  PROBNP 41214.0* 38688.0*   CBG:  Recent Labs Lab 01/11/14 0559 01/11/14 1141 01/11/14 1618 01/11/14 2253 01/12/14 0611  GLUCAP 88 169* 121* 96 83    Recent Results (from the past 240 hour(s))  URINE CULTURE     Status: None   Collection Time    01/09/14 10:14 PM      Result Value Range Status   Specimen Description URINE, CATHETERIZED   Final   Special Requests NONE   Final   Culture  Setup Time     Final   Value: 01/10/2014 11:35     Performed at [place] PENDING   Incomplete   Culture     Final   Value: Culture  reincubated for better growth     Performed at [facility]   Report Status PENDING   Incomplete  MRSA PCR SCREENING     Status: None   Collection Time    01/09/14 10:15 PM      Result Value Range Status   MRSA by PCR NEGATIVE  NEGATIVE Final   Comment:            The GeneXpert MRSA Assay (FDA     approved for NASAL specimens     only), is one component of a     comprehensive MRSA colonization  surveillance program. It is not     intended to diagnose MRSA     infection nor to guide or     monitor treatment for     MRSA infections.     Studies: Ct Head Wo Contrast  01/11/2014   CLINICAL DATA:  Altered mental status.  EXAM: CT HEAD WITHOUT CONTRAST  TECHNIQUE: Contiguous axial images were obtained from the base of the skull through the vertex without intravenous contrast.  COMPARISON:  06/09/2009  FINDINGS: Right occipital encephalomalacia appears mildly progressed from the prior study with slightly increased surrounding hypoattenuation likely reflective of gliosis. There is no definite evidence of acute cortical infarct. Moderate cerebral atrophy is again seen. Remote lacunar infarcts are again noted in the bilateral thalamus. Periventricular white matter hypodensities are compatible with mild to moderate chronic small vessel ischemic disease. There is no evidence of mass, midline shift, intracranial hemorrhage, or extra-axial fluid collection. There is a small left mastoid effusion. There is complete opacification of the left frontal sinus. There is near complete opacification of the right frontal sinus, bilateral ethmoid air cells, and bilateral maxillary sinuses. There is also near complete opacification of the left sphenoid sinus with possible fluid level.  IMPRESSION: 1. Progressive right occipital encephalomalacia. No evidence of acute intracranial abnormality. 2. New, near complete diffuse paranasal sinus opacification, suggestive of acute sinusitis.   Electronically Signed    By: Logan Bores   On: 01/11/2014 12:13    Scheduled Meds: . allopurinol  250 mg Oral Daily  . aspirin EC  81 mg Oral Daily  . atorvastatin  10 mg Oral QHS  . carvedilol  12.5 mg Oral BID WC  . darunavir  600 mg Oral BID WC  . enoxaparin (LOVENOX) injection  1 mg/kg Subcutaneous QHS  . etravirine  200 mg Oral BID WC  . hydrALAZINE  10 mg Oral Q8H  . insulin aspart  0-9 Units Subcutaneous TID WC  . insulin NPH Human  5 Units Subcutaneous QHS  . levothyroxine  75 mcg Oral QAC breakfast  . pantoprazole  40 mg Oral Daily  . predniSONE  7.5 mg Oral Q breakfast  . ritonavir  100 mg Oral BID WC  . senna-docusate  2 tablet Oral BID  . sodium chloride  3 mL Intravenous Q12H  . sulfamethoxazole-trimethoprim  1 tablet Oral Q M,W,F  . Warfarin - Pharmacist Dosing Inpatient   Does not apply q1800   Continuous Infusions:    [name] Proctor  [facility] Pager [phone] If 8PM-8AM, please contact night-coverage at [email or web address], password [email or web address] 01/12/2014, 11:26 AM  LOS: 3 days

## 2014-01-12 NOTE — Evaluation (Signed)
Clinical/Bedside Swallow Evaluation Patient Details  Name: [name] BirchwoodWilliam E Florentino MRN: [id number] Date of Birth: 1935/01/31  Today's Date: 01/12/2014 Time: [id number] SLP Time Calculation (min): 22 min  Past Medical History:  Past Medical History  Diagnosis Date  . CHF (congestive heart failure)     a. h/o diastolic dysfunction. b. EF 45% by echo 06/2012.  Marland Kitchen. History of diastolic dysfunction   . SSS (sick sinus syndrome)   . Coronary artery disease     s/p CABG 2010  . History of atrial flutter     s/p ablation  . Chronic renal insufficiency   . Diabetes mellitus type 2, insulin dependent   . History of GI bleed   . Gastritis   . Esophagitis   . Duodenal ulcer   . IDA (iron deficiency anemia)   . Hypertension   . Hyperlipidemia   . Hypothyroidism   . HIV positive   . RAS (renal artery stenosis)   . pacemaker-MDT     Medtronic EnRhythm generator serial [id number] H 2007 Dr. Reyes IvanKersey. [facility]     . Atrial fibrillation     By pacemaker interrogation 06/2012 - device reprogrammed    Past Surgical History:  Past Surgical History  Procedure Laterality Date  . Cardiac catheterization  02/22/2009    EF 40-45%  . Coronary angioplasty  03/06/2006    INTRACORONARY STENTING OF SAPHENOUS VEIN GRAFT TO THE PDA AND INTRACORONARY STENTING OF THE SAPHENOUS VEIN GRAFT TO THE DIAGONAL  . Insert / replace / remove pacemaker      IMPLANT  . Ablation saphenous vein w/ rfa    . Renal artery stent      LEFT  . Coronary artery bypass graft  02/2009    REDO. NEW SAPHENOUS VEIN GRAFT TO THE OBTUSE MARGINAL VESSEL AND A SEQUENTIAL SAPHENOUS VEIN GRAFT TO THE PDA AND POSTERIOR LATERAL BRANCHES OF THE RIGHT CORONARY. THE LIMA GRAFT FROM HIS ORIGINAL SURGERY WAS STILL PATENT.  Marland Kitchen. Pacemaker generator change  3/12    [facility]  . Total hip arthroplasty Right 08/13/2013    Procedure: Right Hip Hemiarthroplasty;  Surgeon: Sheral Apleyimothy D Murphy, MD;  [facility];  Service: Orthopedics;  Laterality: Right;    HPI:  79 yo male, [facility], with multiple and very complex medical problems including systolic CHF per last 2 D ECHO 06/2012 with EF 45%, atrial fibrillation on chronic Coumadin, diabetes mellitus requiring insulin, HTN, HIV on HAART, [facility] due to concern of abnormal CXR obtained at the facility 01/08/2013. Please note that pt is unable to provide much history and no family at bedside. Most of the information obtained from available records and ED physician.    Assessment / Plan / Recommendation Clinical Impression  Pt does not evidence any overt signs of aspiration, but demosntrate a mild dysphagia, suspicous for a primary esopahgeal issues. Pt noted to have multiple swallows following some belching. Vocal quality remained dry. Pt was able to consume pureed solids without difficulty. His mentation is not optimal and dentition is poor so further solid trials were not attempted. At this time, pt is on nectar thick liquids, though [facility] reports pt was on thin liqudis PTA. At this time, pt may continue puree/nectar thick liquids given suboptimal arousal and reports of coughing with staff at lunch. Hopeful for upgrade to thin if presentation with therapists is consistent.  Aspiration Risk  Moderate    Diet Recommendation Dysphagia 1 (Puree);Nectar-thick liquid   Liquid Administration via: Cup;Straw Medication Administration: Whole meds with puree Supervision: Staff to assist with self feeding Compensations: Slow rate;Small sips/bites;Follow solids with liquid Postural Changes and/or Swallow Maneuvers: Seated upright 90 degrees;Upright 30-60 min after meal    Other  Recommendations Oral Care Recommendations: Oral care BID Other Recommendations: Order thickener from pharmacy   Follow Up Recommendations  Skilled Nursing facility    Frequency and Duration min 2x/week  2  weeks   Pertinent Vitals/Pain NA    SLP Swallow Goals     Swallow Study Prior Functional Status       General HPI: 79 yo male, resident of [facility], with multiple and very complex medical problems including systolic CHF per last 2 D ECHO 06/2012 with EF 45%, atrial fibrillation on chronic Coumadin, diabetes mellitus requiring insulin, HTN, HIV on HAART, who was transferred to [facility] ED from [facility] by EMS due to concern of abnormal CXR obtained at the facility 01/08/2013. Please note that pt is unable to provide much history and no family at bedside. Most of the information obtained from available records and ED physician.  Type of Study: Bedside swallow evaluation Previous Swallow Assessment: none in chart, [facility] reports pt was on Ground, thin diet.  Diet Prior to this Study: Dysphagia 1 (puree);Nectar-thick liquids Temperature Spikes Noted: No Respiratory Status: Room air Behavior/Cognition: Lethargic;Requires cueing Oral Cavity - Dentition: Poor condition;Edentulous (edentulous on top) Self-Feeding Abilities: Total assist Patient Positioning: Upright in bed Baseline Vocal Quality: Clear Volitional Cough: Cognitively unable to elicit Volitional Swallow: Unable to elicit    Oral/Motor/Sensory Function Overall Oral Motor/Sensory Function: Appears within functional limits for tasks assessed (does not follow commands)   Ice Chips     Thin Liquid Thin Liquid: Impaired Presentation: Cup;Straw Oral Phase Functional Implications: Prolonged oral transit Pharyngeal  Phase Impairments: Suspected delayed Swallow;Multiple swallows    Nectar Thick     Honey Thick     Puree Puree: Within functional limits Presentation: Spoon   Solid   GO    Solid: Not tested      Harlon Ditty, MA CCC-SLP [phone]  Claudine Mouton 01/12/2014,3:42 PM

## 2014-01-12 NOTE — Progress Notes (Signed)
Patient Name: [name] Proctor Date of Encounter: 01/12/2014     Principal Problem:   Acute on chronic combined systolic and diastolic congestive heart failure Active Problems:   Coronary artery disease   Chronic kidney disease stage 4   Type 2 diabetes mellitus with vascular disease   HIV disease   Hypothyroidism   Anemia   Long term (current) use of anticoagulants   Thrombocytopenia   Hypernatremia   Altered mental state    SUBJECTIVE  Patient knows his name and can definitely tell me he has no pain anywhere but he does have SOB. Patient difficult to talk to as he is moaning as if he is in pain and saying things like "i am dying."  CURRENT MEDS . allopurinol  250 mg Oral Daily  . aspirin EC  81 mg Oral Daily  . atorvastatin  10 mg Oral QHS  . carvedilol  12.5 mg Oral BID WC  . darunavir  600 mg Oral BID WC  . enoxaparin (LOVENOX) injection  1 mg/kg Subcutaneous QHS  . etravirine  200 mg Oral BID WC  . hydrALAZINE  10 mg Oral Q8H  . insulin aspart  0-9 Units Subcutaneous TID WC  . insulin NPH Human  5 Units Subcutaneous QHS  . levothyroxine  75 mcg Oral QAC breakfast  . pantoprazole  40 mg Oral Daily  . predniSONE  7.5 mg Oral Q breakfast  . ritonavir  100 mg Oral BID WC  . senna-docusate  2 tablet Oral BID  . sodium chloride  3 mL Intravenous Q12H  . sodium chloride  3 mL Intravenous Q12H  . sulfamethoxazole-trimethoprim  1 tablet Oral Q M,W,F  . Warfarin - Pharmacist Dosing Inpatient   Does not apply q1800    OBJECTIVE  Filed Vitals:   01/11/14 1300 01/11/14 1622 01/11/14 2051 01/12/14 0600  BP: 123/57 136/74 116/57 138/67  Pulse: 67  68 82  Temp: 97.5 F (36.4 C)  98 F (36.7 C) 99.3 F (37.4 C)  TempSrc: Oral  Oral Oral  Resp: $Remo'18  18 17  'PXCsX$ Height:      Weight:    143 lb 1.3 oz (64.9 kg)  SpO2: 97%  98% 100%    Intake/Output Summary (Last 24 hours) at 01/12/14 0836 Last data filed at 01/11/14 2200  Gross per 24 hour  Intake    753 ml  Output     503 ml  Net    250 ml   Filed Weights   01/10/14 0420 01/11/14 0458 01/12/14 0600  Weight: 148 lb 5.9 oz (67.3 kg) 146 lb 6.2 oz (66.4 kg) 143 lb 1.3 oz (64.9 kg)    PHYSICAL EXAM  General: Pleasant, NAD. Cachectic  Neuro: Alert and oriented X 3. Moves all extremities spontaneously. Psych: Normal affect. HEENT:  Normal  Neck: Supple without bruits or No JVD. Lungs:  Decreased breath sounds with crackles at bases Heart: RRR no s3, s4, or murmurs. Distant heart sounds.  Abdomen: Soft, non-tender, non-distended, BS + x 4.  Extremities: No clubbing, cyanosis or edema. DP/PT/Radials 2+ and equal bilaterally. Muscle wasting   Accessory Clinical Findings  CBC  Recent Labs  01/09/14 1407 01/10/14 0400 01/12/14 0400  WBC 6.4 7.7 6.9  NEUTROABS 3.2  --   --   HGB 9.0* 9.0* 9.2*  HCT 31.1* 30.8* 31.8*  MCV 81.6 80.0 81.1  PLT 96* 101* 035*   Basic Metabolic Panel  Recent Labs  01/09/14 2040  01/11/14 0558 01/12/14  0400  NA  --   < > 153* 154*  K  --   < > 3.8 3.9  CL  --   < > 111 114*  CO2  --   < > 26 24  GLUCOSE  --   < > 87 83  BUN  --   < > 42* 43*  CREATININE  --   < > 2.16* 2.16*  CALCIUM  --   < > 8.9 8.8  MG 2.2  --   --   --   PHOS 2.7  --   --   --   < > = values in this interval not displayed. Liver Function Tests  Recent Labs  01/09/14 1407  AST 20  ALT 10  ALKPHOS 283*  BILITOT 1.4*  PROT 6.3  ALBUMIN 2.3*    Cardiac Enzymes  Recent Labs  01/09/14 1424  TROPONINI <0.30    Hemoglobin A1C  Recent Labs  01/09/14 2040  HGBA1C 7.6*    Thyroid Function Tests  Recent Labs  01/09/14 2040  TSH 5.927*    TELE  Ventricular paced rhythm    Radiology/Studies ECHO: 01/10/2014 ------------------------------------------------ LV EF: 30% - 35% ------------------------------------------------------------ Indications: Dyspnea 786.09. ------------------------------------------------------------ Study Conclusions - Left ventricle:  The cavity size was normal. Systolic function was moderately to severely reduced. The estimated ejection fraction was in the range of 30% to 35%. Moderate diffuse hypokinesis. Doppler parameters are consistent with a reversible restrictive pattern, indicative of decreased left ventricular diastolic compliance and/or increased left atrial pressure (grade 3 diastolic dysfunction). - Aortic valve: Mild regurgitation. - Mitral valve: Mild to moderate regurgitation. - Left atrium: The atrium was severely dilated. - Right ventricle: The cavity size was dilated. Wall thickness could not be accurately determined. - Right atrium: The atrium was moderately dilated. - Pulmonary arteries: Systolic pressure was moderately increased. PA peak pressure: 12mm Hg (S). ------------------------------------------------------------ Labs, prior tests, procedures, and surgery: Permanent pacemaker system implantation. ------------------------------------------------------------ Left ventricle: The cavity size was normal. Systolic function was moderately to severely reduced. The estimated ejection fraction was in the range of 30% to 35%. Moderate diffuse hypokinesis. Doppler parameters are consistent with a reversible restrictive pattern, indicative of decreased left ventricular diastolic compliance and/or increased left atrial pressure (grade 3 diastolic dysfunction). ------------------------------------------------------------ Aortic valve: Trileaflet; normal thickness leaflets. Mobility was not restricted. Doppler: Transvalvular velocity was within the normal range. There was no stenosis. Mild regurgitation. ------------------------------------------------------------ Aorta: Aortic root: The aortic root was normal in size. ------------------------------------------------------------ Mitral valve: Mildly calcified leaflets . Mobility was not restricted. Doppler: Transvalvular velocity was within  the normal range. There was no evidence for stenosis. Mild to moderate regurgitation. ------------------------------------------------------------ Left atrium: The atrium was severely dilated. ------------------------------------------------------------ Right ventricle: The cavity size was dilated. Wall thickness could not be accurately determined. Pacer wire or catheter noted in right ventricle. Systolic function was normal. ------------------------------------------------------------ Pulmonic valve: Not well visualized. The valve appears to be grossly normal. Doppler: Transvalvular velocity was within the normal range. There was no evidence for stenosis. ------------------------------------------------------------ Tricuspid valve: Structurally normal valve. Doppler: Transvalvular velocity was within the normal range. Mild regurgitation. ------------------------------------------------------------ Pulmonary artery: The main pulmonary artery was normal-sized. Systolic pressure was moderately increased. ------------------------------------------------------------ Right atrium: The atrium was moderately dilated. ------------------------------------------------------------ Pericardium: There was no pericardial effusion. ------------------------------------------------------------ Systemic veins: Inferior vena cava: The vessel was dilated; the respirophasic diameter changes were blunted (< 50%).   Dg Chest 2 View  01/09/2014   CLINICAL DATA:  Cough, congestion, weakness  EXAM:  CHEST  2 VIEW  COMPARISON:  08/11/2013  FINDINGS: There is moderate enlargement of cardiac silhouette. Cardiac pacer is in unchanged position. There is moderate vascular congestion. There is peribronchial wall thickening. There is mild diffuse interstitial prominence. There are small bilateral pleural effusions.  IMPRESSION: Congestive heart failure with moderately severe pulmonary edema   Ct Head Wo  Contrast  01/11/2014   CLINICAL DATA:  Altered mental status.  EXAM: CT HEAD WITHOUT CONTRAST  TECHNIQUE: Contiguous axial images were obtained from the base of the skull through the vertex without intravenous contrast.  COMPARISON:  06/09/2009  FINDINGS: Right occipital encephalomalacia appears mildly progressed from the prior study with slightly increased surrounding hypoattenuation likely reflective of gliosis. There is no definite evidence of acute cortical infarct. Moderate cerebral atrophy is again seen. Remote lacunar infarcts are again noted in the bilateral thalamus. Periventricular white matter hypodensities are compatible with mild to moderate chronic small vessel ischemic disease. There is no evidence of mass, midline shift, intracranial hemorrhage, or extra-axial fluid collection. There is a small left mastoid effusion. There is complete opacification of the left frontal sinus. There is near complete opacification of the right frontal sinus, bilateral ethmoid air cells, and bilateral maxillary sinuses. There is also near complete opacification of the left sphenoid sinus with possible fluid level.  IMPRESSION: 1. Progressive right occipital encephalomalacia. No evidence of acute intracranial abnormality. 2. New, near complete diffuse paranasal sinus opacification, suggestive of acute sinusitis.    ASSESSMENT AND PLAN AYAZ SONDGEROTH is a 79 y.o. male with a history of worsening dementia, CAD/CABG, atrial fib/flutter on coumadin, SSS s/p pacemaker placement, DM, HIV on HAART and chronic mixed systolic and diastolic HF who was admitted from his nursing home on 01/09/14 for weakness, SOB and an abnormal CXR.    Acute on chronic combined systolic and diastolic congestive heart failure  -- CXR consistent with pulmonary vascular congestion, proBNP > 40000. -- ECHO on 01/10/14 reveals reduced EF from last ECHO ( 45 --> 35 %.) Stage 3 diastolic dysfunction. Mild mod MR and AI. Dr. Percival Spanish does not think  that treatment with inotropes would help this situation long term.  -- neg 1.9L, weight down 20lbs since admission  (seems inaccurate)  Chronic kidney disease stage 4  Progressive renal insufficiency with diuresis. Also, Na increased. Dr. Percival Spanish suggested that we stop Lasix as options are very limited. Code status needs to be addressed and palliative care has seen.   -- baseline creat (1.7-1.9). 2.16 today.   Hypernatremia:  -- Unclear etiology.  -- Continue to hold Lasix  -- Monitory B-met   -- Avoid IV fluids in the setting of acute  heart failure.   Afib/flutter- continue Coumadin per pharmacy  Signed, Perry Mount PA-C  Pager [phone]  Patient seen and examined and history reviewed. Agree with above findings and plan. Mr. Abdulaziz is well known to me. I have cared for him for many years. I have not seen him in over 2 years. It is remarkable how much his health has deteriorated since I last saw him. He is arousable and oriented to self. Unable to give any reliable history. Does not appear dyspneic at rest. Currently lasix on hold due to hypernatremia and elevated creatinine. His weight is down since admission but I doubt by 20 Lbs. Suspect some component of malnutrition. Awaiting palliative care evaluation to clarify goals of treatment.   Collier [facility] 01/12/2014 10:57 AM

## 2014-01-13 DIAGNOSIS — I2510 Atherosclerotic heart disease of native coronary artery without angina pectoris: Secondary | ICD-10-CM

## 2014-01-13 DIAGNOSIS — E43 Unspecified severe protein-calorie malnutrition: Secondary | ICD-10-CM | POA: Insufficient documentation

## 2014-01-13 LAB — GLUCOSE, CAPILLARY
GLUCOSE-CAPILLARY: 109 mg/dL — AB (ref 70–99)
Glucose-Capillary: 92 mg/dL (ref 70–99)
Glucose-Capillary: 206 mg/dL — ABNORMAL HIGH (ref 70–99)
Glucose-Capillary: 213 mg/dL — ABNORMAL HIGH (ref 70–99)

## 2014-01-13 LAB — BASIC METABOLIC PANEL
BUN: 46 mg/dL — AB (ref 6–23)
CHLORIDE: 115 meq/L — AB (ref 96–112)
CO2: 26 meq/L (ref 19–32)
Calcium: 8.8 mg/dL (ref 8.4–10.5)
Creatinine, Ser: 2.21 mg/dL — ABNORMAL HIGH (ref 0.50–1.35)
GFR calc Af Amer: 32 mL/min — ABNORMAL LOW (ref >90)
GFR calc non Af Amer: 27 mL/min — ABNORMAL LOW (ref >90)
GLUCOSE: 159 mg/dL — AB (ref 70–99)
POTASSIUM: 3.8 meq/L (ref 3.7–5.3)
Sodium: 154 mEq/L — ABNORMAL HIGH (ref 137–147)

## 2014-01-13 LAB — CBC
HEMATOCRIT: 29.8 % — AB (ref 39.0–52.0)
HEMOGLOBIN: 8.7 g/dL — AB (ref 13.0–17.0)
MCH: 23.6 pg — ABNORMAL LOW (ref 26.0–34.0)
MCHC: 29.2 g/dL — ABNORMAL LOW (ref 30.0–36.0)
MCV: 81 fL (ref 78.0–100.0)
Platelets: 111 10*3/uL — ABNORMAL LOW (ref 150–400)
RBC: 3.68 MIL/uL — AB (ref 4.22–5.81)
RDW: 23.8 % — ABNORMAL HIGH (ref 11.5–15.5)
WBC: 6.5 10*3/uL (ref 4.0–10.5)

## 2014-01-13 LAB — URINE CULTURE: Colony Count: 7000

## 2014-01-13 LAB — PROTIME-INR
INR: 1.84 — AB (ref 0.00–1.49)
Prothrombin Time: 20.7 seconds — ABNORMAL HIGH (ref 11.6–15.2)

## 2014-01-13 MED ORDER — WARFARIN SODIUM 4 MG PO TABS
4.0000 mg | ORAL_TABLET | Freq: Once | ORAL | Status: AC
  Administered 2014-01-13: 4 mg via ORAL
  Filled 2014-01-13: qty 1

## 2014-01-13 NOTE — Progress Notes (Signed)
Speech Language Pathology Treatment: Dysphagia  Patient Details Name: [name] Proctor MRN: [id number] DOB: 05/19/1935 Today's Date: 01/13/2014 Time: 1440-1500 SLP Time Calculation (min): 20 min  Assessment / Plan / Recommendation Clinical Impression  Per RN pt is eating more per meal, and appears to be tolerating puree and nectar consistencies.  Some difficulty with meds that cannot be crushed per RN.  Recommended trial of alternate puree (pudding or peanut butter) with these meds.  Pt given sips of Ensure via straw. No overt s/s aspiration immediately following the swallow, however, pt was noted to exhibit intermittent congested cough. RN reported pt had been doing this. Will continue to monitor diet tolerance.  [facility]   HPI HPI: 79 yo male, [facility], with multiple and very complex medical problems including systolic CHF per last 2 D ECHO 06/2012 with EF 45%, atrial fibrillation on chronic Coumadin, diabetes mellitus requiring insulin, HTN, HIV on HAART, [facility] due to concern of abnormal CXR obtained at the facility 01/08/2013. Please note that pt is unable to provide much history and no family at bedside. Most of the information obtained from available records and ED physician.    Pertinent Vitals VSS  SLP Plan  Continue with current plan of care    Recommendations Diet recommendations: Dysphagia 1 (puree);Nectar-thick liquid Liquids provided via: Straw;Cup Medication Administration: Whole meds with puree Supervision: Staff to assist with self feeding;Full supervision/cueing for compensatory strategies Compensations: Slow rate;Small sips/bites;Follow solids with liquid Postural Changes and/or Swallow Maneuvers: Seated upright 90 degrees;Upright 30-60 min after meal              Oral Care Recommendations: Oral care BID Follow up Recommendations: Skilled Nursing facility Plan:  Continue with current plan of care    GO    Celia B. Murvin NatalBueche, [facility]P, CCC-SLP [phone]  Leigh AuroraBueche, Celia Brown 01/13/2014, 3:04 PM

## 2014-01-13 NOTE — Progress Notes (Signed)
Progress Note [facility] Team [facility]  Subjective: I spoke with [name] and [name] Proctor today. [name] is more alert but unable to contribute to decision making at this time. He acknowledges that [name] Proctor his making good decisions for him and appears pleased by nodding "yes" and smiling. [name] is more alert and eating a little more today so I believe he would be appropriate to [facility]. I discussed this with [name] Proctor and agree that we should determine payer source [facility] so that we can try and get hospice involved in his care. Hospice can also help them decide when the right time to transition to residential hospice may be as they desire that type of care when the time comes. [name] Proctor wants a more comfort path to be followed as they discharge from the hospital. [name] Proctor understands his underlying kidney disease and that his kidneys may worsen (especially with Lasix) - he has opted for dialysis not to be done now or in the future. We will continue comfort feeds.    Objective: Allergies  Allergen Reactions  . Darvocet [Propoxyphene N-Acetaminophen] Nausea And Vomiting  . Seldane [Terfenadine] Nausea And Vomiting  . Sulfonylureas Other (See Comments)    Low grade fever  . Ultracet [Tramadol-Acetaminophen] Nausea And Vomiting   Scheduled Meds: . allopurinol  250 mg Oral Daily  . aspirin EC  81 mg Oral Daily  . atorvastatin  10 mg Oral QHS  . carvedilol  12.5 mg Oral BID WC  . darunavir  600 mg Oral BID WC  . enoxaparin (LOVENOX) injection  1 mg/kg Subcutaneous QHS  . etravirine  200 mg Oral BID WC  . feeding supplement (ENSURE)  1 Container Oral TID BM  . hydrALAZINE  10 mg Oral Q8H  . insulin aspart  0-9 Units Subcutaneous TID WC  . insulin NPH Human  5 Units Subcutaneous QHS  . levothyroxine  75 mcg Oral QAC breakfast  . pantoprazole  40 mg Oral Daily  . predniSONE  7.5 mg Oral Q breakfast  . ritonavir  100 mg Oral BID WC  . senna-docusate  2  tablet Oral BID  . sodium chloride  3 mL Intravenous Q12H  . sulfamethoxazole-trimethoprim  1 tablet Oral Q M,W,F  . Warfarin - Pharmacist Dosing Inpatient   Does not apply q1800   Continuous Infusions:  PRN Meds:.sodium chloride, ondansetron (ZOFRAN) IV, ondansetron, oxyCODONE, RESOURCE THICKENUP CLEAR, sodium chloride  BP 131/74  Pulse 68  Temp(Src) 98.1 F (36.7 C) (Oral)  Resp 17  Ht 6' (1.829 m)  Wt 65.7 kg (144 lb 13.5 oz)  BMI 19.64 kg/m2  SpO2 100%   PPS: 30%   Intake/Output Summary (Last 24 hours) at 01/13/14 1353 Last data filed at 01/13/14 1046  Gross per 24 hour  Intake    403 ml  Output    300 ml  Net    103 ml      LBM: 01/13/14      Physical Exam:  General: NAD, more alert but sleepy HEENT:  [place]/AT, no JVD Chest: CTA throughout CVS: RRR, S1 S2, pacemaker Abdomen: Soft, NT, ND, +BS Ext: No edema, dry skin Neuro: Alert to person only (knows friend [name] Proctor - in room)  Labs: CBC    Component Value Date/Time   WBC 6.5 01/13/2014 0356   RBC 3.68* 01/13/2014 0356   RBC 3.56* 01/10/2014 1159   HGB 8.7* 01/13/2014 0356   HCT 29.8* 01/13/2014 0356  PLT 111* 01/13/2014 0356   MCV 81.0 01/13/2014 0356   MCH 23.6* 01/13/2014 0356   MCHC 29.2* 01/13/2014 0356   RDW 23.8* 01/13/2014 0356   LYMPHSABS 2.4 01/09/2014 1407   MONOABS 0.7 01/09/2014 1407   EOSABS 0.1 01/09/2014 1407   BASOSABS 0.0 01/09/2014 1407    BMET    Component Value Date/Time   NA 154* 01/13/2014 0356   K 3.8 01/13/2014 0356   CL 115* 01/13/2014 0356   CO2 26 01/13/2014 0356   GLUCOSE 159* 01/13/2014 0356   BUN 46* 01/13/2014 0356   CREATININE 2.21* 01/13/2014 0356   CALCIUM 8.8 01/13/2014 0356   GFRNONAA 27* 01/13/2014 0356   GFRAA 32* 01/13/2014 0356    CMP     Component Value Date/Time   NA 154* 01/13/2014 0356   K 3.8 01/13/2014 0356   CL 115* 01/13/2014 0356   CO2 26 01/13/2014 0356   GLUCOSE 159* 01/13/2014 0356   BUN 46* 01/13/2014 0356   CREATININE 2.21* 01/13/2014 0356   CALCIUM 8.8  01/13/2014 0356   PROT 6.3 01/09/2014 1407   ALBUMIN 2.3* 01/09/2014 1407   AST 20 01/09/2014 1407   ALT 10 01/09/2014 1407   ALKPHOS 283* 01/09/2014 1407   BILITOT 1.4* 01/09/2014 1407   GFRNONAA 27* 01/13/2014 0356   GFRAA 32* 01/13/2014 0356    Assessment and Plan: 1. Code Status: DNR 2. Symptom Control: 1. Pain: Oxycodone IR prn. 2. Bowel Regimen: Senna S scheduled. 3. Nausea/Vomiting: Ondansetron prn.  3. Psycho/Social: Emotional support provided to patient and friend [name] Proctor.  4. Spiritual: Chaplains following.  5. Disposition: [name] Proctor with hospice or palliative care.    Time In Time Out Total Time Spent with Patient Total Overall Time  1340 1405     Greater than 50%  of this time was spent counseling and coordinating care related to the above assessment and plan.  Yong Channel, NP Palliative Medicine Team Team Phone # [phone]   1

## 2014-01-13 NOTE — Progress Notes (Signed)
Chaplain received consult to visit pt.  Chaplain offered emotional support by sitting alongside pt and inviting pt to engage in conversation.  Chaplain asked pt to let chaplain know (through the nurse) if pt would like to see a chaplain again.   01/13/14 1400  Clinical Encounter Type  Visited With Patient  Visit Type Spiritual support    Rulon Abideavid B Sherrod, chaplain pager [phone]

## 2014-01-13 NOTE — Progress Notes (Signed)
Pt alert to self, pt had improvement in diet today, no c/o pain, vss, pt stable

## 2014-01-13 NOTE — Progress Notes (Signed)
[facility] PROGRESS NOTE Interim History: 79 yo male, resident of [facility], with multiple and very complex medical problems including systolic CHF per last 2 D ECHO 06/2012 with EF 45%, atrial fibrillation on chronic Coumadin, diabetes mellitus requiring insulin, HTN, HIV on HAART, who was transferred to [facility] ED from [facility] by EMS due to concern of abnormal CXR obtained at the facility 01/08/2013. Patient now steadily declining and unable to take care of himself or provide any info. Patient kidney function, electrolytes, nutrition and ability to treat exacerbation CHF very limited. Waiting final decision on [place], my recommendations are for hospice and comfort.  Filed Weights   01/11/14 0458 01/12/14 0600 01/13/14 0618  Weight: 66.4 kg (146 lb 6.2 oz) 64.9 kg (143 lb 1.3 oz) 65.7 kg (144 lb 13.5 oz)        Intake/Output Summary (Last 24 hours) at 01/13/14 2142 Last data filed at 01/13/14 1735  Gross per 24 hour  Intake    643 ml  Output    300 ml  Net    343 ml     Assessment/Plan: Acute on chronic combined systolic and diastolic congestive heart failure - Weight has improved from 74 on admission to 66 Kg. ( baseline cr around 1.7-1.9).   -Appreciate cardiology help.  - Echo decrease in Ef from 45 to 35 %.  - Sodium continue to rise, now 154. Lasix discontinue 01-11-2014. Remained on hold. -limited treatment for heart failure, see cardiology note. -Appreciate palliative Care evaluation. Patient now DNR. -Further discussion for goals of care to follow.   Hypernatremia: - Unclear etiology.  - lasix remain on hold -Repeated B-met with Na 154  -Will avoid IV fluids due to heart failure.  -Encourage oral intake. (which patient did a little better)  Acute Thrombocytopenia /  Anemia: - MCV 80, RDW 23.9. He is on antiretroviral medications. - No sign of bleeding. Hbg at baseline. -RBC folate pending, B-12 684  and ferritin 131. -Stable. -no  signs of overt bleeding Depending GOC decision will stop anticoagulation and HAART therapy.  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 Unable to contact family.  - CT head: Progressive right occipital encephalomalacia. No evidence of acute intracranial abnormality. -Unable to get MRI, patient has pace-maker.   Long term (current) use of anticoagulants - INR sub-therapeutic. Coumadin per pharmacy.  -once GOC decided will opted to stop anticoagulation  Hypothyroidism - TSH slightly elevated, repeat in 6 weeks. If GOC is for hospice and comfort, no need to adjust meds or to continue synthroid.  HIV disease - continue HAART: Prezista, Intelence, Ritonavir, Epivir   Chronic kidney disease stage 4 - basleine (1.7-1.9). -currently stable  Type 2 diabetes mellitus with vascular disease - moderate control.  Code Status: DNR Family Communication: no family at bedside  Disposition Plan: remain inpatient.    Consultants:  Cardiology  Palliative care  Procedures: ECHO: Left ventricle: The cavity size was normal. Systolic function was moderately to severely reduced. The estimated ejection fraction was in the range of 30% to 35%. Moderate diffuse hypokinesis. Doppler parameters are consistent with a reversible restrictive pattern, indicative of decreased left ventricular diastolic compliance and/or increased left atrial pressure (grade 3 diastolic dysfunction). - Aortic valve: Mild regurgitation. - Mitral valve: Mild to moderate regurgitation. - Left atrium: The atrium was severely dilated. - Right ventricle: The cavity size was dilated. Wall thickness could not be accurately determined. -  Right atrium: The atrium was moderately dilated. - Pulmonary arteries: Systolic pressure was moderately increased. PA peak pressure: 43m Hg (S).   Antibiotics: none  Prezista, Intelence,  Ritonavir, Epivir   HPI/Subjective: Alert, pleasantly confused, no CP, no fever  Objective: Filed Vitals:   01/12/14 2002 01/13/14 0033 01/13/14 0618 01/13/14 1358  BP: 118/53 125/64 131/74 132/66  Pulse: 62 69 68 68  Temp: 98.3 F (36.8 C)  98.1 F (36.7 C) 98.5 F (36.9 C)  TempSrc: Oral  Oral Oral  Resp: _0 Height:      Weight:   65.7 kg (144 lb 13.5 oz)   SpO2: 94%  100% 95%     Exam:  General: in no acute distress. confused HEENT: No bruits, no goiter. + JVD Heart: Regular rate, without murmurs, rubs, gallops.  Lungs: decrease air movement, crackles at bases. Abdomen: Soft, nontender, nondistended, positive bowel sounds.    Data Reviewed: Basic Metabolic Panel:  Recent Labs Lab 01/09/14 1407 01/09/14 2040 01/10/14 0400 01/11/14 0558 01/12/14 0400 01/13/14 0356  NA 150*  --  151* 153* 154* 154*  K 4.1  --  4.1 3.8 3.9 3.8  CL 112  --  114* 111 114* 115*  CO2 26  --  _1 GLUCOSE 157*  --  87 87 83 159*  BUN 44*  --  43* 42* 43* 46*  CREATININE 1.98*  --  1.93* 2.16* 2.16* 2.21*  CALCIUM 8.7  --  9.0 8.9 8.8 8.8  MG  --  2.2  --   --   --   --   PHOS  --  2.7  --   --   --   --    Liver Function Tests:  Recent Labs Lab 01/09/14 1407  AST 20  ALT 10  ALKPHOS 283*  BILITOT 1.4*  PROT 6.3  ALBUMIN 2.3*   CBC:  Recent Labs Lab 01/09/14 1407 01/10/14 0400 01/12/14 0400 01/13/14 0356  WBC 6.4 7.7 6.9 6.5  NEUTROABS 3.2  --   --   --   HGB 9.0* 9.0* 9.2* 8.7*  HCT 31.1* 30.8* 31.8* 29.8*  MCV 81.6 80.0 81.1 81.0  PLT 96* 101* 107* 111*   Cardiac Enzymes:  Recent Labs Lab 01/09/14 1424  TROPONINI <0.30   BNP (last 3 results)  Recent Labs  01/09/14 1424 01/09/14 2040  PROBNP 41214.0* 38688.0*   CBG:  Recent Labs Lab 01/12/14 1619 01/12/14 2124 01/13/14 0645 01/13/14 1030 01/13/14 1635  GLUCAP 195* 132* 92 206* 213*    Recent Results (from the past 240 hour(s))  URINE CULTURE     Status: None    Collection Time    01/09/14 10:14 PM      Result Value Range Status   Specimen Description URINE, CATHETERIZED   Final   Special Requests NONE   Final   Culture  Setup Time     Final   Value: 01/10/2014 11:35     Performed at [email or web address]     Final   Value: 7,000 COLONIES/ML     Performed at [facility]  Culture     Final   Value: ENTEROCOCCUS SPECIES     Performed at [facility]  Report Status 01/13/2014 FINAL   Final   Organism ID, Bacteria ENTEROCOCCUS SPECIES   Final  MRSA PCR SCREENING     Status: None   Collection Time  01/09/14 10:15 PM      Result Value Range Status   MRSA by PCR NEGATIVE  NEGATIVE Final   Comment:            The GeneXpert MRSA Assay (FDA     approved for NASAL specimens     only), is one component of a     comprehensive MRSA colonization     surveillance program. It is not     intended to diagnose MRSA     infection nor to guide or     monitor treatment for     MRSA infections.     Studies: No results found.  Scheduled Meds: . allopurinol  250 mg Oral Daily  . aspirin EC  81 mg Oral Daily  . atorvastatin  10 mg Oral QHS  . carvedilol  12.5 mg Oral BID WC  . darunavir  600 mg Oral BID WC  . enoxaparin (LOVENOX) injection  1 mg/kg Subcutaneous QHS  . etravirine  200 mg Oral BID WC  . feeding supplement (ENSURE)  1 Container Oral TID BM  . hydrALAZINE  10 mg Oral Q8H  . insulin aspart  0-9 Units Subcutaneous TID WC  . insulin NPH Human  5 Units Subcutaneous QHS  . levothyroxine  75 mcg Oral QAC breakfast  . pantoprazole  40 mg Oral Daily  . predniSONE  7.5 mg Oral Q breakfast  . ritonavir  100 mg Oral BID WC  . senna-docusate  2 tablet Oral BID  . sodium chloride  3 mL Intravenous Q12H  . sulfamethoxazole-trimethoprim  1 tablet Oral Q M,W,F  . warfarin  4 mg Oral ONCE-1800  . Warfarin - Pharmacist Dosing Inpatient   Does not apply q1800   Continuous Infusions:    Lash Matulich  [facility] Pager [phone] If 8PM-8AM, please contact night-coverage at [email or web address], password [email or web address] 01/13/2014, 9:42 PM  LOS: 4 days

## 2014-01-13 NOTE — Progress Notes (Signed)
Subjective: Alert to person, but not place or time. States that he is not in any pain and denies SOB.   Objective: Vital signs in last 24 hours: Temp:  [98.1 F (36.7 C)-98.3 F (36.8 C)] 98.1 F (36.7 C) (01/20 0618) Pulse Rate:  [62-69] 68 (01/20 0618) Resp:  [17-18] 17 (01/20 0618) BP: (108-131)/(53-74) 131/74 mmHg (01/20 0618) SpO2:  [94 %-100 %] 100 % (01/20 0618) Weight:  [144 lb 13.5 oz (65.7 kg)] 144 lb 13.5 oz (65.7 kg) (01/20 0618) Last BM Date: 01/12/14  Intake/Output from previous day: 01/19 0701 - 01/20 0700 In: 220 [P.O.:220] Out: 400 [Urine:400] Intake/Output this shift: Total I/O In: 120 [P.O.:120] Out: -   Medications Current Facility-Administered Medications  Medication Dose Route Frequency Provider Last Rate Last Dose  . 0.9 %  sodium chloride infusion  250 mL Intravenous PRN Dorothea Ogle, MD      . allopurinol (ZYLOPRIM) tablet 250 mg  250 mg Oral Daily Dorothea Ogle, MD   250 mg at 01/12/14 1100  . aspirin EC tablet 81 mg  81 mg Oral Daily Dorothea Ogle, MD   81 mg at 01/12/14 1101  . atorvastatin (LIPITOR) tablet 10 mg  10 mg Oral QHS Dorothea Ogle, MD   10 mg at 01/13/14 0007  . carvedilol (COREG) tablet 12.5 mg  12.5 mg Oral BID WC Dorothea Ogle, MD   12.5 mg at 01/13/14 5784  . darunavir (PREZISTA) tablet 600 mg  600 mg Oral BID WC Dorothea Ogle, MD   600 mg at 01/13/14 6962  . enoxaparin (LOVENOX) injection 70 mg  1 mg/kg Subcutaneous QHS Severiano Gilbert, RPH   70 mg at 01/13/14 0009  . etravirine (INTELENCE) tablet 200 mg  200 mg Oral BID WC Anabel Bene, RPH   200 mg at 01/13/14 0008  . feeding supplement (ENSURE) (ENSURE) pudding 1 Container  1 Container Oral TID BM Lorraine Lax, RD   1 Container at 01/12/14 2000  . hydrALAZINE (APRESOLINE) tablet 10 mg  10 mg Oral Q8H Dorothea Ogle, MD   10 mg at 01/13/14 9528  . insulin aspart (novoLOG) injection 0-9 Units  0-9 Units Subcutaneous TID WC Dorothea Ogle, MD   2 Units at 01/12/14 1734  .  insulin NPH Human (HUMULIN N,NOVOLIN N) injection 5 Units  5 Units Subcutaneous QHS Dorothea Ogle, MD   5 Units at 01/13/14 0019  . levothyroxine (SYNTHROID, LEVOTHROID) tablet 75 mcg  75 mcg Oral QAC breakfast Dorothea Ogle, MD   75 mcg at 01/13/14 [phone]  . ondansetron (ZOFRAN) tablet 4 mg  4 mg Oral Q6H PRN Dorothea Ogle, MD       Or  . ondansetron [facility]) injection 4 mg  4 mg Intravenous Q6H PRN Dorothea Ogle, MD      . oxyCODONE (Oxy IR/ROXICODONE) immediate release tablet 5 mg  5 mg Oral Q4H PRN Dorothea Ogle, MD      . pantoprazole (PROTONIX) EC tablet 40 mg  40 mg Oral Daily Dorothea Ogle, MD   40 mg at 01/12/14 1102  . predniSONE (DELTASONE) tablet 7.5 mg  7.5 mg Oral Q breakfast Dorothea Ogle, MD   7.5 mg at 01/13/14 4401  . RESOURCE THICKENUP CLEAR   Oral PRN Riley Nearing Deblois, CCC-SLP      . ritonavir (NORVIR) tablet 100 mg  100 mg Oral BID WC Dorothea Ogle, MD  100 mg at 01/13/14 [phone]  . senna-docusate (Senokot-S) tablet 2 tablet  2 tablet Oral BID Dorothea OgleIskra M Myers, MD   2 tablet at 01/13/14 0008  . sodium chloride 0.9 % injection 3 mL  3 mL Intravenous Q12H Dorothea OgleIskra M Myers, MD   3 mL at 01/13/14 0008  . sodium chloride 0.9 % injection 3 mL  3 mL Intravenous PRN Dorothea OgleIskra M Myers, MD      . sulfamethoxazole-trimethoprim (BACTRIM DS) 800-160 MG per tablet 1 tablet  1 tablet Oral Q M,W,F Dorothea OgleIskra M Myers, MD   1 tablet at 01/12/14 1101  . Warfarin - Pharmacist Dosing Inpatient   Does not apply q1800 Anabel BeneSendra Yang, [facility]atosRPH        PE: General appearance: alert, cooperative and no distress Lungs: clear to auscultation bilaterally Heart: regular rate and rhythm Extremities: no LEE Pulses: 2+ and symmetric Skin: warm and dry Neurologic: Mental status: dementia at baseline  Lab Results:   Recent Labs  01/12/14 0400 01/13/14 0356  WBC 6.9 6.5  HGB 9.2* 8.7*  HCT 31.8* 29.8*  PLT 107* 111*   BMET  Recent Labs  01/11/14 0558 01/12/14 0400 01/13/14 0356  NA 153* 154* 154*  K 3.8 3.9  3.8  CL 111 114* 115*  CO2 26 24 26   GLUCOSE 87 83 159*  BUN 42* 43* 46*  CREATININE 2.16* 2.16* 2.21*  CALCIUM 8.9 8.8 8.8   PT/INR  Recent Labs  01/11/14 0558 01/12/14 0400 01/13/14 0356  LABPROT 20.4* 20.2* 20.7*  INR 1.80* 1.78* 1.84*    Assessment/Plan  Principal Problem:   Acute on chronic combined systolic and diastolic congestive heart failure Active Problems:   Coronary artery disease   Chronic kidney disease stage 4   Type 2 diabetes mellitus with vascular disease   HIV disease   Hypothyroidism   Anemia   Long term (current) use of anticoagulants   Thrombocytopenia   Hypernatremia   Altered mental state   Palliative care encounter   Protein-calorie malnutrition, severe  Plan: No change in sodium levels. Na+ remains elevated at 154. Renal function also continues to decline. SCr increased to 2.21 (2.16 yesterday). Continue to hold Lasix, in setting of hypernatremia and a/c CKD . Based on Palliative Care meeting with his POA yesterday, it seems like future goals of care will focus more on comfort measures. Code Status is DNR. MD to follow.     LOS: 4 days    Brittainy M. Delmer IslamSimmons, PA-C 01/13/2014 8:25 AM  Patient seen and examined and history reviewed. Agree with above findings and plan. Arouses to name but falls asleep easily. Denies any pain but moans wherever he is touched. Overall condition appears unchanged. Weight is stable. No edema. +JVD. Lungs with bibasilar crackles. Overall prognosis is poor. Appreciate palliative care directives. Plan is to continue current therapy but he is a DNR. I agree with Hospice consult. Would continue to hold Lasix for now.  Theron Aristaeter [facility] Medical CenterJordanMD,FACC 01/13/2014 9:18 AM

## 2014-01-13 NOTE — Progress Notes (Signed)
This assessment was done at 800. Student was not able to access EPIC before 1200.

## 2014-01-13 NOTE — Progress Notes (Signed)
ANTICOAGULATION CONSULT NOTE - Follow Up Consult  Pharmacy Consult:  Lovenox / Coumadin Indication: atrial fibrillation  Patient Measurements: Height: 6' (182.9 cm) Weight: 144 lb 13.5 oz (65.7 kg) IBW/kg (Calculated) : 77.6  Vital Signs: Temp: 98.5 F (36.9 C) (01/20 1358) Temp src: Oral (01/20 1358) BP: 132/66 mmHg (01/20 1358) Pulse Rate: 68 (01/20 1358)  Labs:  Recent Labs  01/11/14 0558 01/12/14 0400 01/13/14 0356  HGB  --  9.2* 8.7*  HCT  --  31.8* 29.8*  PLT  --  107* 111*  LABPROT 20.4* 20.2* 20.7*  INR 1.80* 1.78* 1.84*  CREATININE 2.16* 2.16* 2.21*    Estimated Creatinine Clearance: 26.4 ml/min (by C-G formula based on Cr of 2.21).  Assessment: 79 [facility] with weakness, increasing shortness of breath, and abnormal CXR. Patient is on chronic Coumadin for Afib, per facility records, he had been receiving SQ Lovenox injections.    INR remains sub-therapeutic after 2.5 mg then 4 mg of Coumadin the last 2 days. Low platelet count; no bleeding reported.  Continues on Septra, which could significantly increase his sensitivity to Coumadin.  Goal of Therapy:  INR 2-3 Anti-Xa level 0.6-1 units/ml 4hrs after LMWH dose given Monitor platelets by anticoagulation protocol: Yes   Plan:   Repeat Coumadin 4mg  today.  Continue Lovenox 70mg  SQ Q24hrs.  Daily PT / INR.  CBC at least Q72H while on Lovenox; will check in am.  Nicolette Bangheresa Sybella Harnish, RPh Pager: [phone] 01/13/2014, 4:19 PM

## 2014-01-14 LAB — GLUCOSE, CAPILLARY
Glucose-Capillary: 116 mg/dL — ABNORMAL HIGH (ref 70–99)
Glucose-Capillary: 198 mg/dL — ABNORMAL HIGH (ref 70–99)

## 2014-01-14 LAB — CBC
HCT: 30.2 % — ABNORMAL LOW (ref 39.0–52.0)
HEMOGLOBIN: 8.7 g/dL — AB (ref 13.0–17.0)
MCH: 23.6 pg — ABNORMAL LOW (ref 26.0–34.0)
MCHC: 28.8 g/dL — ABNORMAL LOW (ref 30.0–36.0)
MCV: 82.1 fL (ref 78.0–100.0)
PLATELETS: 118 10*3/uL — AB (ref 150–400)
RBC: 3.68 MIL/uL — AB (ref 4.22–5.81)
RDW: 23.8 % — ABNORMAL HIGH (ref 11.5–15.5)
WBC: 7.1 10*3/uL (ref 4.0–10.5)

## 2014-01-14 LAB — PROTIME-INR
INR: 1.94 — ABNORMAL HIGH (ref 0.00–1.49)
PROTHROMBIN TIME: 21.6 s — AB (ref 11.6–15.2)

## 2014-01-14 MED ORDER — WARFARIN SODIUM 4 MG PO TABS: 4.0000 mg | ORAL_TABLET | Freq: Every day | ORAL | Status: AC

## 2014-01-14 NOTE — Progress Notes (Signed)
I have reviewed and discussed the care of this patient in detail with the nurse practitioner including pertinent patient records, physical exam findings and data. I agree with details of this encounter.  

## 2014-01-14 NOTE — Consult Note (Signed)
I have reviewed and discussed the care of this patient in detail with the nurse practitioner including pertinent patient records, physical exam findings and data. I agree with details of this encounter.  

## 2014-01-14 NOTE — Progress Notes (Signed)
Patient Name: [name] Proctor Date of Encounter: 01/14/2014     Principal Problem:   Acute on chronic combined systolic and diastolic congestive heart failure Active Problems:   Coronary artery disease   Chronic kidney disease stage 4   Type 2 diabetes mellitus with vascular disease   HIV disease   Hypothyroidism   Anemia   Long term (current) use of anticoagulants   Thrombocytopenia   Hypernatremia   Altered mental state   Palliative care encounter   Protein-calorie malnutrition, severe   SUBJECTIVE  Patient seems much better today. He is in NAD while the nurse spoons him puree. He seems comfortable but moans when you touch him for physical exam. He denies any pain. He is not oriented to time or place. He was able to recall the name of his friend, Karren Burly.    CURRENT MEDS . allopurinol  250 mg Oral Daily  . aspirin EC  81 mg Oral Daily  . atorvastatin  10 mg Oral QHS  . carvedilol  12.5 mg Oral BID WC  . darunavir  600 mg Oral BID WC  . enoxaparin (LOVENOX) injection  1 mg/kg Subcutaneous QHS  . etravirine  200 mg Oral BID WC  . feeding supplement (ENSURE)  1 Container Oral TID BM  . hydrALAZINE  10 mg Oral Q8H  . insulin aspart  0-9 Units Subcutaneous TID WC  . insulin NPH Human  5 Units Subcutaneous QHS  . levothyroxine  75 mcg Oral QAC breakfast  . pantoprazole  40 mg Oral Daily  . predniSONE  7.5 mg Oral Q breakfast  . ritonavir  100 mg Oral BID WC  . senna-docusate  2 tablet Oral BID  . sodium chloride  3 mL Intravenous Q12H  . sulfamethoxazole-trimethoprim  1 tablet Oral Q M,W,F  . Warfarin - Pharmacist Dosing Inpatient   Does not apply q1800    OBJECTIVE  Filed Vitals:   01/13/14 0618 01/13/14 1358 01/13/14 2205 01/14/14 0513  BP: 131/74 132/66 135/69 130/67  Pulse: 68 68 67 70  Temp: 98.1 F (36.7 C) 98.5 F (36.9 C) 98.3 F (36.8 C) 98 F (36.7 C)  TempSrc: Oral Oral Oral Oral  Resp: 17 18 18 18   Height:      Weight: 144 lb 13.5 oz (65.7 kg)    144 lb 6.4 oz (65.5 kg)  SpO2: 100% 95% 98% 100%    Intake/Output Summary (Last 24 hours) at 01/14/14 0805 Last data filed at 01/14/14 0514  Gross per 24 hour  Intake    643 ml  Output    850 ml  Net   -207 ml   Filed Weights   01/12/14 0600 01/13/14 0618 01/14/14 0513  Weight: 143 lb 1.3 oz (64.9 kg) 144 lb 13.5 oz (65.7 kg) 144 lb 6.4 oz (65.5 kg)    PHYSICAL EXAM\ General appearance: alert, cooperative and no distress  Lungs: bibasilar crackles Heart: regular rate and rhythm +JVD Extremities: no LEE  Pulses: 2+ and symmetric  Skin: warm and dry  Neurologic: Mental status: dementia at baseline   Accessory Clinical Findings  CBC  Recent Labs  01/13/14 0356 01/14/14 0534  WBC 6.5 7.1  HGB 8.7* 8.7*  HCT 29.8* 30.2*  MCV 81.0 82.1  PLT 111* 118*   Basic Metabolic Panel  Recent Labs  01/12/14 0400 01/13/14 0356  NA 154* 154*  K 3.9 3.8  CL 114* 115*  CO2 24 26  GLUCOSE 83 159*  BUN 43* 46*  CREATININE 2.16* 2.21*  CALCIUM 8.8 8.8    TELE   Ventricular paced rhythm HR 60s    Radiology/Studies  ECHO: 01/10/2014 ------------------------------------------------ LV EF: 30% - 35% ------------------------------------------------------------ Indications: Dyspnea 786.09. ------------------------------------------------------------ Study Conclusions - Left ventricle: The cavity size was normal. Systolic function was moderately to severely reduced. The estimated ejection fraction was in the range of 30% to 35%. Moderate diffuse hypokinesis. Doppler parameters are consistent with a reversible restrictive pattern, indicative of decreased left ventricular diastolic compliance and/or increased left atrial pressure (grade 3 diastolic dysfunction). - Aortic valve: Mild regurgitation. - Mitral valve: Mild to moderate regurgitation. - Left atrium: The atrium was severely dilated. - Right ventricle: The cavity size was dilated. Wall thickness could not be  accurately determined. - Right atrium: The atrium was moderately dilated. - Pulmonary arteries: Systolic pressure was moderately increased. PA peak pressure: 50mm Hg (S). ------------------------------------------------------------ Labs, prior tests, procedures, and surgery: Permanent pacemaker system implantation. ------------------------------------------------------------ Left ventricle: The cavity size was normal. Systolic function was moderately to severely reduced. The estimated ejection fraction was in the range of 30% to 35%. Moderate diffuse hypokinesis. Doppler parameters are consistent with a reversible restrictive pattern, indicative of decreased left ventricular diastolic compliance and/or increased left atrial pressure (grade 3 diastolic dysfunction). ------------------------------------------------------------ Aortic valve: Trileaflet; normal thickness leaflets. Mobility was not restricted. Doppler: Transvalvular velocity was within the normal range. There was no stenosis. Mild regurgitation. ------------------------------------------------------------ Aorta: Aortic root: The aortic root was normal in size. ------------------------------------------------------------ Mitral valve: Mildly calcified leaflets . Mobility was not restricted. Doppler: Transvalvular velocity was within the normal range. There was no evidence for stenosis. Mild to moderate regurgitation. ------------------------------------------------------------ Left atrium: The atrium was severely dilated. ------------------------------------------------------------ Right ventricle: The cavity size was dilated. Wall thickness could not be accurately determined. Pacer wire or catheter noted in right ventricle. Systolic function was normal. ------------------------------------------------------------ Pulmonic valve: Not well visualized. The valve appears to be grossly normal. Doppler: Transvalvular  velocity was within the normal range. There was no evidence for stenosis. ------------------------------------------------------------ Tricuspid valve: Structurally normal valve. Doppler: Transvalvular velocity was within the normal range. Mild regurgitation. ------------------------------------------------------------ Pulmonary artery: The main pulmonary artery was normal-sized. Systolic pressure was moderately increased. ------------------------------------------------------------ Right atrium: The atrium was moderately dilated. ------------------------------------------------------------ Pericardium: There was no pericardial effusion. ------------------------------------------------------------ Systemic veins: Inferior vena cava: The vessel was dilated; the respirophasic diameter changes were blunted (< 50%).    Dg Chest 2 View  01/09/2014 CLINICAL DATA: Cough, congestion, weakness EXAM: CHEST 2 VIEW COMPARISON: 08/11/2013 FINDINGS: There is moderate enlargement of cardiac silhouette. Cardiac pacer is in unchanged position. There is moderate vascular congestion. There is peribronchial wall thickening. There is mild diffuse interstitial prominence. There are small bilateral pleural effusions. IMPRESSION: Congestive heart failure with moderately severe pulmonary edema    Ct Head Wo Contrast  01/11/2014 CLINICAL DATA: Altered mental status. EXAM: CT HEAD WITHOUT CONTRAST TECHNIQUE: Contiguous axial images were obtained from the base of the skull through the vertex without intravenous contrast. COMPARISON: 06/09/2009 FINDINGS: Right occipital encephalomalacia appears mildly progressed from the prior study with slightly increased surrounding hypoattenuation likely reflective of gliosis. There is no definite evidence of acute cortical infarct. Moderate cerebral atrophy is again seen. Remote lacunar infarcts are again noted in the bilateral thalamus. Periventricular white matter hypodensities are  compatible with mild to moderate chronic small vessel ischemic disease. There is no evidence of mass, midline shift, intracranial hemorrhage, or extra-axial fluid collection. There is a small left mastoid effusion. There is complete opacification of the  left frontal sinus. There is near complete opacification of the right frontal sinus, bilateral ethmoid air cells, and bilateral maxillary sinuses. There is also near complete opacification of the left sphenoid sinus with possible fluid level. IMPRESSION: 1. Progressive right occipital encephalomalacia. No evidence of acute intracranial abnormality. 2. New, near complete diffuse paranasal sinus opacification, suggestive of acute sinusitis.     ASSESSMENT AND PLAN  [name] Proctor is a 79 y.o. male with a history of worsening dementia, CAD/CABG, atrial fib/flutter on coumadin, SSS s/p pacemaker placement, DM, HIV on HAART and chronic mixed systolic and diastolic HF who was admitted from his nursing home on 01/09/14 for weakness, SOB and an abnormal CXR.   Acute on chronic combined systolic and diastolic congestive heart failure  -- CXR consistent with pulmonary vascular congestion, proBNP > 40,000.  -- ECHO on 01/10/14 reveals reduced EF from last ECHO ( 45 --> 35 %.) Stage 3 diastolic dysfunction. Mild mod MR and AI.  -- neg 2.3L, weight down (inaccurate readings)   Chronic kidney disease stage 4 --progressive renal insufficiency with diuresis.  -- Baseline creat (1.7-1.9). 2.16 yesterday. Pending BMET today  Hypernatremia: Na remains increased ~154.  -- Unclear etiology.  -- Continue to hold Lasix and avoiding fluid in the setting of heart failure. Continue to monitor -- BMET pending today   Afib/flutter- continue Coumadin per pharmacy  Palliative care directives-- DNR and spiritual care -- Pain: Oxycodone IR prn.  -- Bowel Regimen: Senna S daily. -- Nausea/Vomiting: Ondansetron prn.   Urban Gibson PA-C  Pager [phone]  Patient  seen and examined and history reviewed. Agree with above findings and plan. Mr. Klees is a little bit more alert today. Oriented to self. Seems to recognize me but can't recall my name. Denies any SOB or chest pain.  Weight is stable despite holding diuretics. Prominent V wave on JV pulse. Bibasilar crackles with poor respiratory drive. Would continue to hold diuretics now. Could use prn for increased dyspnea. Quality of life is very poor and plan for Hospice involvement.  Theron Arista [facility] 01/14/2014 9:13 AM

## 2014-01-14 NOTE — Progress Notes (Signed)
I spoke with [name] this morning who is more alert but drifts to sleep after a few exchanges of conversation (he answers yes or no when able). Very limited interaction. I called [name] Proctor and he is good with the plan to return to [name] Proctor and I explained for him to advocate for palliative care to follow him and that he can advocate for hospice to follow when Medicare is no longer his pay source for [name] Proctor. [name] Proctor understands and is appreciative of care. [name] Proctor and [name] Proctor DNR are on chart for discharge. Discussed plan with Dr. David StallFeliz Ortiz.  Yong ChannelAlicia Gillian Meeuwsen, NP Palliative Medicine Team Team Phone # [phone]

## 2014-01-14 NOTE — Progress Notes (Signed)
D/C Tele, D/C IV, D/C paperwork given to paramedics [facility] pt[facility], I called report to receiving RN at the facility and advised her that pt.'s meds should be crushed and given with pudding or applesauce because pt. Was unable to swallow them whole with the pudding for his morning meds. Pt. Left Unit via Stretcher via EMS. Pt. Displayed no signs or symptoms of distress or discomfort.

## 2014-01-14 NOTE — Discharge Summary (Addendum)
Physician Discharge Summary  [name] Proctor ZOX:[id number] DOB: [birth year] DOA: 01/09/2014  PCP: Pcp Not In System  Admit date: 01/09/2014 Discharge date: 01/14/2014  Time spent: 40 minutes  Recommendations for Outpatient Follow-up:  1. Follow up with PMT at facility  Discharge Diagnoses:  Principal Problem:   Acute on chronic combined systolic and diastolic congestive heart failure Active Problems:   Coronary artery disease   Chronic kidney disease stage 4   Type 2 diabetes mellitus with vascular disease   HIV disease   Hypothyroidism   Anemia   Long term (current) use of anticoagulants   Thrombocytopenia   Hypernatremia   Altered mental state   Palliative care encounter   Protein-calorie malnutrition, severe   Discharge Condition: stable  Diet recommendation: heart healthy  Filed Weights   01/12/14 0600 01/13/14 0618 01/14/14 0513  Weight: 64.9 kg (143 lb 1.3 oz) 65.7 kg (144 lb 13.5 oz) 65.5 kg (144 lb 6.4 oz)    History of present illness:  79 yo male, resident of [facility], with multiple and very complex medical problems including systolic CHF per last 2 D ECHO 06/2012 with EF 45%, atrial fibrillation on chronic Coumadin, diabetes mellitus requiring insulin, HTN, HIV on HAART, who was transferred to [facility] ED from [facility] by EMS due to concern of abnormal CXR obtained at the facility 01/08/2013. Please note that pt is unable to provide much history and no family at bedside. Most of the information obtained from available records and ED physician. There has been report of productive cough, progressive deconditioning, poor oral intake, failure to thrive. No reported abdominal or urinary concerns.    Hospital Course:  Acute on chronic combined systolic and diastolic congestive heart failure - Weight has improved from 74 on admission to 65 Kg. ( baseline cr around 1.7-1.9). Was started on IV lasix - Consulted cardiology. - Echo decrease  in Ef from 45 to 35 %.  - Sodium continue to rise, now 154 with diuresis. Lasix discontinue 01-11-2014. And weight cont to improve. - Limited treatment for heart failure. - Appreciate palliative Care evaluation. Patient now DNR. -Further discussion for goals of care to follow.   Hypernatremia:  - due to heart failure. - with poor oral intake. - held diuretics.   Acute Thrombocytopenia / Anemia:  - MCV 80, RDW 23.9. He is on antiretroviral medications.  - No sign of bleeding. Hbg at baseline.  -RBC folate pending, B-12 684 and ferritin 131.  -Stable.  -no signs of overt bleeding   Altered mental state/ encephalopathy  - Multifactorial due to hypernatremia and acute illness, and probably HIV dementia; unclear baseline. Unable to contact family.  - CT head: Progressive right occipital encephalomalacia. No evidence of acute intracranial abnormality.  -Unable to get MRI, patient has pace-maker.   Long term (current) use of anticoagulants  - INR sub-therapeutic. Cont coumadin.  Hypothyroidism  - TSH slightly elevated, repeat in 6 weeks.   HIV disease  - continue HAART: Prezista, Intelence, Ritonavir, Epivir   Chronic kidney disease stage 4  - basleine (1.7-1.9).  -currently stable   Type 2 diabetes mellitus with vascular disease  - moderate control.   Code Status: DNR  Family Communication: no family at bedside  Disposition Plan: remain inpatient.   Consultants:  Cardiology  Palliative care  Procedures:  ECHO: Left ventricle: The cavity size was normal. Systolic function was moderately to severely reduced. The estimated ejection fraction was in the range of 30% to  35%. Moderate diffuse hypokinesis. Doppler parameters are consistent with a reversible restrictive pattern, indicative of decreased left ventricular diastolic compliance and/or increased left atrial pressure (grade 3 diastolic dysfunction).  - Pulmonary arteries: Systolic pressure was moderately increased. PA  peak pressure: 50mm Hg (S).    Consultations:  Cardiology  PMT  Discharge Exam: Filed Vitals:   01/14/14 1300  BP: 140/72  Pulse: 74  Temp: 97.8 F (36.6 C)  Resp: 18    General: in no acute distress  Discharge Instructions      Discharge Orders   Future Orders Complete By Expires   Diet - low sodium heart healthy  As directed    Increase activity slowly  As directed        Medication List    STOP taking these medications       atorvastatin 10 MG tablet  Commonly known as:  LIPITOR     CALCIUM 600-D 600-400 MG-UNIT per tablet  Generic drug:  Calcium Carbonate-Vitamin D     ENOXAPARIN SODIUM IJ     furosemide 40 MG tablet  Commonly known as:  LASIX      TAKE these medications       allopurinol 100 MG tablet  Commonly known as:  ZYLOPRIM  Take 250 mg by mouth daily.     aspirin EC 81 MG tablet  Take 81 mg by mouth daily.     carvedilol 25 MG tablet  Commonly known as:  COREG  Take 12.5 mg by mouth 2 (two) times daily with a meal.     darunavir 600 MG tablet  Commonly known as:  PREZISTA  Take 600 mg by mouth 2 (two) times daily.     etravirine 100 MG tablet  Commonly known as:  INTELENCE  Take 200 mg by mouth 2 (two) times daily with a meal.     ferrous sulfate 325 (65 FE) MG tablet  Take 325 mg by mouth 2 (two) times daily with a meal.     hydrALAZINE 10 MG tablet  Commonly known as:  APRESOLINE  Take 10 mg by mouth every 8 (eight) hours.     insulin NPH Human 100 UNIT/ML injection  Commonly known as:  HUMULIN N,NOVOLIN N  Inject 5 Units into the skin at bedtime.     insulin regular 100 units/mL injection  Commonly known as:  NOVOLIN R,HUMULIN R  Inject 3 Units into the skin 3 (three) times daily before meals.     levothyroxine 75 MCG tablet  Commonly known as:  SYNTHROID, LEVOTHROID  Take 75 mcg by mouth daily before breakfast.     omeprazole 20 MG capsule  Commonly known as:  PRILOSEC  Take 20 mg by mouth 2 (two) times daily.      PREDNISONE PO  Take 7.5 mg by mouth daily.     ritonavir 100 MG Tabs tablet  Commonly known as:  NORVIR  Take 100 mg by mouth 2 (two) times daily.     senna-docusate 8.6-50 MG per tablet  Commonly known as:  Senokot-S  Take 2 tablets by mouth 2 (two) times daily.     sulfamethoxazole-trimethoprim 800-160 MG per tablet  Commonly known as:  BACTRIM DS  Take 1 tablet by mouth every Monday, Wednesday, and Friday.     warfarin 4 MG tablet  Commonly known as:  COUMADIN  Take 1 tablet (4 mg total) by mouth daily.       Allergies  Allergen Reactions  . Darvocet [Propoxyphene N-Acetaminophen]  Nausea And Vomiting  . Seldane [Terfenadine] Nausea And Vomiting  . Sulfonylureas Other (See Comments)    Low grade fever  . Ultracet [Tramadol-Acetaminophen] Nausea And Vomiting   Follow-up Information   Follow up with Pcp Not In System.       The results of significant diagnostics from this hospitalization (including imaging, microbiology, ancillary and laboratory) are listed below for reference.    Significant Diagnostic Studies: Dg Chest 2 View  01/09/2014   CLINICAL DATA:  Cough, congestion, weakness  EXAM: CHEST  2 VIEW  COMPARISON:  08/11/2013  FINDINGS: There is moderate enlargement of cardiac silhouette. Cardiac pacer is in unchanged position. There is moderate vascular congestion. There is peribronchial wall thickening. There is mild diffuse interstitial prominence. There are small bilateral pleural effusions.  IMPRESSION: Congestive heart failure with moderately severe pulmonary edema   Electronically Signed   By: Esperanza Heir M.D.   On: 01/09/2014 14:58   Ct Head Wo Contrast  01/11/2014   CLINICAL DATA:  Altered mental status.  EXAM: CT HEAD WITHOUT CONTRAST  TECHNIQUE: Contiguous axial images were obtained from the base of the skull through the vertex without intravenous contrast.  COMPARISON:  06/09/2009  FINDINGS: Right occipital encephalomalacia appears mildly progressed  from the prior study with slightly increased surrounding hypoattenuation likely reflective of gliosis. There is no definite evidence of acute cortical infarct. Moderate cerebral atrophy is again seen. Remote lacunar infarcts are again noted in the bilateral thalamus. Periventricular white matter hypodensities are compatible with mild to moderate chronic small vessel ischemic disease. There is no evidence of mass, midline shift, intracranial hemorrhage, or extra-axial fluid collection. There is a small left mastoid effusion. There is complete opacification of the left frontal sinus. There is near complete opacification of the right frontal sinus, bilateral ethmoid air cells, and bilateral maxillary sinuses. There is also near complete opacification of the left sphenoid sinus with possible fluid level.  IMPRESSION: 1. Progressive right occipital encephalomalacia. No evidence of acute intracranial abnormality. 2. New, near complete diffuse paranasal sinus opacification, suggestive of acute sinusitis.   Electronically Signed   By: Sebastian Ache   On: 01/11/2014 12:13    Microbiology: Recent Results (from the past 240 hour(s))  URINE CULTURE     Status: None   Collection Time    01/09/14 10:14 PM      Result Value Range Status   Specimen Description URINE, CATHETERIZED   Final   Special Requests NONE   Final   Culture  Setup Time     Final   Value: 01/10/2014 11:35     Performed at [facility] Count     Final   Value: 7,000 COLONIES/ML     Performed at [facility]   Culture     Final   Value: ENTEROCOCCUS SPECIES     Performed at [facility]   Report Status 01/13/2014 FINAL   Final   Organism ID, Bacteria ENTEROCOCCUS SPECIES   Final  MRSA PCR SCREENING     Status: None   Collection Time    01/09/14 10:15 PM      Result Value Range Status   MRSA by PCR NEGATIVE  NEGATIVE Final   Comment:            The GeneXpert MRSA Assay (FDA     approved for NASAL  specimens     only), is one component of a     comprehensive MRSA colonization  surveillance program. It is not     intended to diagnose MRSA     infection nor to guide or     monitor treatment for     MRSA infections.     Labs: Basic Metabolic Panel:  Recent Labs Lab 01/09/14 1407 01/09/14 2040 01/10/14 0400 01/11/14 0558 01/12/14 0400 01/13/14 0356  NA 150*  --  151* 153* 154* 154*  K 4.1  --  4.1 3.8 3.9 3.8  CL 112  --  114* 111 114* 115*  CO2 26  --  25 26 24 26   GLUCOSE 157*  --  87 87 83 159*  BUN 44*  --  43* 42* 43* 46*  CREATININE 1.98*  --  1.93* 2.16* 2.16* 2.21*  CALCIUM 8.7  --  9.0 8.9 8.8 8.8  MG  --  2.2  --   --   --   --   PHOS  --  2.7  --   --   --   --    Liver Function Tests:  Recent Labs Lab 01/09/14 1407  AST 20  ALT 10  ALKPHOS 283*  BILITOT 1.4*  PROT 6.3  ALBUMIN 2.3*   No results found for this basename: LIPASE, AMYLASE,  in the last 168 hours No results found for this basename: AMMONIA,  in the last 168 hours CBC:  Recent Labs Lab 01/09/14 1407 01/10/14 0400 01/12/14 0400 01/13/14 0356 01/14/14 0534  WBC 6.4 7.7 6.9 6.5 7.1  NEUTROABS 3.2  --   --   --   --   HGB 9.0* 9.0* 9.2* 8.7* 8.7*  HCT 31.1* 30.8* 31.8* 29.8* 30.2*  MCV 81.6 80.0 81.1 81.0 82.1  PLT 96* 101* 107* 111* 118*   Cardiac Enzymes:  Recent Labs Lab 01/09/14 1424  TROPONINI <0.30   BNP: BNP (last 3 results)  Recent Labs  01/09/14 1424 01/09/14 2040  PROBNP 41214.0* 38688.0*   CBG:  Recent Labs Lab 01/13/14 1030 01/13/14 1635 01/13/14 2208 01/14/14 0631 01/14/14 1106  GLUCAP 206* 213* 109* 116* 198*       Signed:  David StallFELIZ ORTIZ, ABRAHAM  [facility] 01/14/2014, 2:59 PM

## 2014-01-16 ENCOUNTER — Non-Acute Institutional Stay (SKILLED_NURSING_FACILITY): Payer: Medicare Other | Admitting: Internal Medicine

## 2014-01-16 DIAGNOSIS — I509 Heart failure, unspecified: Secondary | ICD-10-CM

## 2014-01-16 DIAGNOSIS — E870 Hyperosmolality and hypernatremia: Secondary | ICD-10-CM

## 2014-01-16 DIAGNOSIS — N184 Chronic kidney disease, stage 4 (severe): Secondary | ICD-10-CM

## 2014-01-16 NOTE — Progress Notes (Signed)
Patient ID: [name] Proctor, male   DOB: 1935-06-05, 79 y.o.   MRN: [id number] Facility; [name] Chief complaint readmission to this facility post stay at home health 1/16 through 1/20 History; this is a patient admitted to the facility on 1/5 . He is a frail man who came to was I believe from the [facility] [place]. Among his many problems he had chronic renal failure, congestive heart failure with known coronary artery disease status post stent placement. He was sent to the hospital out of concerns for congestive heart failure. Last echocardiogram was done in 2013 showed an ejection fraction of 45% with atrial fibrillation on chronic Coumadin. The patient was at had a repeat echocardiogram done showing the decrease in EF from 45-35%. He was started on IV Lasix however his sodium rose to a maximum of 154. He was seen by palliative care. 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 CT scan of the head was done which showed progressive right occipital lobe encephalomalacia without evidence of an acute abnormality. An MRI was unable to be obtained as the patient has a pacemaker  Results for OLIN, GURSKI (MRN [id number]) as of 01/16/2014 16:54  Ref. Range 01/11/2014 22:53 01/12/2014 04:00 01/12/2014 06:11 01/12/2014 11:08 01/12/2014 16:19 01/12/2014 21:24 01/13/2014 03:56 01/13/2014 06:45 01/13/2014 10:30 01/13/2014 16:35 01/13/2014 22:08 01/14/2014 05:34 01/14/2014 06:31 01/14/2014 11:06  Glucose-Capillary Latest Range: 70-99 mg/dL 96  83 782 (H) 956 (H) 132 (H)  92 206 (H) 213 (H) 109 (H)  116 (H) 198 (H)  Sodium Latest Range: 137-147 mEq/L  154 (H)     154 (H)         Potassium Latest Range: 3.7-5.3 mEq/L  3.9     3.8         Chloride Latest Range: 96-112 mEq/L  114 (H)     115 (H)         CO2 Latest Range: 19-32 mEq/L  24     26         BUN Latest Range: 6-23 mg/dL   43 (H)     46 (H)         Creatinine Latest Range: 0.50-1.35 mg/dL  2.13 (H)     0.86 (H)         Calcium Latest Range: 8.4-10.5 mg/dL  8.8     8.8         GFR calc non Af Amer Latest Range: >90 mL/min  28 (L)     27 (L)         GFR calc Af Amer Latest Range: >90 mL/min  32 (L)     32 (L)         Glucose Latest Range: 70-99 mg/dL  83     578 (H)         WBC Latest Range: 4.0-10.5 K/uL  6.9     6.5     7.1    RBC Latest Range: 4.22-5.81 MIL/uL  3.92 (L)     3.68 (L)     3.68 (L)    Hemoglobin Latest Range: 13.0-17.0 g/dL  9.2 (L)     8.7 (L)     8.7 (L)    HCT Latest Range: 39.0-52.0 %  31.8 (L)     29.8 (L)     30.2 (L)    MCV Latest Range:  78.0-100.0 fL  81.1     81.0     82.1    MCH Latest Range: 26.0-34.0 pg  23.5 (L)     23.6 (L)     23.6 (L)    MCHC Latest Range: 30.0-36.0 g/dL  16.1 (L)     09.6 (L)     28.8 (L)    RDW Latest Range: 11.5-15.5 %  24.0 (H)     23.8 (H)     23.8 (H)    Platelets Latest Range: 150-400 K/uL  107 (L)     111 (L)     118 (L)    Prothrombin Time Latest Range: 11.6-15.2 seconds  20.2 (H)     20.7 (H)     21.6 (H)    INR Latest Range: 0.00-1.49   1.78 (H)     1.84 (H)     1.94 (H)         CLINICAL DATA:  Cough, congestion, weakness   EXAM: CHEST  2 VIEW   COMPARISON:  08/11/2013   FINDINGS: There is moderate enlargement of cardiac silhouette. Cardiac pacer is in unchanged position. There is moderate vascular congestion. There is peribronchial wall thickening. There is mild diffuse interstitial prominence. There are small bilateral pleural effusions.   IMPRESSION: Congestive heart failure with moderately severe pulmonary edema       Contiguous axial images were obtained from the base of the skull through the vertex without intravenous contrast.   COMPARISON:  06/09/2009   FINDINGS: Right occipital encephalomalacia appears mildly progressed from the prior study with slightly increased surrounding hypoattenuation likely reflective of gliosis.  There is no definite evidence of acute cortical infarct. Moderate cerebral atrophy is again seen. Remote lacunar infarcts are again noted in the bilateral thalamus. Periventricular white matter hypodensities are compatible with mild to moderate chronic small vessel ischemic disease. There is no evidence of mass, midline shift, intracranial hemorrhage, or extra-axial fluid collection. There is a small left mastoid effusion. There is complete opacification of the left frontal sinus. There is near complete opacification of the right frontal sinus, bilateral ethmoid air cells, and bilateral maxillary sinuses. There is also near complete opacification of the left sphenoid sinus with possible fluid level.   IMPRESSION: 1. Progressive right occipital encephalomalacia. No evidence of acute intracranial abnormality. 2. New, near complete diffuse paranasal sinus opacification, suggestive of acute sinusitis.                          [place], [place] [address]                         [phone]   ------------------------------------------------------------ Transthoracic Echocardiography  (Report amended )  Patient:    [name] Proctor MR #:       [id number] Study Date: 01/10/2014 Gender:     M Age:        64 Height:     182.9cm Weight:     67.1kg BSA:        1.54m^2 Pt. Status: Room:       3E22C    ATTENDING    Marinda Elk  SONOGRAPHER  [place], RDCS, CCT  ADMITTING    Izola Price, Iskra  Kathrynn Ducking, Iskra  PERFORMING   Chmg, Inpatient cc:  ------------------------------------------------------------ LV EF: 30% -   35%  ------------------------------------------------------------ Indications:      Dyspnea 786.09.  ------------------------------------------------------------ History:   PMH:  Coronary artery disease.  Congestive heart failure.  Risk factors:  Altered mental status. Chronic kidney disease. HIV. Sick sinus syndrome. Diabetes  mellitus. Dyslipidemia.  ------------------------------------------------------------ Study Conclusions  - Left ventricle: The cavity size was normal. Systolic   function was moderately to severely reduced. The estimated   ejection fraction was in the range of 30% to 35%. Moderate   diffuse hypokinesis. Doppler parameters are consistent   with a reversible restrictive pattern, indicative of   decreased left ventricular diastolic compliance and/or   increased left atrial pressure (grade 3 diastolic   dysfunction). - Aortic valve: Mild regurgitation. - Mitral valve: Mild to moderate regurgitation. - Left atrium: The atrium was severely dilated. - Right ventricle: The cavity size was dilated. Wall   thickness could not be accurately determined. - Right atrium: The atrium was moderately dilated. - Pulmonary arteries: Systolic pressure was moderately   increased. PA peak pressure: 50mm Hg (S).  ------------------------------------------------------------ Labs, prior tests, procedures, and surgery: Permanent pacemaker system implantation.  Transthoracic echocardiography.  M-mode, complete 2D, spectral Doppler, and color Doppler.  Height:  Height: 182.9cm. Height: 72in.  Weight:  Weight: 67.1kg. Weight: 147.7lb.  Body mass index:  BMI: 20.1kg/m^2.  Body surface area:    BSA: 1.5236m^2.  Blood pressure:     105/54.  Patient status:  Inpatient.  Location:  Bedside.  ------------------------------------------------------------  ------------------------------------------------------------ Left ventricle:  The cavity size was normal. Systolic function was moderately to severely reduced. The estimated ejection fraction was in the range of 30% to 35%.  Moderate diffuse hypokinesis. Doppler parameters are consistent with a reversible restrictive pattern, indicative of decreased left ventricular diastolic compliance and/or increased left atrial pressure (grade 3 diastolic  dysfunction).  ------------------------------------------------------------ Aortic valve:   Trileaflet; normal thickness leaflets. Mobility was not restricted.  Doppler:  Transvalvular velocity was within the normal range. There was no stenosis.  Mild regurgitation.  ------------------------------------------------------------ Aorta:  Aortic root: The aortic root was normal in size.  ------------------------------------------------------------ Mitral valve:   Mildly calcified leaflets . Mobility was not restricted.  Doppler:  Transvalvular velocity was within the normal range. There was no evidence for stenosis.  Mild to moderate regurgitation.  ------------------------------------------------------------ Left atrium:  The atrium was severely dilated.  ------------------------------------------------------------ Right ventricle:  The cavity size was dilated. Wall thickness could not be accurately determined. Pacer wire or catheter noted in right ventricle. Systolic function was normal.  ------------------------------------------------------------ Pulmonic valve:   Not well visualized.  The valve appears to be grossly normal.    Doppler:  Transvalvular velocity was within the normal range. There was no evidence for stenosis.   ------------------------------------------------------------ Tricuspid valve:   Structurally normal valve.    Doppler: Transvalvular velocity was within the normal range.  Mild regurgitation.  ------------------------------------------------------------ Pulmonary artery:   The main pulmonary artery was normal-sized. Systolic pressure was moderately increased.   ------------------------------------------------------------ Right atrium:  The atrium was moderately dilated.  ------------------------------------------------------------ Pericardium:  There was no pericardial effusion.  ------------------------------------------------------------ Systemic  veins: Inferior vena cava: The vessel was dilated; the respirophasic diameter changes were blunted (< 50%).   has a past medical history of CHF (congestive heart failure); History of diastolic dysfunction; SSS (sick sinus syndrome); Coronary artery disease; History of atrial flutter; Chronic renal insufficiency; Diabetes mellitus type 2, insulin dependent; History of GI bleed; Gastritis; Esophagitis; Duodenal ulcer; IDA (iron deficiency anemia); Hypertension; Hyperlipidemia; Hypothyroidism; HIV positive; RAS (renal artery stenosis); pacemaker-MDT; and Atrial fibrillation.    Review of systems not really possible.  Physical exam This  is a very frail man his speech is dysarthric he does not appear to be acutely ill Respiratory shallow but otherwise clear entry no crackles or wheezes his work of breathing is normal Cardiac heart sounds are normal he appears to be somewhat dehydrated Abdomen no liver no spleen no tenderness no masses GU question urinary retention with suprapubic tenderness and fullness no CVA tenderness Extremities significant proximal upper and lower extremity weakness which is no different from one I saw him and admitted him to the facility previously. He had been previously diagnosed as having a peripheral neuropathy.  Impression/plan #1 diagnosed with congestive heart failure in the hospital however it is very odd to have congestive heart failure in the face of hypernatremia which in itself usually reflects volumes of free fluid deficient. I reviewed his chest x-ray which didn't appear does show some degree of interstitial changes. Nonetheless I'm not convinced of the heart failure. He is not currently on diuretics #2 discharged with a sodium of 154 which I also fine very difficult to understand.  #3 chronic renal failure stage IV #4 ? Urinary retention #5 protein calorie malnutrition nutrition severe although I don't see a serum albumin. #6 HIV #7 type 2 diabetes not on any  current treatment #8 severe peripheral neuropathy diagnosed previously at the [place] he has had EMG and nerve conduction studies I don't have these results  #9 on chronic Coumadin although I don't specifically see orders for Coumadin coming out of the hospital. Nevertheless it is been reinitiated and I may not have the correct set of orders

## 2014-01-17 NOTE — Clinical Social Work Psychosocial (Addendum)
    Clinical Social Work Department BRIEF PSYCHOSOCIAL ASSESSMENT 01/17/2014  Patient:  [name] Proctor,[name] Proctor     Account Number:  [id number]     Admit date:  01/09/2014  Clinical Social Worker:  Tiburcio PeaROWDER,Coy Rochford, LCSWA  Date/Time:  01/12/2014 01:00 AM  Referred by:  Physician  Date Referred:  01/12/2014 Referred for  SNF Placement   Other Referral:   Return to SNF   Interview type:  Other - See comment Other interview type:   Patient and friend [name]    PSYCHOSOCIAL DATA Living Status:  FACILITY Admitted from [facility] Level of care:  Skilled Nursing Facility Primary support name:  Hunt OrisDwight  Proctor  [phone] Primary support relationship to patient:  FRIEND Degree of support available:   Strong support    CURRENT CONCERNS Current Concerns  Other - See comment   Other Concerns:   Return to SNF    SOCIAL WORK ASSESSMENT / PLAN 79 year old male- resident of [facility]. CSW spoke to [place], [facility]- they will accept pateint back when medically stable.  Patient does not speak much but stated "yes" when asked if he wanted to return to the facility when stable.  Fl2 on chart for MD's signature.   Assessment/plan status:  Psychosocial Support/Ongoing Assessment of Needs Other assessment/ plan:   Information/referral to community resources:   None at this time    PATIENT'S/FAMILY'S RESPONSE TO PLAN OF CARE: Patient is alert and is agreeable to return to facility when stable.  He is not very responsive but would state "yes" about return to facility.  Patient denies having any family; his friend Karren BurlyDwight helps him out with things and he trusts him. CSW will assist  patient return to facility when medically stable.  Lorri Frederickonna T. Mike Berntsen, LCSWA  [phone]

## 2014-01-17 NOTE — Progress Notes (Signed)
OK per MD for d/c today back [facility] via [facility]. Palliative care to follow patient at the facility.  Ok per patient, and friend's Karren BurlyDwight is aware of d/c. Nursing notified and will call report.  No further CSW needs identified. CSW signing off.  Lorri Frederickonna T. [place] PughCrowder, LCSWA  [phone]

## 2014-01-20 ENCOUNTER — Non-Acute Institutional Stay (SKILLED_NURSING_FACILITY): Payer: Medicare Other | Admitting: Internal Medicine

## 2014-01-20 DIAGNOSIS — E870 Hyperosmolality and hypernatremia: Secondary | ICD-10-CM

## 2014-01-20 DIAGNOSIS — I509 Heart failure, unspecified: Secondary | ICD-10-CM

## 2014-01-20 NOTE — Progress Notes (Signed)
Patient ID: Gean BirchwoodWilliam E Borton, male   DOB: [birth year], 79 y.o.   MRN: [id number] [facility] Chief complaint; sodium of 158 History; this is a very frail man that I admitted to this building earlier this month 2. He has a cardiomyopathy, renal insufficiency atrial fibrillation. He was recently in the hospital and diagnosed with congestive heart failure. He was diuresed with IV Lasix for his diuretics were discontinued with a sodium at 154. Surprisingly he was sent back to the facility with a sodium level in that range in the hospital. This was repeated yesterday at 158 with a BUN of 29 a creatinine of 1.92 his potassium is 5.1 an x-ray in the facility on 1/2 showed marked congestive heart failure  Past Medical History  Diagnosis Date  . CHF (congestive heart failure)     a. h/o diastolic dysfunction. b. EF 45% by echo 06/2012.  Marland Kitchen. History of diastolic dysfunction   . SSS (sick sinus syndrome)   . Coronary artery disease     s/p CABG 2010  . History of atrial flutter     s/p ablation  . Chronic renal insufficiency   . Diabetes mellitus type 2, insulin dependent   . History of GI bleed   . Gastritis   . Esophagitis   . Duodenal ulcer   . IDA (iron deficiency anemia)   . Hypertension   . Hyperlipidemia   . Hypothyroidism   . HIV positive   . RAS (renal artery stenosis)   . pacemaker-MDT     Medtronic EnRhythm generator serial [id number] H 2007 Dr. Reyes IvanKersey. [facility]     . Atrial fibrillation     By pacemaker interrogation 06/2012 - device reprogrammed    Medication list is reviewed; is not on diuretic  Physical examination Gen. the patient is surprisingly sitting up in bed Respiratory clear entry bilaterally Cardiac heart sounds are normal there is no clear evidence of congestive heart failure he does appear to be volume contracted although not to the degree that a sodium of 158 might suggest Abdomen no liver no spleen no tenderness of the  Impression/plan #1  hypernatremia. I would find it very difficult to believe that somebody with a sodium of 154-158 would be in congestive heart failure. I'm going to gently give him back some free fluid in the form of half normal saline. #2 ischemic cardiomyopathy I see no evidence of congestive heart failure at the bedside. I was not completely convinced of the radiology while he was in the hospital #3 type 2 diabetes blood sugars are well controlled on current insulin dosages #4 on prednisone at 7.5 mg must say the exact reason for this escapes me at the moment. I wonder whether this has to do with the possibility of immune mediated thrombocytopenia #5 chronic renal failure stage IV #6 palliative care issues. I see somebody is written for a palliative care consult however I am not completely certain where anyone is in this thought process

## 2014-01-23 ENCOUNTER — Non-Acute Institutional Stay (SKILLED_NURSING_FACILITY): Payer: Medicare Other | Admitting: Internal Medicine

## 2014-01-23 DIAGNOSIS — E870 Hyperosmolality and hypernatremia: Secondary | ICD-10-CM

## 2014-01-23 DIAGNOSIS — N184 Chronic kidney disease, stage 4 (severe): Secondary | ICD-10-CM

## 2014-01-23 DIAGNOSIS — I509 Heart failure, unspecified: Secondary | ICD-10-CM

## 2014-01-23 NOTE — Progress Notes (Signed)
Patient ID: [name] Proctor, male   DOB: 09-21-1935, 79 y.o.   MRN: [id number] Facility; [name] Chief complaint; Hypernatremia History; this is a very frail man that I admitted to this building earlier this month 2. He has a cardiomyopathy, renal insufficiency atrial fibrillation. He was recently in the hospital and diagnosed with congestive heart failure. He was diuresed with IV Lasix for his diuretics were discontinued with a sodium at 154. Surprisingly he was sent back to the facility with a sodium level in that range in the hospital. This was repeated yesterday at 158 with a BUN of 29 a creatinine of 1.92 his potassium is 5.1 an x-ray in the facility on 1/2 showed marked congestive heart failure. We have been giving him one half normal saline at 100 cc an hour since Tuesday. Lab work from yesterday showed a BUN of 26 a creatinine of 1.70 and a sodium of 148. His potassium is 4.7 estimated GFR of 38.  Nursing reports that he still has a very poor urine output perhaps 100 cc for this shift. He also has a poor oral intake.  Past Medical History  Diagnosis Date  . CHF (congestive heart failure)     a. h/o diastolic dysfunction. b. EF 45% by echo 06/2012.  Marland Kitchen History of diastolic dysfunction   . SSS (sick sinus syndrome)   . Coronary artery disease     s/p CABG 2010  . History of atrial flutter     s/p ablation  . Chronic renal insufficiency   . Diabetes mellitus type 2, insulin dependent   . History of GI bleed   . Gastritis   . Esophagitis   . Duodenal ulcer   . IDA (iron deficiency anemia)   . Hypertension   . Hyperlipidemia   . Hypothyroidism   . HIV positive   . RAS (renal artery stenosis)   . pacemaker-MDT     Medtronic EnRhythm generator serial [id number] H 2007 Dr. Reyes Ivan. Replaced at [facility] 2012     . Atrial fibrillation     By pacemaker interrogation 06/2012 - device reprogrammed    Medication list is reviewed; is not on diuretic  Physical examination temperature is 98.2  blood pressure 140/70 pulse 78 respirations 16 Gen. the patient is surprisingly sitting up in bed Respiratory decreased air entry over the left lower lobe shallow over the right Cardiac heart sounds are normal. His internal jugular vein is visible at 45 but not elevated. He has some degree of edema Abdomen no liver no spleen no tenderness.  Impression/plan #1 hypernatremia. I would find it very difficult to believe that somebody with a sodium of 154-158 would be in congestive heart failure. We have reduced his serum sodium to 148. There is some findings at the bedside did suggest he is better in terms of his intravascular volume. #2 ischemic cardiomyopathy I see no evidence of congestive heart failure at the bedside. I was not completely convinced of the radiology while he was in the hospital #3 type 2 diabetes blood sugars are well controlled on current insulin dosages #4 on prednisone at 7.5 mg must say the exact reason for this escapes me at the moment. I wonder whether this has to do with the possibility of immune mediated thrombocytopenia #5 chronic renal failure stage IV. Paradoxically his creatinine has actually gone up after the IV fluids from 1.59-1.70 #6 palliative care issues. I see somebody is written for a palliative care consult however I am not completely certain where  anyone is in this thought process. Patient has a [place] most form on the chart outlining comfort measures with no feeding tube. No hospitalizations #7 dysphagia the exact cause of this is not completely clear although he is still generally weak that this may be the primary issue  It is difficult to determine where exactly we are in his fluid balance. Although his sodium is improved. I will continue the liter of half-normal saline and then change him to D5W for 2 L after this time going to discontinue the IVs. I will repeat his lab work on Monday. I suspect this man is aspirating. He is probably not eating  adequately. End of life care would probably be in order here however I am not exactly sure where we are in this discussion and with whom

## 2014-01-29 ENCOUNTER — Encounter: Payer: Self-pay | Admitting: *Deleted

## 2014-02-22 DEATH — deceased
